# Patient Record
Sex: Female | Born: 1970 | Race: Black or African American | Hispanic: No | Marital: Married | Smoking: Never smoker
Health system: Southern US, Community
[De-identification: ages and names within clinical notes are randomized; demographics above are authoritative.]

## PROBLEM LIST (undated history)

## (undated) DIAGNOSIS — M1990 Unspecified osteoarthritis, unspecified site: Secondary | ICD-10-CM

## (undated) DIAGNOSIS — F419 Anxiety disorder, unspecified: Secondary | ICD-10-CM

## (undated) DIAGNOSIS — M719 Bursopathy, unspecified: Secondary | ICD-10-CM

## (undated) DIAGNOSIS — M545 Low back pain: Secondary | ICD-10-CM

## (undated) DIAGNOSIS — J189 Pneumonia, unspecified organism: Secondary | ICD-10-CM

## (undated) DIAGNOSIS — F32A Depression, unspecified: Secondary | ICD-10-CM

## (undated) DIAGNOSIS — M5430 Sciatica, unspecified side: Secondary | ICD-10-CM

## (undated) DIAGNOSIS — G8929 Other chronic pain: Secondary | ICD-10-CM

## (undated) DIAGNOSIS — F329 Major depressive disorder, single episode, unspecified: Secondary | ICD-10-CM

## (undated) HISTORY — PX: BACK SURGERY: SHX140

## (undated) HISTORY — PX: JOINT REPLACEMENT: SHX530

---

## 2004-06-28 ENCOUNTER — Ambulatory Visit: Payer: Self-pay | Admitting: Internal Medicine

## 2006-03-12 ENCOUNTER — Ambulatory Visit (HOSPITAL_COMMUNITY): Admission: RE | Admit: 2006-03-12 | Discharge: 2006-03-12 | Payer: Self-pay | Admitting: Obstetrics and Gynecology

## 2008-01-14 ENCOUNTER — Encounter: Admission: RE | Admit: 2008-01-14 | Discharge: 2008-01-14 | Payer: Self-pay | Admitting: Surgery

## 2008-12-21 ENCOUNTER — Inpatient Hospital Stay (HOSPITAL_COMMUNITY): Admission: AD | Admit: 2008-12-21 | Discharge: 2008-12-24 | Payer: Self-pay | Admitting: Obstetrics and Gynecology

## 2009-01-08 HISTORY — PX: APPENDECTOMY: SHX54

## 2009-12-07 ENCOUNTER — Ambulatory Visit (HOSPITAL_COMMUNITY)
Admission: EM | Admit: 2009-12-07 | Discharge: 2009-12-09 | Payer: Self-pay | Source: Home / Self Care | Admitting: Emergency Medicine

## 2009-12-07 ENCOUNTER — Ambulatory Visit (HOSPITAL_COMMUNITY)
Admission: RE | Admit: 2009-12-07 | Discharge: 2009-12-07 | Payer: Self-pay | Source: Home / Self Care | Admitting: Obstetrics and Gynecology

## 2009-12-08 ENCOUNTER — Encounter (INDEPENDENT_AMBULATORY_CARE_PROVIDER_SITE_OTHER): Payer: Self-pay | Admitting: General Surgery

## 2009-12-30 ENCOUNTER — Encounter
Admission: RE | Admit: 2009-12-30 | Discharge: 2009-12-30 | Payer: Self-pay | Source: Home / Self Care | Attending: General Surgery | Admitting: General Surgery

## 2010-01-29 ENCOUNTER — Encounter: Payer: Self-pay | Admitting: Obstetrics and Gynecology

## 2010-03-21 LAB — CBC
Hemoglobin: 12.9 g/dL (ref 12.0–15.0)
MCH: 33.9 pg (ref 26.0–34.0)
MCHC: 34 g/dL (ref 30.0–36.0)
MCV: 97.4 fL (ref 78.0–100.0)
MCV: 98.5 fL (ref 78.0–100.0)
Platelets: 317 10*3/uL (ref 150–400)
RBC: 3.8 MIL/uL — ABNORMAL LOW (ref 3.87–5.11)
RDW: 13 % (ref 11.5–15.5)
WBC: 10.7 10*3/uL — ABNORMAL HIGH (ref 4.0–10.5)

## 2010-03-21 LAB — URINE CULTURE
Colony Count: NO GROWTH
Culture  Setup Time: 201112011350
Special Requests: NEGATIVE

## 2010-03-21 LAB — URINALYSIS, ROUTINE W REFLEX MICROSCOPIC
Bilirubin Urine: NEGATIVE
Glucose, UA: NEGATIVE mg/dL
Ketones, ur: NEGATIVE mg/dL
Specific Gravity, Urine: 1.004 — ABNORMAL LOW (ref 1.005–1.030)
pH: 6.5 (ref 5.0–8.0)

## 2010-03-22 LAB — CBC
Hemoglobin: 15.3 g/dL — ABNORMAL HIGH (ref 12.0–15.0)
MCH: 34.2 pg — ABNORMAL HIGH (ref 26.0–34.0)
MCHC: 34.8 g/dL (ref 30.0–36.0)
Platelets: 335 10*3/uL (ref 150–400)
RDW: 13.1 % (ref 11.5–15.5)

## 2010-03-22 LAB — DIFFERENTIAL
Basophils Absolute: 0 10*3/uL (ref 0.0–0.1)
Basophils Relative: 0 % (ref 0–1)
Eosinophils Absolute: 0.1 10*3/uL (ref 0.0–0.7)
Monocytes Relative: 4 % (ref 3–12)
Neutro Abs: 6.6 10*3/uL (ref 1.7–7.7)
Neutrophils Relative %: 55 % (ref 43–77)

## 2010-03-22 LAB — POCT I-STAT, CHEM 8
Calcium, Ion: 1.13 mmol/L (ref 1.12–1.32)
Hemoglobin: 17 g/dL — ABNORMAL HIGH (ref 12.0–15.0)
Sodium: 137 mEq/L (ref 135–145)
TCO2: 28 mmol/L (ref 0–100)

## 2010-03-22 LAB — PROTIME-INR
INR: 0.92 (ref 0.00–1.49)
Prothrombin Time: 12.6 seconds (ref 11.6–15.2)

## 2010-04-10 LAB — CBC
MCV: 103.3 fL — ABNORMAL HIGH (ref 78.0–100.0)
Platelets: 207 10*3/uL (ref 150–400)
RBC: 3.41 MIL/uL — ABNORMAL LOW (ref 3.87–5.11)
WBC: 24.7 10*3/uL — ABNORMAL HIGH (ref 4.0–10.5)

## 2010-04-11 LAB — CBC
HCT: 40 % (ref 36.0–46.0)
MCV: 103 fL — ABNORMAL HIGH (ref 78.0–100.0)
Platelets: 241 10*3/uL (ref 150–400)
RBC: 3.88 MIL/uL (ref 3.87–5.11)
WBC: 17.8 10*3/uL — ABNORMAL HIGH (ref 4.0–10.5)

## 2010-04-11 LAB — RPR: RPR Ser Ql: NONREACTIVE

## 2011-01-05 ENCOUNTER — Encounter (HOSPITAL_COMMUNITY): Payer: Self-pay | Admitting: Pharmacy Technician

## 2011-01-08 ENCOUNTER — Ambulatory Visit (HOSPITAL_COMMUNITY)
Admission: RE | Admit: 2011-01-08 | Discharge: 2011-01-08 | Disposition: A | Discharge status: Home / Self Care | Payer: BC Managed Care – PPO | Source: Ambulatory Visit | Attending: Specialist | Admitting: Specialist

## 2011-01-08 ENCOUNTER — Encounter (HOSPITAL_COMMUNITY): Payer: Self-pay

## 2011-01-08 ENCOUNTER — Other Ambulatory Visit: Payer: Self-pay

## 2011-01-08 ENCOUNTER — Encounter (HOSPITAL_COMMUNITY)
Admission: RE | Admit: 2011-01-08 | Discharge: 2011-01-08 | Disposition: A | Discharge status: Home / Self Care | Payer: BC Managed Care – PPO | Source: Ambulatory Visit | Attending: Specialist | Admitting: Specialist

## 2011-01-08 DIAGNOSIS — Z01810 Encounter for preprocedural cardiovascular examination: Secondary | ICD-10-CM | POA: Insufficient documentation

## 2011-01-08 DIAGNOSIS — M899 Disorder of bone, unspecified: Secondary | ICD-10-CM | POA: Insufficient documentation

## 2011-01-08 DIAGNOSIS — Z01812 Encounter for preprocedural laboratory examination: Secondary | ICD-10-CM | POA: Insufficient documentation

## 2011-01-08 DIAGNOSIS — M949 Disorder of cartilage, unspecified: Secondary | ICD-10-CM | POA: Insufficient documentation

## 2011-01-08 DIAGNOSIS — M545 Low back pain, unspecified: Secondary | ICD-10-CM | POA: Insufficient documentation

## 2011-01-08 DIAGNOSIS — M79609 Pain in unspecified limb: Secondary | ICD-10-CM | POA: Insufficient documentation

## 2011-01-08 DIAGNOSIS — Z01818 Encounter for other preprocedural examination: Secondary | ICD-10-CM | POA: Insufficient documentation

## 2011-01-08 HISTORY — DX: Unspecified osteoarthritis, unspecified site: M19.90

## 2011-01-08 HISTORY — DX: Major depressive disorder, single episode, unspecified: F32.9

## 2011-01-08 HISTORY — DX: Depression, unspecified: F32.A

## 2011-01-08 HISTORY — DX: Anxiety disorder, unspecified: F41.9

## 2011-01-08 LAB — COMPREHENSIVE METABOLIC PANEL
ALT: 17 U/L (ref 0–35)
Alkaline Phosphatase: 76 U/L (ref 39–117)
CO2: 24 mEq/L (ref 19–32)
Chloride: 102 mEq/L (ref 96–112)
GFR calc Af Amer: 88 mL/min — ABNORMAL LOW (ref >90)
GFR calc non Af Amer: 76 mL/min — ABNORMAL LOW (ref >90)
Glucose, Bld: 91 mg/dL (ref 70–99)
Potassium: 4.2 mEq/L (ref 3.5–5.1)
Sodium: 136 mEq/L (ref 135–145)
Total Bilirubin: 0.3 mg/dL (ref 0.3–1.2)

## 2011-01-08 LAB — CBC
Hemoglobin: 14.1 g/dL (ref 12.0–15.0)
MCH: 32.8 pg (ref 26.0–34.0)
RBC: 4.3 MIL/uL (ref 3.87–5.11)
WBC: 16.1 10*3/uL — ABNORMAL HIGH (ref 4.0–10.5)

## 2011-01-08 LAB — URINALYSIS, ROUTINE W REFLEX MICROSCOPIC
Glucose, UA: NEGATIVE mg/dL
Hgb urine dipstick: NEGATIVE
Ketones, ur: NEGATIVE mg/dL
Protein, ur: NEGATIVE mg/dL
pH: 6 (ref 5.0–8.0)

## 2011-01-08 LAB — SURGICAL PCR SCREEN: Staphylococcus aureus: NEGATIVE

## 2011-01-08 LAB — APTT: aPTT: 31 seconds (ref 24–37)

## 2011-01-08 MED ORDER — CHLORHEXIDINE GLUCONATE 4 % EX LIQD: 60.0000 mL | Freq: Once | CUTANEOUS | Status: DC

## 2011-01-08 NOTE — Patient Instructions (Signed)
19 Judith Hardy  01/08/2011   Your procedure is scheduled on: WED 01/10/11  AT 10:30 AM  Report to [facility] at 8:30 AM.  Call this number if you have problems the morning of surgery: [phone]   Remember:   Do not eat food OR DRINK ANYTHING AFTER MIDNIGHT THE NIGHT BEFORE YOUR SURGERY.  .    Take these medicines the morning of surgery with A SIP OF WATER:CELEXA AND HYDROCODONE / ACETAMINOPHEN   Do not wear jewelry, make-up or nail polish.  Do not wear lotions, powders, or perfumes.   Do not shave 48 hours prior to surgery.  Do not bring valuables to the hospital.  Contacts, dentures or bridgework may not be worn into surgery.  Leave suitcase in the car. After surgery it may be brought to your room.  For patients admitted to the hospital, checkout time is 11:00 AM the day of discharge.   Patients discharged the day of surgery will not be allowed to drive home.    Special Instructions: CHG Shower Use Special Wash: 1/2 bottle night before surgery and 1/2 bottle morning of surgery.   Please read over the following fact sheets that you were given: MRSA Information AND INCENTIVE SPIROMETRY INSTRUCTIONS

## 2011-01-10 ENCOUNTER — Ambulatory Visit (HOSPITAL_COMMUNITY): Payer: BC Managed Care – PPO

## 2011-01-10 ENCOUNTER — Encounter (HOSPITAL_COMMUNITY)
Admission: RE | Disposition: A | Discharge status: Home / Self Care | Payer: Self-pay | Source: Ambulatory Visit | Attending: Specialist

## 2011-01-10 ENCOUNTER — Encounter (HOSPITAL_COMMUNITY): Payer: Self-pay

## 2011-01-10 ENCOUNTER — Other Ambulatory Visit: Payer: Self-pay | Admitting: Specialist

## 2011-01-10 ENCOUNTER — Encounter (HOSPITAL_COMMUNITY): Payer: Self-pay | Admitting: *Deleted

## 2011-01-10 ENCOUNTER — Ambulatory Visit (HOSPITAL_COMMUNITY)
Admission: RE | Admit: 2011-01-10 | Discharge: 2011-01-12 | Disposition: A | Discharge status: Home / Self Care | Payer: BC Managed Care – PPO | Source: Ambulatory Visit | Attending: Specialist | Admitting: Specialist

## 2011-01-10 ENCOUNTER — Ambulatory Visit (HOSPITAL_COMMUNITY): Payer: BC Managed Care – PPO | Admitting: *Deleted

## 2011-01-10 DIAGNOSIS — E049 Nontoxic goiter, unspecified: Secondary | ICD-10-CM | POA: Insufficient documentation

## 2011-01-10 DIAGNOSIS — M5126 Other intervertebral disc displacement, lumbar region: Secondary | ICD-10-CM | POA: Insufficient documentation

## 2011-01-10 DIAGNOSIS — M545 Low back pain, unspecified: Secondary | ICD-10-CM | POA: Insufficient documentation

## 2011-01-10 DIAGNOSIS — R29898 Other symptoms and signs involving the musculoskeletal system: Secondary | ICD-10-CM | POA: Insufficient documentation

## 2011-01-10 DIAGNOSIS — R209 Unspecified disturbances of skin sensation: Secondary | ICD-10-CM | POA: Insufficient documentation

## 2011-01-10 DIAGNOSIS — M1610 Unilateral primary osteoarthritis, unspecified hip: Secondary | ICD-10-CM | POA: Insufficient documentation

## 2011-01-10 DIAGNOSIS — Z01810 Encounter for preprocedural cardiovascular examination: Secondary | ICD-10-CM | POA: Insufficient documentation

## 2011-01-10 DIAGNOSIS — M549 Dorsalgia, unspecified: Secondary | ICD-10-CM | POA: Insufficient documentation

## 2011-01-10 DIAGNOSIS — M169 Osteoarthritis of hip, unspecified: Secondary | ICD-10-CM | POA: Insufficient documentation

## 2011-01-10 HISTORY — PX: LUMBAR LAMINECTOMY/DECOMPRESSION MICRODISCECTOMY: SHX5026

## 2011-01-10 SURGERY — LUMBAR LAMINECTOMY/DECOMPRESSION MICRODISCECTOMY
Anesthesia: General | Site: Back | Wound class: Clean

## 2011-01-10 MED ORDER — GABAPENTIN 300 MG PO CAPS
300.0000 mg | ORAL_CAPSULE | Freq: Three times a day (TID) | ORAL | Status: DC
  Administered 2011-01-10 – 2011-01-12 (×6): 300 mg via ORAL
  Filled 2011-01-10 (×8): qty 1

## 2011-01-10 MED ORDER — PHENOL 1.4 % MT LIQD: 1.0000 | OROMUCOSAL | Status: DC | PRN

## 2011-01-10 MED ORDER — GLYCOPYRROLATE 0.2 MG/ML IJ SOLN
INTRAMUSCULAR | Status: DC | PRN
  Administered 2011-01-10: .4 mg via INTRAVENOUS

## 2011-01-10 MED ORDER — DEXAMETHASONE SODIUM PHOSPHATE 10 MG/ML IJ SOLN
INTRAMUSCULAR | Status: DC | PRN
  Administered 2011-01-10: 10 mg via INTRAVENOUS

## 2011-01-10 MED ORDER — NEOSTIGMINE METHYLSULFATE 1 MG/ML IJ SOLN
INTRAMUSCULAR | Status: DC | PRN
  Administered 2011-01-10: 1 mg via INTRAVENOUS

## 2011-01-10 MED ORDER — LACTATED RINGERS IV SOLN: INTRAVENOUS | Status: DC

## 2011-01-10 MED ORDER — HYDROMORPHONE HCL PF 1 MG/ML IJ SOLN
0.5000 mg | INTRAMUSCULAR | Status: DC | PRN
  Administered 2011-01-10 – 2011-01-11 (×6): 1 mg via INTRAVENOUS
  Administered 2011-01-12: 0.5 mg via INTRAVENOUS
  Filled 2011-01-10 (×8): qty 1

## 2011-01-10 MED ORDER — SODIUM CHLORIDE 0.9 % IV SOLN: 250.0000 mL | INTRAVENOUS | Status: DC

## 2011-01-10 MED ORDER — PROPOFOL 10 MG/ML IV EMUL
INTRAVENOUS | Status: DC | PRN
  Administered 2011-01-10: 200 mg via INTRAVENOUS

## 2011-01-10 MED ORDER — CITALOPRAM HYDROBROMIDE 20 MG PO TABS
20.0000 mg | ORAL_TABLET | Freq: Every day | ORAL | Status: DC
Start: 2011-01-11 — End: 2011-01-12
  Administered 2011-01-11 – 2011-01-12 (×2): 20 mg via ORAL
  Filled 2011-01-10 (×3): qty 1

## 2011-01-10 MED ORDER — ACETAMINOPHEN 650 MG RE SUPP: 650.0000 mg | RECTAL | Status: DC | PRN

## 2011-01-10 MED ORDER — CYCLOBENZAPRINE HCL 10 MG PO TABS
10.0000 mg | ORAL_TABLET | Freq: Three times a day (TID) | ORAL | Status: DC | PRN
  Administered 2011-01-10 – 2011-01-12 (×4): 10 mg via ORAL
  Filled 2011-01-10 (×5): qty 1

## 2011-01-10 MED ORDER — ACETAMINOPHEN 325 MG PO TABS
650.0000 mg | ORAL_TABLET | ORAL | Status: DC | PRN
  Administered 2011-01-11 – 2011-01-12 (×3): 650 mg via ORAL
  Filled 2011-01-10 (×3): qty 2

## 2011-01-10 MED ORDER — SODIUM CHLORIDE 0.9 % IJ SOLN: 3.0000 mL | INTRAMUSCULAR | Status: DC | PRN

## 2011-01-10 MED ORDER — LACTATED RINGERS IV SOLN
INTRAVENOUS | Status: DC | PRN
  Administered 2011-01-10 (×2): via INTRAVENOUS

## 2011-01-10 MED ORDER — FENTANYL CITRATE 0.05 MG/ML IJ SOLN
INTRAMUSCULAR | Status: AC
  Filled 2011-01-10: qty 2

## 2011-01-10 MED ORDER — MENTHOL 3 MG MT LOZG
1.0000 | LOZENGE | OROMUCOSAL | Status: DC | PRN
  Filled 2011-01-10: qty 9

## 2011-01-10 MED ORDER — BUPIVACAINE-EPINEPHRINE 0.5% -1:200000 IJ SOLN
INTRAMUSCULAR | Status: DC | PRN
  Administered 2011-01-10: 15 mL

## 2011-01-10 MED ORDER — FENTANYL CITRATE 0.05 MG/ML IJ SOLN
INTRAMUSCULAR | Status: DC | PRN
  Administered 2011-01-10: 100 ug via INTRAVENOUS
  Administered 2011-01-10 (×5): 50 ug via INTRAVENOUS

## 2011-01-10 MED ORDER — ONDANSETRON HCL 4 MG/2ML IJ SOLN: 4.0000 mg | INTRAMUSCULAR | Status: DC | PRN

## 2011-01-10 MED ORDER — POTASSIUM CHLORIDE IN NACL 20-0.45 MEQ/L-% IV SOLN
INTRAVENOUS | Status: DC
  Administered 2011-01-11: 01:00:00 via INTRAVENOUS
  Filled 2011-01-10 (×7): qty 1000

## 2011-01-10 MED ORDER — ACETAMINOPHEN 10 MG/ML IV SOLN
INTRAVENOUS | Status: DC | PRN
  Administered 2011-01-10: 1000 mg via INTRAVENOUS

## 2011-01-10 MED ORDER — VITAMIN C 500 MG PO TABS
500.0000 mg | ORAL_TABLET | Freq: Two times a day (BID) | ORAL | Status: DC
  Administered 2011-01-10 – 2011-01-12 (×4): 500 mg via ORAL
  Filled 2011-01-10 (×5): qty 1

## 2011-01-10 MED ORDER — PANTOPRAZOLE SODIUM 40 MG IV SOLR
40.0000 mg | Freq: Every day | INTRAVENOUS | Status: DC
  Administered 2011-01-10: 40 mg via INTRAVENOUS
  Filled 2011-01-10 (×2): qty 40

## 2011-01-10 MED ORDER — SODIUM CHLORIDE 0.9 % IJ SOLN
3.0000 mL | Freq: Two times a day (BID) | INTRAMUSCULAR | Status: DC
  Administered 2011-01-11: 3 mL via INTRAVENOUS

## 2011-01-10 MED ORDER — EPHEDRINE SULFATE 50 MG/ML IJ SOLN
INTRAMUSCULAR | Status: DC | PRN
  Administered 2011-01-10 (×2): 5 mg via INTRAVENOUS

## 2011-01-10 MED ORDER — SUCCINYLCHOLINE CHLORIDE 20 MG/ML IJ SOLN
INTRAMUSCULAR | Status: DC | PRN
  Administered 2011-01-10: 100 mg via INTRAVENOUS

## 2011-01-10 MED ORDER — METHOCARBAMOL 100 MG/ML IJ SOLN
500.0000 mg | Freq: Four times a day (QID) | INTRAVENOUS | Status: DC | PRN
  Administered 2011-01-10 – 2011-01-11 (×2): 500 mg via INTRAVENOUS
  Filled 2011-01-10 (×2): qty 5

## 2011-01-10 MED ORDER — THROMBIN 5000 UNITS EX KIT
PACK | CUTANEOUS | Status: DC | PRN
  Administered 2011-01-10: 5000 [IU] via TOPICAL

## 2011-01-10 MED ORDER — LIDOCAINE HCL (CARDIAC) 20 MG/ML IV SOLN
INTRAVENOUS | Status: DC | PRN
  Administered 2011-01-10: 80 mg via INTRAVENOUS

## 2011-01-10 MED ORDER — ONDANSETRON HCL 4 MG/2ML IJ SOLN
INTRAMUSCULAR | Status: DC | PRN
  Administered 2011-01-10 (×2): 2 mg via INTRAVENOUS

## 2011-01-10 MED ORDER — CISATRACURIUM BESYLATE 2 MG/ML IV SOLN
INTRAVENOUS | Status: DC | PRN
  Administered 2011-01-10: 6 mg via INTRAVENOUS
  Administered 2011-01-10: 2 mg via INTRAVENOUS

## 2011-01-10 MED ORDER — HYDROMORPHONE HCL PF 1 MG/ML IJ SOLN
0.2500 mg | INTRAMUSCULAR | Status: DC | PRN
  Administered 2011-01-10 (×2): 0.5 mg via INTRAVENOUS
  Administered 2011-01-10: 14:00:00 via INTRAVENOUS
  Administered 2011-01-10: 0.5 mg via INTRAVENOUS

## 2011-01-10 MED ORDER — CEFAZOLIN SODIUM 1-5 GM-% IV SOLN
1.0000 g | Freq: Three times a day (TID) | INTRAVENOUS | Status: AC
  Administered 2011-01-10 – 2011-01-11 (×2): 1 g via INTRAVENOUS
  Filled 2011-01-10 (×2): qty 50

## 2011-01-10 MED ORDER — LACTATED RINGERS IV SOLN
INTRAVENOUS | Status: DC
Start: 2011-01-10 — End: 2011-01-10
  Administered 2011-01-10: 14:00:00 via INTRAVENOUS
  Administered 2011-01-10: 1000 mL via INTRAVENOUS

## 2011-01-10 MED ORDER — SODIUM CHLORIDE 0.9 % IR SOLN
Status: DC | PRN
  Administered 2011-01-10: 12:00:00

## 2011-01-10 MED ORDER — OXYCODONE HCL 5 MG PO TABS
15.0000 mg | ORAL_TABLET | ORAL | Status: DC | PRN
  Administered 2011-01-10 – 2011-01-11 (×3): 15 mg via ORAL
  Filled 2011-01-10 (×3): qty 3

## 2011-01-10 MED ORDER — CEFAZOLIN SODIUM 1-5 GM-% IV SOLN
INTRAVENOUS | Status: DC | PRN
  Administered 2011-01-10: 2 g via INTRAVENOUS

## 2011-01-10 MED ORDER — PROMETHAZINE HCL 25 MG/ML IJ SOLN: 6.2500 mg | INTRAMUSCULAR | Status: DC | PRN

## 2011-01-10 MED ORDER — MIDAZOLAM HCL 5 MG/5ML IJ SOLN
INTRAMUSCULAR | Status: DC | PRN
  Administered 2011-01-10 (×2): 1 mg via INTRAVENOUS

## 2011-01-10 MED ORDER — FENTANYL CITRATE 0.05 MG/ML IJ SOLN
50.0000 ug | INTRAMUSCULAR | Status: DC | PRN
  Administered 2011-01-10 (×3): 50 ug via INTRAVENOUS

## 2011-01-10 MED ORDER — HYDROMORPHONE HCL PF 1 MG/ML IJ SOLN
INTRAMUSCULAR | Status: AC
  Filled 2011-01-10: qty 1

## 2011-01-10 SURGICAL SUPPLY — 55 items
APL SKNCLS STERI-STRIP NONHPOA (GAUZE/BANDAGES/DRESSINGS) ×1
BAG SPEC THK2 15X12 ZIP CLS (MISCELLANEOUS) ×1
BAG ZIPLOCK 12X15 (MISCELLANEOUS) ×2 IMPLANT
BENZOIN TINCTURE PRP APPL 2/3 (GAUZE/BANDAGES/DRESSINGS) ×3 IMPLANT
CHLORAPREP W/TINT 26ML (MISCELLANEOUS) IMPLANT
CLEANER TIP ELECTROSURG 2X2 (MISCELLANEOUS) ×2 IMPLANT
CLOSURE STERI STRIP 1/2 X4 (GAUZE/BANDAGES/DRESSINGS) ×1 IMPLANT
CLOTH BEACON ORANGE TIMEOUT ST (SAFETY) ×2 IMPLANT
DECANTER SPIKE VIAL GLASS SM (MISCELLANEOUS) ×2 IMPLANT
DRAPE MICROSCOPE LEICA (MISCELLANEOUS) ×2 IMPLANT
DRAPE POUCH INSTRU U-SHP 10X18 (DRAPES) ×2 IMPLANT
DRAPE SURG 17X11 SM STRL (DRAPES) ×2 IMPLANT
DRSG COVERLET 3X3 (GAUZE/BANDAGES/DRESSINGS) ×1 IMPLANT
DRSG EMULSION OIL 3X3 NADH (GAUZE/BANDAGES/DRESSINGS) IMPLANT
DRSG PAD ABDOMINAL 8X10 ST (GAUZE/BANDAGES/DRESSINGS) IMPLANT
DRSG TELFA 4X5 ISLAND ADH (GAUZE/BANDAGES/DRESSINGS) IMPLANT
DURAPREP 26ML APPLICATOR (WOUND CARE) ×2 IMPLANT
ELECT BLADE TIP CTD 4 INCH (ELECTRODE) ×1 IMPLANT
ELECT REM PT RETURN 9FT ADLT (ELECTROSURGICAL) ×2
ELECTRODE REM PT RTRN 9FT ADLT (ELECTROSURGICAL) ×1 IMPLANT
GLOVE BIOGEL PI IND STRL 6.5 (GLOVE) ×1 IMPLANT
GLOVE BIOGEL PI IND STRL 8 (GLOVE) ×1 IMPLANT
GLOVE BIOGEL PI INDICATOR 6.5 (GLOVE)
GLOVE BIOGEL PI INDICATOR 8 (GLOVE) ×1
GLOVE ECLIPSE 6.5 STRL STRAW (GLOVE) ×1 IMPLANT
GLOVE INDICATOR 6.5 STRL GRN (GLOVE) ×2 IMPLANT
GLOVE SURG SS PI 8.0 STRL IVOR (GLOVE) ×4 IMPLANT
KIT BASIN OR (CUSTOM PROCEDURE TRAY) ×2 IMPLANT
KIT POSITIONING SURG ANDREWS (MISCELLANEOUS) ×2 IMPLANT
MANIFOLD NEPTUNE II (INSTRUMENTS) ×2 IMPLANT
NDL SAFETY ECLIPSE 18X1.5 (NEEDLE) IMPLANT
NDL SPNL 18GX3.5 QUINCKE PK (NEEDLE) ×3 IMPLANT
NEEDLE HYPO 18GX1.5 SHARP (NEEDLE) ×2
NEEDLE SPNL 18GX3.5 QUINCKE PK (NEEDLE) ×6 IMPLANT
PATTIES SURGICAL .5 X.5 (GAUZE/BANDAGES/DRESSINGS) IMPLANT
PATTIES SURGICAL .75X.75 (GAUZE/BANDAGES/DRESSINGS) IMPLANT
PATTIES SURGICAL 1X1 (DISPOSABLE) IMPLANT
SPONGE SURGIFOAM ABS GEL 100 (HEMOSTASIS) ×2 IMPLANT
STAPLER VISISTAT (STAPLE) IMPLANT
STRIP CLOSURE SKIN 1/2X4 (GAUZE/BANDAGES/DRESSINGS) IMPLANT
SUT PROLENE 3 0 PS 2 (SUTURE) IMPLANT
SUT VIC AB 0 CT1 27 (SUTURE)
SUT VIC AB 0 CT1 27XBRD ANTBC (SUTURE) IMPLANT
SUT VIC AB 1 CT1 27 (SUTURE) ×2
SUT VIC AB 1 CT1 27XBRD ANTBC (SUTURE) ×1 IMPLANT
SUT VIC AB 1-0 CT2 27 (SUTURE) IMPLANT
SUT VIC AB 2-0 CT1 27 (SUTURE) ×2
SUT VIC AB 2-0 CT1 TAPERPNT 27 (SUTURE) ×1 IMPLANT
SUT VIC AB 2-0 CT2 27 (SUTURE) ×4 IMPLANT
SUT VICRYL 0 27 CT2 27 ABS (SUTURE) ×4 IMPLANT
SUT VICRYL 0 UR6 27IN ABS (SUTURE) IMPLANT
SYR 20CC LL (SYRINGE) ×1 IMPLANT
SYRINGE 10CC LL (SYRINGE) ×4 IMPLANT
TRAY LAMINECTOMY (CUSTOM PROCEDURE TRAY) ×2 IMPLANT
YANKAUER SUCT BULB TIP NO VENT (SUCTIONS) ×2 IMPLANT

## 2011-01-10 NOTE — Anesthesia Postprocedure Evaluation (Signed)
  Anesthesia Post-op Note  Patient: Judith Hardy  Procedure(s) Performed:  LUMBAR LAMINECTOMY/DECOMPRESSION MICRODISCECTOMY - Decompression L4 - L5  (X-Ray)  Patient Location: PACU  Anesthesia Type: General  Level of Consciousness: awake and alert   Airway and Oxygen Therapy: Patient Spontanous Breathing  Post-op Pain: mild  Post-op Assessment: Post-op Vital signs reviewed, Patient's Cardiovascular Status Stable, Respiratory Function Stable, Patent Airway and No signs of Nausea or vomiting  Post-op Vital Signs: stable  Complications: No apparent anesthesia complications

## 2011-01-10 NOTE — Preoperative (Signed)
Beta Blockers   Reason not to administer Beta Blockers:Not Applicable 

## 2011-01-10 NOTE — Brief Op Note (Signed)
01/10/2011  1:10 PM  PATIENT:  Judith Hardy  41 y.o. female  PRE-OPERATIVE DIAGNOSIS:  Herniated Disc  POST-OPERATIVE DIAGNOSIS:  Herniated Disc  PROCEDURE:  Procedure(s): LUMBAR LAMINECTOMY/DECOMPRESSION MICRODISCECTOMY  SURGEON:  Surgeon(s): Tinnie Gens C Francessca Friis Dahari D Brooks  PHYSICIAN ASSISTANT:   ASSISTANTS: Brooks   ANESTHESIA:   general  EBL:  Total I/O In: 1000 [I.V.:1000] Out: 20 [Blood:20]  BLOOD ADMINISTERED:none  DRAINS: none   LOCAL MEDICATIONS USED:  MARCAINE 20CC  SPECIMEN:  No Specimen and Source of Specimen:  L45  DISPOSITION OF SPECIMEN:  PATHOLOGY  COUNTS:  YES  TOURNIQUET:  * No tourniquets in log *  DICTATION: .Other Dictation: Dictation Number [id number]  PLAN OF CARE: Admit for overnight observation  PATIENT DISPOSITION:  PACU - hemodynamically stable.   Delay start of Pharmacological VTE agent (>24hrs) due to surgical blood loss or risk of bleeding:  {YES/NO/NOT APPLICABLE:20182

## 2011-01-10 NOTE — Anesthesia Preprocedure Evaluation (Signed)
Anesthesia Evaluation  Patient identified by MRN, date of birth, ID band Patient awake    Reviewed: Allergy & Precautions, H&P , NPO status , Patient's Chart, lab work & pertinent test results, reviewed documented beta blocker date and time   Airway Mallampati: I TM Distance: >3 FB Neck ROM: Full    Dental  (+) Teeth Intact   Pulmonary neg pulmonary ROS,  clear to auscultation        Cardiovascular neg cardio ROS Regular Normal Denies cardiac symptoms   Neuro/Psych Negative Neurological ROS  Negative Psych ROS   GI/Hepatic negative GI ROS, Neg liver ROS,   Endo/Other  Negative Endocrine ROS  Renal/GU negative Renal ROS  Genitourinary negative   Musculoskeletal negative musculoskeletal ROS (+)   Abdominal   Peds negative pediatric ROS (+)  Hematology negative hematology ROS (+)   Anesthesia Other Findings   Reproductive/Obstetrics negative OB ROS                           Anesthesia Physical Anesthesia Plan  ASA: I  Anesthesia Plan: General   Post-op Pain Management:    Induction: Intravenous  Airway Management Planned: Oral ETT  Additional Equipment:   Intra-op Plan:   Post-operative Plan: Extubation in OR  Informed Consent: I have reviewed the patients History and Physical, chart, labs and discussed the procedure including the risks, benefits and alternatives for the proposed anesthesia with the patient or authorized representative who has indicated his/her understanding and acceptance.     Plan Discussed with: CRNA and Surgeon  Anesthesia Plan Comments:         Anesthesia Quick Evaluation

## 2011-01-10 NOTE — Transfer of Care (Signed)
Immediate Anesthesia Transfer of Care Note  Patient: Judith Hardy  Procedure(s) Performed:  LUMBAR LAMINECTOMY/DECOMPRESSION MICRODISCECTOMY - Decompression L4 - L5  (X-Ray)  Patient Location: PACU  Anesthesia Type: General  Level of Consciousness: awake, alert , oriented and patient cooperative  Airway & Oxygen Therapy: Patient Spontanous Breathing and Patient connected to face mask oxygen  Post-op Assessment: Report given to PACU RN, Post -op Vital signs reviewed and stable and Patient moving all extremities X 4  Post vital signs: Reviewed and stable  Complications: No apparent anesthesia complications

## 2011-01-10 NOTE — H&P (Signed)
Judith Hardy is an 41 y.o. female.   Chief Complaint: left leg pain HPI: HNP L45 left  Past Medical History  Diagnosis Date  . Thyroid enlarged     PT STATES TOLD HER THYROID ENLARGED--BUT NO TX.  PT TAKES OTC THYROID MEDICATION  . Arthritis     OA LEFT HIP  . Anxiety   . Depression     Past Surgical History  Procedure Date  . Appendectomy 2011    History reviewed. No pertinent family history. Social History:  reports that she has never smoked. She has never used smokeless tobacco. She reports that she does not drink alcohol or use illicit drugs.  Allergies: No Known Allergies  Medications Prior to Admission  Medication Dose Route Frequency Provider Last Rate Last Dose  . lactated ringers infusion   Intravenous Continuous Jill Side, MD 100 mL/hr at 01/10/11 0951 1,000 mL at 01/10/11 0951   No current outpatient prescriptions on file as of 01/10/2011.    Results for orders placed during the hospital encounter of 01/08/11 (from the past 48 hour(s))  SURGICAL PCR SCREEN     Status: Normal   Collection Time   01/08/11  1:08 PM      Component Value Range Comment   MRSA, PCR NEGATIVE  NEGATIVE     Staphylococcus aureus NEGATIVE  NEGATIVE    URINALYSIS, ROUTINE W REFLEX MICROSCOPIC     Status: Normal   Collection Time   01/08/11  1:32 PM      Component Value Range Comment   Color, Urine YELLOW  YELLOW     APPearance CLEAR  CLEAR     Specific Gravity, Urine 1.028  1.005 - 1.030     pH 6.0  5.0 - 8.0     Glucose, UA NEGATIVE  NEGATIVE (mg/dL)    Hgb urine dipstick NEGATIVE  NEGATIVE     Bilirubin Urine NEGATIVE  NEGATIVE     Ketones, ur NEGATIVE  NEGATIVE (mg/dL)    Protein, ur NEGATIVE  NEGATIVE (mg/dL)    Urobilinogen, UA 0.2  0.0 - 1.0 (mg/dL)    Nitrite NEGATIVE  NEGATIVE     Leukocytes, UA NEGATIVE  NEGATIVE  MICROSCOPIC NOT DONE ON URINES WITH NEGATIVE PROTEIN, BLOOD, LEUKOCYTES, NITRITE, OR GLUCOSE <1000 mg/dL.  APTT     Status: Normal   Collection Time   01/08/11  1:40 PM      Component Value Range Comment   aPTT 31  24 - 37 (seconds)   CBC     Status: Abnormal   Collection Time   01/08/11  1:40 PM      Component Value Range Comment   WBC 16.1 (*) 4.0 - 10.5 (K/uL)    RBC 4.30  3.87 - 5.11 (MIL/uL)    Hemoglobin 14.1  12.0 - 15.0 (g/dL)    HCT 16.1  09.6 - 04.5 (%)    MCV 96.0  78.0 - 100.0 (fL)    MCH 32.8  26.0 - 34.0 (pg)    MCHC 34.1  30.0 - 36.0 (g/dL)    RDW 40.9  81.1 - 91.4 (%)    Platelets 332  150 - 400 (K/uL)   COMPREHENSIVE METABOLIC PANEL     Status: Abnormal   Collection Time   01/08/11  1:40 PM      Component Value Range Comment   Sodium 136  135 - 145 (mEq/L)    Potassium 4.2  3.5 - 5.1 (mEq/L)    Chloride 102  96 -  112 (mEq/L)    CO2 24  19 - 32 (mEq/L)    Glucose, Bld 91  70 - 99 (mg/dL)    BUN 14  6 - 23 (mg/dL)    Creatinine, Ser 6.21  0.50 - 1.10 (mg/dL)    Calcium 9.5  8.4 - 10.5 (mg/dL)    Total Protein 7.9  6.0 - 8.3 (g/dL)    Albumin 3.8  3.5 - 5.2 (g/dL)    AST 16  0 - 37 (U/L)    ALT 17  0 - 35 (U/L)    Alkaline Phosphatase 76  39 - 117 (U/L)    Total Bilirubin 0.3  0.3 - 1.2 (mg/dL)    GFR calc non Af Amer 76 (*) >90 (mL/min)    GFR calc Af Amer 88 (*) >90 (mL/min)   PROTIME-INR     Status: Normal   Collection Time   01/08/11  1:40 PM      Component Value Range Comment   Prothrombin Time 12.9  11.6 - 15.2 (seconds)    INR 0.95  0.00 - 1.49    HCG, SERUM, QUALITATIVE     Status: Normal   Collection Time   01/08/11  1:40 PM      Component Value Range Comment   Preg, Serum NEGATIVE  NEGATIVE     Dg Chest 2 View  01/08/2011  *RADIOLOGY REPORT*  Clinical Data: Preoperative assessment.  Back pain.  CHEST - 2 VIEW  Comparison: 12/30/2009 CT scan of the abdomen  Findings: 12 pairs of ribs noted.  Cardiac and mediastinal contours appear normal.  The lungs appear clear.  No pleural effusion is identified.  IMPRESSION:  1.  No significant abnormality identified.  Original Report Authenticated By:  Dellia Cloud, M.D.   Dg Lumbar Spine 2-3 Views  01/08/2011  *RADIOLOGY REPORT*  Clinical Data: Back pain extending into the left leg.  LUMBAR SPINE - 2-3 VIEW  Comparison: CT scan dated 12/30/2009  Findings: There are five lumbar-type non-rib bearing vertebra which demonstrate normal alignment and normal intervertebral disc spacing.  No fracture or acute bony findings noted.  An IUD projects over the anatomic pelvis.  There is abnormal subcortical sclerosis in the left acetabular roof compatible with degenerative findings.  IMPRESSION:  1.  Five lumbar-type vertebral bodies demonstrate normal alignment. 2.  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 3.  IUD noted.  Original Report Authenticated By: Dellia Cloud, M.D.    Review of Systems  Constitutional: Negative.   HENT: Negative.   Eyes: Negative.   Respiratory: Negative.   Cardiovascular: Negative.   Gastrointestinal: Negative.   Genitourinary: Negative.   Musculoskeletal: Positive for back pain.  Skin: Negative.   Neurological: Positive for sensory change and focal weakness.  Endo/Heme/Allergies: Negative.   Psychiatric/Behavioral: Negative.     Blood pressure 111/66, pulse 64, temperature 98.3 F (36.8 C), temperature source Oral, resp. rate 18, last menstrual period 06/09/2008, SpO2 99.00%. Physical Exam  Constitutional: She is oriented to person, place, and time. She appears well-developed.  HENT:  Head: Normocephalic.  Eyes: Pupils are equal, round, and reactive to light.  Neck: Normal range of motion.  Cardiovascular: Normal rate.   Respiratory: Effort normal.  GI: Soft.  Musculoskeletal: She exhibits tenderness.  Neurological: She is alert and oriented to person, place, and time. She displays abnormal reflex.       Quad 4/5 left. Decreased sensation L4.  Skin: Skin is warm.  Psychiatric: She has a normal mood and  affect.     Assessment/Plan HNP L45 refractory. Plan decompression L45. Risks  discussed  Skylur Fuston C 01/10/2011, 11:14 AM

## 2011-01-11 ENCOUNTER — Encounter (HOSPITAL_COMMUNITY): Payer: Self-pay | Admitting: Specialist

## 2011-01-11 MED ORDER — PANTOPRAZOLE SODIUM 40 MG PO TBEC
40.0000 mg | DELAYED_RELEASE_TABLET | Freq: Every day | ORAL | Status: DC
  Administered 2011-01-12: 40 mg via ORAL
  Filled 2011-01-11: qty 1

## 2011-01-11 MED ORDER — OXYCODONE HCL 5 MG PO TABS
15.0000 mg | ORAL_TABLET | ORAL | Status: DC | PRN
  Administered 2011-01-11 – 2011-01-12 (×9): 15 mg via ORAL
  Filled 2011-01-11 (×9): qty 3

## 2011-01-11 MED ORDER — CLONAZEPAM 0.5 MG PO TABS
0.5000 mg | ORAL_TABLET | Freq: Every evening | ORAL | Status: DC | PRN
  Administered 2011-01-11 (×2): 0.5 mg via ORAL
  Filled 2011-01-11 (×2): qty 1

## 2011-01-11 NOTE — Progress Notes (Signed)
Subjective: 1 Day Post-Op Procedure(s) (LRB): LUMBAR LAMINECTOMY/DECOMPRESSION MICRODISCECTOMY (N/A) Patient reports pain as 10 on 0-10 scale.  She notes improvement in LE pain but low back pain as expected.  She has been out of bed with PT. Denies CP or SOB.  Voiding without difficulty. Positive flatus. Objective: Vital signs in last 24 hours: Temp:  [97.4 F (36.3 C)-98.7 F (37.1 C)] 98.7 F (37.1 C) (01/03 0357) Pulse Rate:  [65-86] 68  (01/03 0357) Resp:  [14-20] 17  (01/03 0357) BP: (114-133)/(62-84) 119/65 mmHg (01/03 0357) SpO2:  [98 %-100 %] 98 % (01/03 0357) Weight:  [83.915 kg (185 lb)] 185 lb (83.915 kg) (01/02 2016)  Intake/Output from previous day: 01/02 0701 - 01/03 0700 In: 4175 [P.O.:1200; I.V.:2925; IV Piggyback:50] Out: 571 [Urine:551; Blood:20] Intake/Output this shift:     Basename 01/08/11 1340  HGB 14.1    Basename 01/08/11 1340  WBC 16.1*  RBC 4.30  HCT 41.3  PLT 332    Basename 01/08/11 1340  NA 136  K 4.2  CL 102  CO2 24  BUN 14  CREATININE 0.92  GLUCOSE 91  CALCIUM 9.5    Basename 01/08/11 1340  LABPT --  INR 0.95    Neurologically intact ABD soft Sensation intact distally Intact pulses distally Incision: dressing C/D/I Compartment soft  Assessment/Plan: 1 Day Post-Op Procedure(s) (LRB): LUMBAR LAMINECTOMY/DECOMPRESSION MICRODISCECTOMY (N/A) Pt stable for discharge home today once completes stairs with PT Discharge discussed with pt by Dr Avel Sensor R. 01/11/2011, 10:12 AM

## 2011-01-11 NOTE — Progress Notes (Signed)
Occupational Therapy Evaluation Patient Details Name: Gae Bihl MRN: [id number] DOB: 08-21-1970 Today's Date: 01/11/2011  Problem List: There is no problem list on file for this patient. EV1 [id number]  Past Medical History:  Past Medical History  Diagnosis Date  . Thyroid enlarged     PT STATES TOLD HER THYROID ENLARGED--BUT NO TX.  PT TAKES OTC THYROID MEDICATION  . Arthritis     OA LEFT HIP  . Anxiety   . Depression    Past Surgical History:  Past Surgical History  Procedure Date  . Appendectomy 2011    OT Assessment/Plan/Recommendation OT Assessment Clinical Impression Statement: Pt doing well despte describing 10/10 pain in low back throughout eval. Pt will have necessary level of A at home from caregivers. No f/u OT needed. Recommend 3:1 for home use. OT Recommendation/Assessment: Patient does not need any further OT services OT Recommendation Equipment Recommended: 3 in 1 bedside comode OT Goals    OT Evaluation Precautions/Restrictions  Precautions Precautions: Back Precaution Booklet Issued: Yes (comment) Required Braces or Orthoses: No Restrictions Weight Bearing Restrictions: No Prior Functioning Home Living Lives With: Spouse (will be alone during the day) then stated family could stay with her during the day. Receives Help From: Family Type of Home: House Home Layout: Two level Alternate Level Stairs-Number of Steps: 16 Home Access: Stairs to enter Entrance Stairs-Rails: Doctor, general practice of Steps: 6 Bathroom Shower/Tub: Engineer, manufacturing systems: Standard Home Adaptive Equipment: Straight cane Prior Function Level of Independence: Independent with basic ADLs;Independent with homemaking with ambulation Able to Take Stairs?: Yes Driving: Yes Vocation: Full time employment ADL ADL Grooming: Performed;Wash/dry hands;Wash/dry face;Modified independent Where Assessed - Grooming: Standing at sink Upper Body Bathing:  Performed;Modified independent Where Assessed - Upper Body Bathing: Standing at sink Lower Body Bathing: Supervision/safety;Performed Where Assessed - Lower Body Bathing: Other (comment) (sit<>stand from 3:1) Upper Body Dressing: Performed;Modified independent Where Assessed - Upper Body Dressing: Standing Lower Body Dressing: Simulated;Supervision/safety Where Assessed - Lower Body Dressing: Other (comment) (sit<>stand from 3:1) Toilet Transfer: Performed Toilet Transfer Method: Proofreader: Raised toilet seat with arms (or 3-in-1 over toilet) Toileting - Clothing Manipulation: Performed;Modified independent Where Assessed - Toileting Clothing Manipulation: Sit to stand from 3-in-1 or toilet Toileting - Hygiene: Performed;Modified independent Where Assessed - Toileting Hygiene: Sit to stand from 3-in-1 or toilet Tub/Shower Transfer: Not assessed Tub/Shower Transfer Method: Not assessed Equipment Used: Rolling walker ADL Comments: Discussed with patient back precautions with regards to ADLs. Pt with good return demo. Stated she would have necessary level of A at home. Declined education in use of AE. Vision/Perception    Cognition Cognition Arousal/Alertness: Awake/alert Overall Cognitive Status: Appears within functional limits for tasks assessed Orientation Level: Oriented X4 Sensation/Coordination Sensation Light Touch: Appears Intact Stereognosis: Appears Intact Hot/Cold: Appears Intact Proprioception: Appears Intact Coordination Gross Motor Movements are Fluid and Coordinated: Yes Fine Motor Movements are Fluid and Coordinated: Yes Extremity Assessment RUE Assessment RUE Assessment: Within Functional Limits LUE Assessment LUE Assessment: Within Functional Limits Mobility  Bed Mobility Bed Mobility: Yes Rolling Left: 4: Min assist Rolling Left Details (indicate cue type and reason): min with LLE knee flexion; cues for back precautions Left  Sidelying to Sit: Other (comment);HOB flat (minguard A with cues for precautions.) Left Sidelying to Sit Details (indicate cue type and reason): cues for precautions Transfers Transfers: Yes Sit to Stand: Other (comment) (minguard A initially then supervision with RW.) Sit to Stand Details (indicate cue type and reason): Occasional VCs for precautions.  Stand to Sit: With upper extremity assist;With armrests;To chair/3-in-1;Other (comment) (minguard A initially then supervision) Stand to Sit Details: Occasional VCs for precautions. Exercises   End of Session OT - End of Session Equipment Utilized During Treatment: Other (comment) (RW, 3:1) Activity Tolerance: Patient tolerated treatment well;Patient limited by pain Patient left: in chair;with call bell in reach General Behavior During Session: [facility] for tasks performed Cognition: [facility] for tasks performed   Makaveli Hoard A [phone] 01/11/2011, 9:40 AM

## 2011-01-11 NOTE — Progress Notes (Signed)
Both CSW and RNCM consulted for d/c planning. PT has recommended HHPT upon d/c. RNCM will assist with HH needs . CSW is available to assist pt if plan changes and SNF is needed.

## 2011-01-11 NOTE — Progress Notes (Signed)
PT Cancellation Note  _PT deferred this pm due pt with 10/10 pain.  Will attempt stairs in am.

## 2011-01-11 NOTE — Progress Notes (Signed)
Physical Therapy Evaluation Patient Details Name: Judith Hardy MRN: [id number] DOB: 06/15/1970 Today's Date: 01/11/2011 [id number] eval 2  Problem List: There is no problem list on file for this patient.   Past Medical History:  Past Medical History  Diagnosis Date  . Thyroid enlarged     PT STATES TOLD HER THYROID ENLARGED--BUT NO TX.  PT TAKES OTC THYROID MEDICATION  . Arthritis     OA LEFT HIP  . Anxiety   . Depression    Past Surgical History:  Past Surgical History  Procedure Date  . Appendectomy 2011    PT Assessment/Plan/Recommendation PT Assessment Clinical Impression Statement: pt will benefit from PT to increase activity and independence prior to home PT Recommendation/Assessment: Patient will need skilled PT in the acute care venue PT Problem List: Decreased activity tolerance;Decreased mobility;Decreased knowledge of use of DME;Decreased knowledge of precautions;Pain PT Therapy Diagnosis : Difficulty walking PT Plan PT Frequency: Min 6X/week PT Treatment/Interventions: DME instruction;Gait training;Stair training;Functional mobility training;Therapeutic activities;Patient/family education PT Recommendation Follow Up Recommendations: Home health PT (safety eval) Equipment Recommended: Rolling walker with 5" wheels;3 in 1 bedside comode PT Goals  Acute Rehab PT Goals PT Goal Formulation: With patient Time For Goal Achievement: 2 days Pt will Roll Supine to Right Side: with supervision PT Goal: Rolling Supine to Right Side - Progress: Progressing toward goal Pt will go Supine/Side to Sit: with supervision;with HOB 0 degrees Pt will go Sit to Stand: with modified independence PT Goal: Sit to Stand - Progress: Progressing toward goal Pt will go Stand to Sit: with modified independence PT Goal: Stand to Sit - Progress: Progressing toward goal Pt will Ambulate: 51 - 150 feet;with modified independence;with rolling walker PT Goal: Ambulate - Progress: Progressing  toward goal Pt will Go Up / Down Stairs: 6-9 stairs;with min assist;with least restrictive assistive device;with rail(s) PT Goal: Up/Down Stairs - Progress: Not met  PT Evaluation Precautions/Restrictions  Precautions Precautions: Back Required Braces or Orthoses: No Prior Functioning  Home Living Lives With: Spouse (will be alone during the day) Receives Help From: Family Type of Home: House Home Layout: Two level Alternate Level Stairs-Number of Steps: 16 Home Access: Stairs to enter Entrance Stairs-Rails: Doctor, general practice of Steps: 6 Bathroom Shower/Tub: Engineer, manufacturing systems: Standard Home Adaptive Equipment: Straight cane Prior Function Level of Independence: Independent with basic ADLs;Independent with homemaking with ambulation Able to Take Stairs?: Yes Driving: Yes Vocation: Full time employment Cognition Cognition Arousal/Alertness: Awake/alert Overall Cognitive Status: Appears within functional limits for tasks assessed Orientation Level: Oriented X4 Sensation/Coordination Sensation Light Touch: Appears Intact Extremity Assessment RLE Assessment RLE Assessment: Within Functional Limits LLE Assessment LLE Assessment:  (grossly 3+/5 to 4/5; pt c/o feeling weak) Mobility (including Balance) Bed Mobility Bed Mobility: Yes Rolling Left: 4: Min assist Rolling Left Details (indicate cue type and reason): min with LLE knee flexion; cues for back precautions Left Sidelying to Sit: 4: Min assist (min guard) Left Sidelying to Sit Details (indicate cue type and reason): cues for precautions Transfers Transfers: Yes Sit to Stand: 4: Min assist;From bed Sit to Stand Details (indicate cue type and reason): cues for hands and safety Stand to Sit: 4: Min assist;With armrests;With upper extremity assist;To chair/3-in-1 (min/guard) Stand to Sit Details: cues for hands Ambulation/Gait Ambulation/Gait: Yes Ambulation/Gait Assistance:   (min/guard) Ambulation Distance (Feet): 240 Feet Assistive device: Rolling walker Gait Pattern: Step-to pattern;Antalgic    Exercise    End of Session PT - End of Session Activity Tolerance: Patient limited by  pain Patient left: in chair;with call bell in reach Nurse Communication: Mobility status for ambulation General Behavior During Session: [facility] for tasks performed Cognition: [facility] for tasks performed  [facility] 01/11/2011, 9:21 AM

## 2011-01-11 NOTE — Op Note (Signed)
NAMEMARLEENA, Judith Hardy               ACCOUNT NO.:  [id number]  MEDICAL RECORD NO.:  [id number]  LOCATION:  1604                         FACILITY:  [facility]  PHYSICIAN:  Jene Every, M.D.    DATE OF BIRTH:  Jan 31, 1970  DATE OF PROCEDURE:  01/10/2011 DATE OF DISCHARGE:                              OPERATIVE REPORT   PREOPERATIVE DIAGNOSIS:  Extruded fragment at L4-5, compressing the L4 nerve root.  POSTOPERATIVE DIAGNOSIS:  Extruded fragment at L4-5, compressing the L4 nerve root.  PROCEDURES PERFORMED: 1. Lumbar decompression at L4-5 and foraminotomies at L4 and L5. 2. Retrieval of free fragment at L4-5. 3. Lysis of adhesions, epidural venous plexus.  ANESTHESIA:  General.  ASSISTANT:  Dahari D. Shon Baton, MD  BRIEF HISTORY:  This is a 41 year old female with extruded fragment at L4-5; myotomal weakness; dermatomal dysesthesias; severe pain refractory to rest, activity modification, narcotic analgesics.  Free fragment noted, extruded cephalad on her MRI, compressing L4 root, indicated for decompression.  Risks and benefits discussed including bleeding, infection, damage to neurovascular structures, no change in symptoms, worsening symptoms, need for repeat debridement, DVT, PE, anesthetic complications, etc.  TECHNIQUE:  With the patient in supine position, after induction of adequate anesthesia, 2 g Kefzol, placed prone on the Mackay frame.  All bony prominences were well padded.  Lumbar region was prepped and draped in the usual sterile fashion.  Two 18 gauge spinal needles were utilized to localize L4-5 interspace, confirmed with an x-ray.  An incision was made from pedicle L4 to below the spinous process of L5.  Subcutaneous tissue was dissected.  Electrocautery was utilized to achieve hemostasis.  Dorsolumbar fascia identified, divided in line with skin incision.  Paraspinous muscle elevated from lamina of L4 and L5. McCullough retractors placed.  Operating microscope  draped and brought into the surgical field.  Confirmatory radiograph obtained. Hemilaminotomy at caudad edge of L4 was performed.  There was hypertrophic facet.  I removed the portion of the inferior process and then superior process utilizing microcurette.  Very small and narrow interlaminar window to reach the extruded fragment, therefore, had to remove a portion of the inferior process of L4.  Foraminotomy of L4-5 was performed.  L5 root was mobilized medially, after identification, removed ligamentum flavum from the interspace.  Hypertrophic venous plexus was noted and bipolar electrocautery was utilized to achieve hemostasis.  I examined the disk space.  There was no disk herniation there.  I inspected cephalad with Neuro Patties protecting the neural elements at all times.  We proceeded cephalad.  We saw an enlarged free fragment compressing the L4 root in its axilla as well as in the shoulder and compressing up against the L4 pedicle.  This was coaxed out with a micro nerve hook and micro pituitary and removed in 3 small fragments, and sent to Pathology.  Following this, the L4 root was decompressed, erythematous and edematous but intact.  Hockey-stick probe passed freely up the foramen of L4 and L5, following that and there was a 1 cm excursion of the L5 root medial to the pedicle without difficulty.  The [facility] retractor was probing cephalad to above the pedicle of L4 and below the pedicle of  L5 without difficulty.  Bipolar electrocautery was utilized to achieve hemostasis.  Inspection revealed no CSF leakage or active bleeding.  Thrombin-soaked Gelfoam, after copious irrigation, was placed in the laminotomy defect.  McCullough retractor was removed.  Paraspinous muscle was inspected, no evidence of active bleeding, copiously irrigated.  Dorsolumbar fascia reapproximated with 1 Vicryl in a figure-of-8 sutures, subcu with 2-0 Vicryl simple sutures.  Skin was reapproximated with  4-0 subcuticular Prolene.  Wound reinforced with Steri-Strips.  Sterile dressing applied.  He was placed supine on the hospital bed, extubated without difficulty, and transported to the recovery room in satisfactory condition.  The patient tolerated the procedure well with no complications.  Minimal blood loss.     Jene Every, M.D.     [name]  D:  01/10/2011  T:  01/11/2011  Job:  [id number]

## 2011-01-11 NOTE — Progress Notes (Signed)
D: 40yo A[employer] pt Dr. Shelle Iron now POD#1 s/p L4-L5 laminectomy and decompression microdisectomy due to herniated disc. Pt also with h/o anxiety, depression, left hip arthritis, and chronic pain. Pt having difficulties controlling pain with current prescribed meds and also requesting meds from home med rec that were not reordered post-op. A: reviewed meds with pt. Paged on cal MD Dr. Victorino Dike. Discussed pt's history and condition. Requested that pt's home med Klonipin 0.5mg  po qhs prn be ordered and requested that order for oxycodone 15mg  po q4hrs prn be modified to q3hrs prn to help provide improved pain management. Received orders for previously mentioned meds with instructions to give both now. R: Explained to patient changes in medications. Discussed nonpharmacological techniques to control pain.  At 0434 pt given dose of Klonipin 0.5mg  po and oxycodone 15mg  po per new orders.  Pt expressed her appreciation. Will cont to closely monitor pt. CKMerritt, RN

## 2011-01-11 NOTE — Progress Notes (Signed)
The patient is receiving Protonix by the intravenous route.  Based on criteria approved by the Pharmacy and Therapeutics Committee and the Medical Executive Committee, the medication is being converted to the equivalent oral dose form.  These criteria include: -No Active GI bleeding -Able to tolerate diet of full liquids (or better) or tube feeding OR able to tolerate other medications by the oral or enteral route  If you have any questions about this conversion, please contact the Pharmacy Department (ext [phone]).  Thank you.   Hessie Knows, PharmD, BCPS 01/11/2011 8:51 AM [phone]

## 2011-01-11 NOTE — Plan of Care (Signed)
Problem: Diagnosis - Type of Surgery Goal: General Surgical Patient Education (See Patient Education module for education specifics) Outcome: Progressing lumbar laminectomy

## 2011-01-11 NOTE — Progress Notes (Signed)
01/11/2011 Judith Hardy BSN CCM [phone] CM note: Patient is post op lumbar decompression l4-5, foraminotomies L4-5, lysis of adhesions. Patient plans to go home upon discharge where her Mother will be caregiver. Pt will need RW and 3N1. Oders for HHPT safety eval.[facility] will provide [facility] services and arrange for DME.

## 2011-01-12 MED FILL — Sodium Chloride Irrigation Soln 0.9%: Qty: 500 | Status: AC

## 2011-01-12 NOTE — Progress Notes (Signed)
Physical Therapy Treatment Patient Details Name: Judith Hardy MRN: [id number] DOB: 1970/03/01 Today's Date: 01/12/2011 [id number] 2gt PT Assessment/Plan  PT - Assessment/Plan Comments on Treatment Session: pt doing better, feels ready to go home, pain slowly improving PT Plan: Discharge plan remains appropriate Follow Up Recommendations: Home health PT Equipment Recommended: Rolling walker with 5" wheels PT Goals  Acute Rehab PT Goals PT Goal: Rolling Supine to Right Side - Progress: Met Pt will go Supine/Side to Sit:  (met) PT Goal: Sit to Stand - Progress: Partly met PT Goal: Stand to Sit - Progress: Partly met PT Goal: Ambulate - Progress: Met PT Goal: Up/Down Stairs - Progress: Partly met  PT Treatment Precautions/Restrictions  Precautions Precautions: Back Precaution Booklet Issued: Yes (comment) Required Braces or Orthoses: No Restrictions Weight Bearing Restrictions: No Mobility (including Balance) Bed Mobility Bed Mobility: Yes Rolling Right: 5: Supervision Rolling Right Details (indicate cue type and reason): cues knee flexion Right Sidelying to Sit: 5: Supervision Right Sidelying to Sit Details (indicate cue type and reason): increase time Transfers Sit to Stand: 5: Supervision Sit to Stand Details (indicate cue type and reason): cues posture Stand to Sit: To bed;5: Supervision Stand to Sit Details: cues for hands Ambulation/Gait Ambulation/Gait Assistance: 6: Modified independent (Device/Increase time);5: Supervision Ambulation/Gait Assistance Details (indicate cue type and reason): cues for no twisting during turns Ambulation Distance (Feet): 140 Feet Assistive device: Rolling walker Gait Pattern: Step-through pattern Stairs: Yes Stairs Assistance: 4: Min assist (min/guard) Stairs Assistance Details (indicate cue type and reason): cues sequence Stair Management Technique: One rail Right;With cane Number of Stairs: 3     Exercise    End of Session     [facility] 01/12/2011, 10:19 AM

## 2011-01-12 NOTE — Progress Notes (Signed)
Subjective: 2 Days Post-Op Procedure(s) (LRB): LUMBAR LAMINECTOMY/DECOMPRESSION MICRODISCECTOMY (N/A) Patient reports pain as 5 on 0-10 scale.   Denies CP or SOB.  Voiding without difficulty. Positive flatus.  Patient feeling much better today, feels ready for discharge Objective: Vital signs in last 24 hours: Temp:  [97.7 F (36.5 C)-99.7 F (37.6 C)] 97.7 F (36.5 C) (01/04 0556) Pulse Rate:  [77-99] 84  (01/04 0556) Resp:  [14-18] 16  (01/04 0556) BP: (106-124)/(65-78) 113/72 mmHg (01/04 0556) SpO2:  [97 %-100 %] 97 % (01/04 0556)  Intake/Output from previous day: 01/03 0701 - 01/04 0700 In: 2735 [P.O.:960; I.V.:1775] Out: -  Intake/Output this shift:    No results found for this basename: HGB:5 in the last 72 hours No results found for this basename: WBC:2,RBC:2,HCT:2,PLT:2 in the last 72 hours No results found for this basename: NA:2,K:2,CL:2,CO2:2,BUN:2,CREATININE:2,GLUCOSE:2,CALCIUM:2 in the last 72 hours No results found for this basename: LABPT:2,INR:2 in the last 72 hours  Neurologically intact ABD soft Neurovascular intact Sensation intact distally Dorsiflexion/Plantar flexion intact Incision: dressing C/D/I Compartment soft  Assessment/Plan: 2 Days Post-Op Procedure(s) (LRB): LUMBAR LAMINECTOMY/DECOMPRESSION MICRODISCECTOMY (N/A) Discharge home  Avaeh Ewer R. 01/12/2011, 8:04 AM

## 2011-01-12 NOTE — Progress Notes (Signed)
01/12/2011 Raynelle Bring BSn CCM [phone] DMe has been delivered  to pt's rpom. Genevieve Norlander will start services tomorrow. Placed in tlc

## 2011-06-13 ENCOUNTER — Encounter (HOSPITAL_COMMUNITY): Payer: Self-pay | Admitting: *Deleted

## 2011-06-13 ENCOUNTER — Emergency Department (HOSPITAL_COMMUNITY)
Admission: EM | Admit: 2011-06-13 | Discharge: 2011-06-13 | Disposition: A | Discharge status: Home Health | Payer: PRIVATE HEALTH INSURANCE | Attending: Emergency Medicine | Admitting: Emergency Medicine

## 2011-06-13 ENCOUNTER — Emergency Department (HOSPITAL_COMMUNITY): Payer: PRIVATE HEALTH INSURANCE

## 2011-06-13 DIAGNOSIS — M1990 Unspecified osteoarthritis, unspecified site: Secondary | ICD-10-CM | POA: Insufficient documentation

## 2011-06-13 DIAGNOSIS — M545 Low back pain, unspecified: Secondary | ICD-10-CM | POA: Insufficient documentation

## 2011-06-13 DIAGNOSIS — F3289 Other specified depressive episodes: Secondary | ICD-10-CM | POA: Insufficient documentation

## 2011-06-13 DIAGNOSIS — F411 Generalized anxiety disorder: Secondary | ICD-10-CM | POA: Insufficient documentation

## 2011-06-13 DIAGNOSIS — F329 Major depressive disorder, single episode, unspecified: Secondary | ICD-10-CM | POA: Insufficient documentation

## 2011-06-13 DIAGNOSIS — M5430 Sciatica, unspecified side: Secondary | ICD-10-CM

## 2011-06-13 HISTORY — DX: Unspecified osteoarthritis, unspecified site: M19.90

## 2011-06-13 MED ORDER — CYCLOBENZAPRINE HCL 10 MG PO TABS: 10.0000 mg | ORAL_TABLET | Freq: Two times a day (BID) | ORAL | Status: AC | PRN

## 2011-06-13 MED ORDER — DEXAMETHASONE SODIUM PHOSPHATE 10 MG/ML IJ SOLN
10.0000 mg | Freq: Once | INTRAMUSCULAR | Status: AC
  Administered 2011-06-13: 10 mg via INTRAMUSCULAR
  Filled 2011-06-13: qty 1

## 2011-06-13 MED ORDER — MORPHINE SULFATE 4 MG/ML IJ SOLN
4.0000 mg | Freq: Once | INTRAMUSCULAR | Status: AC
  Administered 2011-06-13: 4 mg via INTRAVENOUS
  Filled 2011-06-13: qty 1

## 2011-06-13 MED ORDER — HYDROMORPHONE HCL PF 1 MG/ML IJ SOLN
1.0000 mg | Freq: Once | INTRAMUSCULAR | Status: AC
  Administered 2011-06-13: 1 mg via INTRAVENOUS
  Filled 2011-06-13: qty 1

## 2011-06-13 MED ORDER — DIAZEPAM 5 MG PO TABS
5.0000 mg | ORAL_TABLET | Freq: Once | ORAL | Status: AC
  Administered 2011-06-13: 5 mg via ORAL
  Filled 2011-06-13: qty 1

## 2011-06-13 MED ORDER — DICLOFENAC SODIUM 75 MG PO TBEC: 75.0000 mg | DELAYED_RELEASE_TABLET | Freq: Two times a day (BID) | ORAL | Status: DC

## 2011-06-13 MED ORDER — PREDNISONE (PAK) 10 MG PO TABS: ORAL_TABLET | ORAL | Status: AC

## 2011-06-13 MED ORDER — KETOROLAC TROMETHAMINE 60 MG/2ML IM SOLN
60.0000 mg | Freq: Once | INTRAMUSCULAR | Status: AC
  Administered 2011-06-13: 60 mg via INTRAMUSCULAR
  Filled 2011-06-13: qty 2

## 2011-06-13 NOTE — ED Provider Notes (Signed)
History     CSN: [id number]  Arrival date & time 06/13/11  0729   First MD Initiated Contact with Patient 06/13/11 0757      8:37 AM HPI Patient reports 3 days ago was bending over to pick something up when she had a sudden pain in her left lower back. Reports since then has had severe left lumbar back pain with radiation down left posterior buttock and left lateral thigh. Denies numbness, tingling, weakness, incontinence, saddle anesthesias, perineal numbness, abdominal pain, urinary symptoms, vaginal discharge, nausea, vomiting. Reports she is on chronic pain medications due to pain and has taken 40 mg of oxymorphone today without relief. States she tried to call her back surgeon, Dr. Jillyn Hidden yesterday but he has not returned her call.Patient is a 41 y.o. female presenting with back pain. The history is provided by the patient.  Back Pain  This is a new problem. The current episode started more than 2 days ago. The problem occurs constantly. The problem has been gradually worsening. Associated with: bending over. The pain is present in the lumbar spine. The quality of the pain is described as shooting and stabbing. The pain radiates to the left thigh. The pain is severe. The symptoms are aggravated by bending, twisting and certain positions. Pertinent negatives include no chest pain, no fever, no numbness, no headaches, no abdominal pain, no bowel incontinence, no perianal numbness, no bladder incontinence, no dysuria, no pelvic pain, no leg pain, no paresthesias, no paresis, no tingling and no weakness. She has tried analgesics for the symptoms. The treatment provided no relief.    Past Medical History  Diagnosis Date  . Thyroid enlarged     PT STATES TOLD HER THYROID ENLARGED--BUT NO TX.  PT TAKES OTC THYROID MEDICATION  . Anxiety   . Depression   . Arthritis   . Osteoarthritis     Past Surgical History  Procedure Date  . Appendectomy 2011  . Lumbar laminectomy/decompression  microdiscectomy 01/10/2011    Procedure: LUMBAR LAMINECTOMY/DECOMPRESSION MICRODISCECTOMY;  Surgeon: Javier Docker;  Location: [facility];  Service: Orthopedics;  Laterality: N/A;  Decompression L4 - L5  (X-Ray)    No family history on file.  History  Substance Use Topics  . Smoking status: Never Smoker   . Smokeless tobacco: Never Used  . Alcohol Use: No    OB History    Grav Para Term Preterm Abortions TAB SAB Ect Mult Living                  Review of Systems  Constitutional: Negative for fever and chills.  HENT: Negative for neck pain and neck stiffness.   Cardiovascular: Negative for chest pain.  Gastrointestinal: Negative for abdominal pain and bowel incontinence.  Genitourinary: Negative for bladder incontinence, dysuria, urgency, frequency, hematuria, flank pain and pelvic pain.  Musculoskeletal: Positive for back pain.       Denies saddle anesthesias, or perineal numbness, urinary or bowel incontinence  Neurological: Negative for tingling, weakness, numbness, headaches and paresthesias.  All other systems reviewed and are negative.    Allergies  Review of patient's allergies indicates no known allergies.  Home Medications   Current Outpatient Rx  Name Route Sig Dispense Refill  . CALCIUM + D PO Oral Take 2 tablets by mouth at bedtime.     Marland Kitchen CITALOPRAM HYDROBROMIDE 20 MG PO TABS Oral Take 20 mg by mouth daily before breakfast.     . CLONAZEPAM 0.5 MG PO TABS Oral Take 1 mg  by mouth at bedtime as needed.     . CYCLOBENZAPRINE HCL 10 MG PO TABS Oral Take 10 mg by mouth 3 (three) times daily as needed. Muscle spasm      . GABAPENTIN 300 MG PO CAPS Oral Take 300 mg by mouth 4 (four) times daily as needed. Pain     . GREEN TEA PO Oral Take 1 tablet by mouth 2 (two) times daily.     Marland Kitchen HYDROCODONE-IBUPROFEN 10-200 MG PO TABS Oral Take 1 tablet by mouth every 6 (six) hours as needed. Pain      . IBUPROFEN 200 MG PO TABS Oral Take 600 mg by mouth every 6 (six) hours as  needed. Pain     . L-LYSINE PO Oral Take 3 tablets by mouth daily.     Marland Kitchen MAGNESIUM OXIDE 400 MG PO TABS Oral Take 800 mg by mouth at bedtime.     . ADULT MULTIVITAMIN W/MINERALS CH Oral Take 1 tablet by mouth 3 (three) times daily.     Marland Kitchen FISH OIL 1000 MG PO CAPS Oral Take 1 capsule by mouth 3 (three) times daily.     Marland Kitchen OVER THE COUNTER MEDICATION Oral Take 1 tablet by mouth 2 (two) times daily. Thyroid complex     . OVER THE COUNTER MEDICATION  SUPER CALCIUM 1200 MG WITH 800 IU VITAMIN D   TAKES 2 AT BEDTIME     . OVER THE COUNTER MEDICATION  MAGNESIUM CAPS  400 MG  -PATIENT TAKES 2 AT BEDTIME     . VITAMIN C 500 MG PO TABS Oral Take 500 mg by mouth 2 (two) times daily.       BP 119/74  Pulse 99  Temp 98.4 F (36.9 C)  Resp 16  Ht 5\' 7"  (1.702 m)  Wt 189 lb (85.73 kg)  BMI 29.60 kg/m2  SpO2 100%  Physical Exam  Vitals reviewed. Constitutional: She is oriented to person, place, and time. Vital signs are normal. She appears well-developed and well-nourished. No distress.  HENT:  Head: Normocephalic and atraumatic.  Eyes: Pupils are equal, round, and reactive to light.  Neck: Neck supple.  Pulmonary/Chest: Effort normal.  Abdominal: She exhibits no distension. There is no tenderness. There is no rebound and no guarding.  Musculoskeletal:       Lumbar back: She exhibits decreased range of motion, tenderness, pain and spasm. She exhibits no swelling and normal pulse.       Back:  Neurological: She is alert and oriented to person, place, and time.  Skin: Skin is warm and dry. No rash noted. No erythema. No pallor.  Psychiatric: She has a normal mood and affect. Her behavior is normal.    ED Course  Procedures   Dg Lumbar Spine Complete  06/13/2011  *RADIOLOGY REPORT*  Clinical Data: Back pain extending down left leg.  Bent over to pick up something.  LUMBAR SPINE - COMPLETE 4+ VIEW  Comparison: Intraoperative lateral view of the lumbar spine 01/10/2011.  Findings: Normal  alignment without significant disc space narrowing.  No pars defect detected.  IUD is in place.  IMPRESSION: Normal alignment without disc space narrowing.  Original Report Authenticated By: Fuller Canada, M.D.     MDM   8:07 AM I have been in the room twice the patient is not in room  8:37 AM Nurse tech informs me that patient was placed in the wrong room on epic. I have been in to evaluate the patient appears to have  sciatica on the left side. Patient requests having an MRI. Explained patient shows no signs of cord compression. Likely has neuropathy potentially from a new herniated disc or a muscle strain. Advised patient we will treat pain in the emergency department in order to  lumbar spine x-ray. Advised patient usually conservative therapy is recommended. Patient voices understanding. Prescribed Toradol IM, Decadron IM and Valium.   1:41 PM Patient reports pain has decreased to a 8/10. Requests that I speak with Dr. Shelle Iron. I'm finally able to get in touch with Dr Ermelinda Das PA. Discussed patient's case and states that MRI would not be suitable. States patient is essential a pain management patient. States [facility] will call Mrs. James to schedule an appt in 1-2 days. Discussed this with patient and states that she has already received a call from [facility] and has a f/u scheduled in 2 days.      Thomasene Lot, PA-C 06/14/11 [phone]

## 2011-06-13 NOTE — Discharge Instructions (Signed)
Back Exercises Back exercises help treat and prevent back injuries. The goal is to increase your strength in your belly (abdominal) and back muscles. These exercises can also help with flexibility. Start these exercises when told by your doctor. HOME CARE Back exercises include: Pelvic Tilt.  Lie on your back with your knees bent. Tilt your pelvis until the lower part of your back is against the floor. Hold this position 5 to 10 sec. Repeat this exercise 5 to 10 times.  Knee to Chest.  Pull 1 knee up against your chest and hold for 20 to 30 seconds. Repeat this with the other knee. This may be done with the other leg straight or bent, whichever feels better. Then, pull both knees up against your chest.  Sit-Ups or Curl-Ups.  Bend your knees 90 degrees. Start with tilting your pelvis, and do a partial, slow sit-up. Only lift your upper half 30 to 45 degrees off the floor. Take at least 2 to 3 seonds for each sit-up. Do not do sit-ups with your knees out straight. If partial sit-ups are difficult, simply do the above but with only tightening your belly (abdominal) muscles and holding it as told.  Hip-Lift.  Lie on your back with your knees flexed 90 degrees. Push down with your feet and shoulders as you raise your hips 2 inches off the floor. Hold for 10 seconds, repeat 5 to 10 times.  Back Arches.  Lie on your stomach. Prop yourself up on bent elbows. Slowly press on your hands, causing an arch in your low back. Repeat 3 to 5 times.  Shoulder-Lifts.  Lie face down with arms beside your body. Keep hips and belly pressed to floor as you slowly lift your head and shoulders off the floor.  Do not overdo your exercises. Be careful in the beginning. Exercises may cause you some mild back discomfort. If the pain lasts for more than 15 minutes, stop the exercises until you see your doctor. Improvement with exercise for back problems is slow.  Document Released: 01/27/2010 Document Revised: 12/14/2010  Document Reviewed: 01/27/2010 [facility] Patient Information 2012 [place], [place].Chronic Back Pain When back pain lasts longer than 3 months, it is called chronic back pain.This pain can be frustrating, but the cause of the pain is rarely dangerous.People with chronic back pain often go through certain periods that are more intense (flare-ups). CAUSES Chronic back pain can be caused by wear and tear (degeneration) on different structures in your back. These structures may include bones, ligaments, or discs. This degeneration may result in more pressure being placed on the nerves that travel to your legs and feet. This can lead to pain traveling from the low back down the back of the legs. When pain lasts longer than 3 months, it is not unusual for people to experience anxiety or depression. Anxiety and depression can also contribute to low back pain. TREATMENT  Establish a regular exercise plan. This is critical to improving your functional level.   Have a self-management plan for when you flare-up. Flare-ups rarely require a medical visit. Regular exercise will help reduce the intensity and frequency of your flare-ups.   Manage how you feel about your back pain and the rest of your life. Anxiety, depression, and feeling that you cannot alter your back pain have been shown to make back pain more intense and debilitating.   Medicines should never be your only treatment. They should be used along with other treatments to help you return to a  more active lifestyle.   Procedures such as injections or surgery may be helpful but are rarely necessary. You may be able to get the same results with physical therapy or chiropractic care.  HOME CARE INSTRUCTIONS  Avoid bending, heavy lifting, prolonged sitting, and activities which make the problem worse.   Continue normal activity as much as possible.   Take brief periods of rest throughout the day to reduce your pain during flare-ups.   Follow your  back exercise rehabilitation program. This can help reduce symptoms and prevent more pain.   Only take over-the-counter or prescription medicines as directed by your caregiver. Muscle relaxants are sometimes prescribed. Narcotic pain medicine is discouraged for long-term pain, since addiction is a possible outcome.   If you smoke, quit.   Eat healthy foods and maintain a recommended body weight.  SEEK IMMEDIATE MEDICAL CARE IF:   You have weakness or numbness in one of your legs or feet.   You have trouble controlling your bladder or bowels.   You develop nausea, vomiting, abdominal pain, shortness of breath, or fainting.  Document Released: 02/02/2004 Document Revised: 12/14/2010 Document Reviewed: 12/09/2010 [facility] Patient Information 2012 [place], [place].Sciatica Sciatica is a weakness and/or changes in sensation (tingling, jolts, hot and cold, numbness) along the path the sciatic nerve travels. Irritation or damage to lumbar nerve roots is often also referred to as lumbar radiculopathy.  Lumbar radiculopathy (Sciatica) is the most common form of this problem. Radiculopathy can occur in any of the nerves coming out of the spinal cord. The problems caused depend on which nerves are involved. The sciatic nerve is the large nerve supplying the branches of nerves going from the hip to the toes. It often causes a numbness or weakness in the skin and/or muscles that the sciatic nerve serves. It also may cause symptoms (problems) of pain, burning, tingling, or electric shock-like feelings in the path of this nerve. This usually comes from injury to the fibers that make up the sciatic nerve. Some of these symptoms are low back pain and/or unpleasant feelings in the following areas:  From the mid-buttock down the back of the leg to the back of the knee.   And/or the outside of the calf and top of the foot.   And/or behind the inner ankle to the sole of the foot.  CAUSES   Herniated or slipped  disc. Discs are the little cushions between the bones in the back.   Pressure by the piriformis muscle in the buttock on the sciatic nerve (Piriformis Syndrome).   Misalignment of the bones in the lower back and buttocks (Sacroiliac Joint Derangement).   Narrowing of the spinal canal that puts pressure on or pinches the fibers that make up the sciatic nerve.   A slipped vertebra that is out of line with those above or beneath it.   Abnormality of the nervous system itself so that nerve fibers do not transmit signals properly, especially to feet and calves (neuropathy).   Tumor (this is rare).  Your caregiver can usually determine the cause of your sciatica and begin the treatment most likely to help you. TREATMENT  Taking over-the-counter painkillers, physical therapy, rest, exercise, spinal manipulation, and injections of anesthetics and/or steroids may be used. Surgery, acupuncture, and Yoga can also be effective. Mind over matter techniques, mental imagery, and changing factors such as your bed, chair, desk height, posture, and activities are other treatments that may be helpful. You and your caregiver can help determine what is  best for you. With proper diagnosis, the cause of most sciatica can be identified and removed. Communication and cooperation between your caregiver and you is essential. If you are not successful immediately, do not be discouraged. With time, a proper treatment can be found that will make you comfortable. HOME CARE INSTRUCTIONS   If the pain is coming from a problem in the back, applying ice to that area for 15 to 20 minutes, 3 to 4 times per day while awake, may be helpful. Put the ice in a plastic bag. Place a towel between the bag of ice and your skin.   You may exercise or perform your usual activities if these do not aggravate your pain, or as suggested by your caregiver.   Only take over-the-counter or prescription medicines for pain, discomfort, or fever as  directed by your caregiver.   If your caregiver has given you a follow-up appointment, it is very important to keep that appointment. Not keeping the appointment could result in a chronic or permanent injury, pain, and disability. If there is any problem keeping the appointment, you must call back to this facility for assistance.  SEEK IMMEDIATE MEDICAL CARE IF:   You experience loss of control of bowel or bladder.   You have increasing weakness in the trunk, buttocks, or legs.   There is numbness in any areas from the hip down to the toes.   You have difficulty walking or keeping your balance.   You have any of the above, with fever or forceful vomiting.  Document Released: 12/19/2000 Document Revised: 12/14/2010 Document Reviewed: 08/08/2007 [facility] Patient Information 2012 [place], [place].

## 2011-06-13 NOTE — ED Notes (Signed)
Pt c/o back pain radiating down left leg since Monday. Pt states she had lumbar surgery in January with no complications until now. States she called Careers adviser. Pt took 40mg  oxymorphone today

## 2011-06-13 NOTE — ED Notes (Signed)
Patient transported to X-ray 

## 2011-06-18 NOTE — ED Provider Notes (Signed)
Medical screening examination/treatment/procedure(s) were performed by non-physician practitioner and as supervising physician I was immediately available for consultation/collaboration. Derreck Wiltsey, MD, FACEP   Khyler Urda L Shanica Castellanos, MD 06/18/11 1500 

## 2011-07-06 ENCOUNTER — Ambulatory Visit (HOSPITAL_COMMUNITY)
Admission: RE | Admit: 2011-07-06 | Discharge: 2011-07-06 | Disposition: A | Discharge status: Home / Self Care | Payer: PRIVATE HEALTH INSURANCE | Source: Ambulatory Visit | Attending: Physician Assistant | Admitting: Physician Assistant

## 2011-07-06 ENCOUNTER — Other Ambulatory Visit (HOSPITAL_COMMUNITY): Payer: Self-pay | Admitting: Physician Assistant

## 2011-07-06 DIAGNOSIS — R52 Pain, unspecified: Secondary | ICD-10-CM

## 2011-07-06 DIAGNOSIS — M549 Dorsalgia, unspecified: Secondary | ICD-10-CM | POA: Insufficient documentation

## 2011-07-14 ENCOUNTER — Encounter (HOSPITAL_COMMUNITY): Payer: Self-pay

## 2011-07-14 ENCOUNTER — Emergency Department (HOSPITAL_COMMUNITY)
Admission: EM | Admit: 2011-07-14 | Discharge: 2011-07-14 | Disposition: A | Discharge status: Home / Self Care | Payer: PRIVATE HEALTH INSURANCE | Attending: Emergency Medicine | Admitting: Emergency Medicine

## 2011-07-14 DIAGNOSIS — M545 Low back pain, unspecified: Secondary | ICD-10-CM | POA: Insufficient documentation

## 2011-07-14 DIAGNOSIS — F329 Major depressive disorder, single episode, unspecified: Secondary | ICD-10-CM | POA: Insufficient documentation

## 2011-07-14 DIAGNOSIS — M533 Sacrococcygeal disorders, not elsewhere classified: Secondary | ICD-10-CM | POA: Insufficient documentation

## 2011-07-14 DIAGNOSIS — Z79899 Other long term (current) drug therapy: Secondary | ICD-10-CM | POA: Insufficient documentation

## 2011-07-14 DIAGNOSIS — F411 Generalized anxiety disorder: Secondary | ICD-10-CM | POA: Insufficient documentation

## 2011-07-14 DIAGNOSIS — F3289 Other specified depressive episodes: Secondary | ICD-10-CM | POA: Insufficient documentation

## 2011-07-14 DIAGNOSIS — M1990 Unspecified osteoarthritis, unspecified site: Secondary | ICD-10-CM | POA: Insufficient documentation

## 2011-07-14 DIAGNOSIS — G8929 Other chronic pain: Secondary | ICD-10-CM | POA: Insufficient documentation

## 2011-07-14 MED ORDER — DIAZEPAM 5 MG PO TABS
10.0000 mg | ORAL_TABLET | Freq: Once | ORAL | Status: AC
  Administered 2011-07-14: 10 mg via ORAL
  Filled 2011-07-14: qty 2

## 2011-07-14 MED ORDER — HYDROMORPHONE HCL PF 1 MG/ML IJ SOLN
2.0000 mg | Freq: Once | INTRAMUSCULAR | Status: AC
  Administered 2011-07-14: 2 mg via INTRAMUSCULAR
  Filled 2011-07-14 (×2): qty 1

## 2011-07-14 MED ORDER — HYDROMORPHONE HCL PF 1 MG/ML IJ SOLN
1.0000 mg | Freq: Once | INTRAMUSCULAR | Status: AC
  Administered 2011-07-14: 1 mg via INTRAMUSCULAR
  Filled 2011-07-14: qty 1

## 2011-07-14 MED ORDER — PREDNISONE 10 MG PO TABS: 20.0000 mg | ORAL_TABLET | Freq: Every day | ORAL | Status: DC

## 2011-07-14 MED ORDER — PREDNISONE 20 MG PO TABS
60.0000 mg | ORAL_TABLET | Freq: Once | ORAL | Status: AC
  Administered 2011-07-14: 60 mg via ORAL
  Filled 2011-07-14: qty 3

## 2011-07-14 NOTE — ED Notes (Signed)
Pt in from home with lower back pain states onset several weeks ago hx of lumbar surg. States pain radiates to both legs,denies difficulty urinating states MRI 1 week ago states was told she re-injured disc, no relief with pain meds from pain clinic, pt of Dr. Shelle Iron states possible surg.

## 2011-07-14 NOTE — ED Notes (Signed)
Pt is tearful in room, wheel her to restroom voided and returned, repositoned her with 5 pillows, gave pt 2 cups of ice water and 2 gingerales, tissue box, and medicated pt.

## 2011-07-14 NOTE — ED Notes (Signed)
Pt called me in insisted that the gingerale we gave her was a diet gingerale, when informed that we did gave her regular gingergale, pt refused to believe it and continue to tell me and she is currently drinking diet gingerale. Sprite given to pt in subsititute

## 2011-07-14 NOTE — ED Notes (Signed)
Hx of sciatic, c/o pain at mid lower back, shooting, stabbing, radiates down to bilateral legs worst on left side. Tender to palpate. Lumbar injury in past, recent MRI (1 week ago) at [facility] shown a re-rupture of the lumbar disc per the patient. Pt tearful in room. Current home pain medicine is not relieving her pain.

## 2011-07-14 NOTE — ED Provider Notes (Addendum)
History     CSN: [id number]  Arrival date & time 07/14/11  4098   First MD Initiated Contact with Patient 07/14/11 1013      Chief Complaint  Patient presents with  . Back Pain    (Consider location/radiation/quality/duration/timing/severity/associated sxs/prior treatment) Patient is a 41 y.o. female presenting with back pain. The history is provided by the patient.  Back Pain    patient here with worsening chronic back pain. Seen by her orthopedist this week and diagnosed with reinjuring her ruptured disc by MRI that was done this week. She denies any urinary retention or bowel incontinence. Denies any perineal numbness. Does use a cane to walk but denies any foot drop or new trauma. She does go to the pain clinic and takes opana 30mg  bid. He has this severe and worse with movement. No urinary symptoms  Past Medical History  Diagnosis Date  . Thyroid enlarged     PT STATES TOLD HER THYROID ENLARGED--BUT NO TX.  PT TAKES OTC THYROID MEDICATION  . Anxiety   . Depression   . Arthritis   . Osteoarthritis     Past Surgical History  Procedure Date  . Appendectomy 2011  . Lumbar laminectomy/decompression microdiscectomy 01/10/2011    Procedure: LUMBAR LAMINECTOMY/DECOMPRESSION MICRODISCECTOMY;  Surgeon: Javier Docker;  Location: [facility];  Service: Orthopedics;  Laterality: N/A;  Decompression L4 - L5  (X-Ray)    No family history on file.  History  Substance Use Topics  . Smoking status: Never Smoker   . Smokeless tobacco: Never Used  . Alcohol Use: No    OB History    Grav Para Term Preterm Abortions TAB SAB Ect Mult Living                  Review of Systems  Musculoskeletal: Positive for back pain.  All other systems reviewed and are negative.    Allergies  Review of patient's allergies indicates no known allergies.  Home Medications   Current Outpatient Rx  Name Route Sig Dispense Refill  . CITALOPRAM HYDROBROMIDE 20 MG PO TABS Oral Take 20 mg by mouth  daily before breakfast.     . CLONAZEPAM 0.5 MG PO TABS Oral Take 1 mg by mouth at bedtime as needed. For sleep    . CYCLOBENZAPRINE HCL 10 MG PO TABS Oral Take 10 mg by mouth 3 (three) times daily as needed. Muscle spasm      . DICLOFENAC SODIUM 75 MG PO TBEC Oral Take 1 tablet (75 mg total) by mouth 2 (two) times daily. 30 tablet 0  . GABAPENTIN (PHN) 600 MG PO TABS Oral Take 1,800 mg by mouth 3 (three) times daily.    Marland Kitchen GREEN TEA PO Oral Take 1 tablet by mouth 2 (two) times daily.     Marland Kitchen L-LYSINE PO Oral Take 3 tablets by mouth daily.     . ADULT MULTIVITAMIN W/MINERALS CH Oral Take 1 tablet by mouth 2 (two) times daily.     Marland Kitchen FISH OIL 1000 MG PO CAPS Oral Take 1 capsule by mouth 2 (two) times daily.     Marland Kitchen OVER THE COUNTER MEDICATION Oral Take 1 tablet by mouth 2 (two) times daily. Thyroid complex     . OVER THE COUNTER MEDICATION  SUPER CALCIUM 1200 MG WITH 800 IU VITAMIN D   TAKES 2 AT BEDTIME     . OVER THE COUNTER MEDICATION  MAGNESIUM CAPS  400 MG  -PATIENT TAKES 2 AT BEDTIME     .  VITAMIN C 500 MG PO TABS Oral Take 1,000 mg by mouth daily.       BP 129/95  Pulse 83  Temp 98.1 F (36.7 C) (Oral)  Resp 16  SpO2 100%  Physical Exam  Nursing note and vitals reviewed. Constitutional: She is oriented to person, place, and time. She appears well-developed and well-nourished.  Non-toxic appearance. No distress.  HENT:  Head: Normocephalic and atraumatic.  Eyes: Conjunctivae, EOM and lids are normal. Pupils are equal, round, and reactive to light.  Neck: Normal range of motion. Neck supple. No tracheal deviation present. No mass present.  Cardiovascular: Normal rate, regular rhythm and normal heart sounds.  Exam reveals no gallop.   No murmur heard. Pulmonary/Chest: Effort normal and breath sounds normal. No stridor. No respiratory distress. She has no decreased breath sounds. She has no wheezes. She has no rhonchi. She has no rales.  Abdominal: Soft. Normal appearance and bowel  sounds are normal. She exhibits no distension. There is no tenderness. There is no rebound and no CVA tenderness.  Musculoskeletal: Normal range of motion. She exhibits no edema and no tenderness.       Paraspinal tenderness at the lumbar sacral area.  Neurological: She is alert and oriented to person, place, and time. She has normal strength. She displays no atrophy. No cranial nerve deficit or sensory deficit. She exhibits abnormal muscle tone. GCS eye subscore is 4. GCS verbal subscore is 5. GCS motor subscore is 6.  Reflex Scores:      Patellar reflexes are 1+ on the right side and 1+ on the left side. Skin: Skin is warm and dry. No abrasion and no rash noted.  Psychiatric: Her speech is normal and behavior is normal. Her mood appears anxious.    ED Course  Procedures (including critical care time)  Labs Reviewed - No data to display No results found.   No diagnosis found.    MDM  Patient given pain medication does flow better now. She will followup with her pain management specialist and will place her on prednisone here  1:19 PM Patient requested that I speak with her pain management specialist. I called the office never close, I then paged the on-call person and he did not return my call. Patient is requesting additional pain medication and a prescription for opiates. I informed her that she is under a pain contract and that I am unable to prescribe her additional opiates. She is to followup with her pain management specialist      Toy Baker, MD 07/14/11 1205  Toy Baker, MD 07/14/11 1321

## 2011-07-23 ENCOUNTER — Encounter (HOSPITAL_COMMUNITY): Payer: Self-pay | Admitting: Pharmacy Technician

## 2011-07-24 ENCOUNTER — Other Ambulatory Visit: Payer: Self-pay | Admitting: Specialist

## 2011-07-24 NOTE — Pre-Procedure Instructions (Signed)
Paged dr beane x 20 min, no response, ? Need back x ray with pst visit.

## 2011-07-25 ENCOUNTER — Encounter (HOSPITAL_COMMUNITY): Payer: Self-pay

## 2011-07-25 ENCOUNTER — Encounter (HOSPITAL_COMMUNITY)
Admission: RE | Admit: 2011-07-25 | Discharge: 2011-07-25 | Disposition: A | Discharge status: Home / Self Care | Payer: PRIVATE HEALTH INSURANCE | Source: Ambulatory Visit | Attending: Specialist | Admitting: Specialist

## 2011-07-25 LAB — CBC
HCT: 40.5 % (ref 36.0–46.0)
Hemoglobin: 13.6 g/dL (ref 12.0–15.0)
WBC: 13 10*3/uL — ABNORMAL HIGH (ref 4.0–10.5)

## 2011-07-25 LAB — BASIC METABOLIC PANEL
BUN: 9 mg/dL (ref 6–23)
CO2: 28 mEq/L (ref 19–32)
Chloride: 96 mEq/L (ref 96–112)
GFR calc Af Amer: >90 mL/min (ref >90)
Potassium: 4.6 mEq/L (ref 3.5–5.1)

## 2011-07-25 LAB — URINALYSIS, ROUTINE W REFLEX MICROSCOPIC
Leukocytes, UA: NEGATIVE
Nitrite: NEGATIVE
Specific Gravity, Urine: 1.015 (ref 1.005–1.030)
pH: 6 (ref 5.0–8.0)

## 2011-07-25 LAB — PROTIME-INR: INR: 0.93 (ref 0.00–1.49)

## 2011-07-25 LAB — SURGICAL PCR SCREEN
MRSA, PCR: NEGATIVE
Staphylococcus aureus: NEGATIVE

## 2011-07-25 LAB — HCG, SERUM, QUALITATIVE: Preg, Serum: NEGATIVE

## 2011-07-25 NOTE — Patient Instructions (Signed)
[address] Donell Fayrene Fearing  07/25/2011   Your procedure is scheduled on:  08/02/11 300pm-503pm  Report to [facility] at 1230pm.  Call this number if you have problems the morning of surgery: [phone]   Remember:   Do not eat food:After Midnight.  May have clear liquids:until 0830am then npo .  Clear liquids include soda, tea, black coffee, apple or grape juice, broth.  Take these medicines the morning of surgery with A SIP OF WATER:    Do not wear jewelry, make-up or nail polish.  Do not wear lotions, powders, or perfumes.  Do not shave 48 hours prior to surgery.   Do not bring valuables to the hospital.  Contacts, dentures or bridgework may not be worn into surgery.  Leave suitcase in the car. After surgery it may be brought to your room.  For patients admitted to the hospital, checkout time is 11:00 AM the day of discharge.      Special Instructions: CHG Shower Use Special Wash: 1/2 bottle night before surgery and 1/2 bottle morning of surgery. shower chin to toes with CHG.  Wash face and private parts with regular soap.   Please read over the following fact sheets that you were given: MRSA Information, coughing and deep breathing exercises, leg exercises

## 2011-08-02 ENCOUNTER — Inpatient Hospital Stay
Admission: RE | Admit: 2011-08-02 | Discharge: 2011-08-02 | Disposition: A | Discharge status: Home / Self Care | Payer: Self-pay | Source: Ambulatory Visit | Attending: Specialist | Admitting: Specialist

## 2011-08-02 ENCOUNTER — Other Ambulatory Visit (HOSPITAL_COMMUNITY): Payer: Self-pay | Admitting: Specialist

## 2011-08-02 ENCOUNTER — Ambulatory Visit (HOSPITAL_COMMUNITY): Payer: PRIVATE HEALTH INSURANCE

## 2011-08-02 ENCOUNTER — Inpatient Hospital Stay (HOSPITAL_COMMUNITY)
Admission: RE | Admit: 2011-08-02 | Discharge: 2011-08-06 | DRG: 490 | Disposition: A | Discharge status: Home / Self Care | Payer: PRIVATE HEALTH INSURANCE | Source: Ambulatory Visit | Attending: Specialist | Admitting: Specialist

## 2011-08-02 ENCOUNTER — Encounter (HOSPITAL_COMMUNITY): Payer: Self-pay | Admitting: *Deleted

## 2011-08-02 ENCOUNTER — Ambulatory Visit (HOSPITAL_COMMUNITY): Payer: PRIVATE HEALTH INSURANCE | Admitting: Anesthesiology

## 2011-08-02 ENCOUNTER — Encounter (HOSPITAL_COMMUNITY): Payer: Self-pay | Admitting: Anesthesiology

## 2011-08-02 ENCOUNTER — Encounter (HOSPITAL_COMMUNITY)
Admission: RE | Disposition: A | Discharge status: Home / Self Care | Payer: Self-pay | Source: Ambulatory Visit | Attending: Specialist

## 2011-08-02 DIAGNOSIS — M5126 Other intervertebral disc displacement, lumbar region: Secondary | ICD-10-CM | POA: Diagnosis present

## 2011-08-02 DIAGNOSIS — Z01812 Encounter for preprocedural laboratory examination: Secondary | ICD-10-CM

## 2011-08-02 DIAGNOSIS — M549 Dorsalgia, unspecified: Secondary | ICD-10-CM

## 2011-08-02 DIAGNOSIS — K560 Paralytic ileus: Secondary | ICD-10-CM | POA: Diagnosis not present

## 2011-08-02 DIAGNOSIS — Z9889 Other specified postprocedural states: Secondary | ICD-10-CM

## 2011-08-02 HISTORY — PX: LUMBAR LAMINECTOMY/DECOMPRESSION MICRODISCECTOMY: SHX5026

## 2011-08-02 LAB — ABO/RH: ABO/RH(D): A POS

## 2011-08-02 SURGERY — LUMBAR LAMINECTOMY/DECOMPRESSION MICRODISCECTOMY
Anesthesia: General | Site: Back | Wound class: Clean

## 2011-08-02 MED ORDER — CEFAZOLIN SODIUM-DEXTROSE 2-3 GM-% IV SOLR
2.0000 g | Freq: Three times a day (TID) | INTRAVENOUS | Status: AC
  Administered 2011-08-02 – 2011-08-03 (×2): 2 g via INTRAVENOUS
  Filled 2011-08-02 (×2): qty 50

## 2011-08-02 MED ORDER — THROMBIN 5000 UNITS EX SOLR
CUTANEOUS | Status: DC | PRN
  Administered 2011-08-02: 5000 [IU] via TOPICAL

## 2011-08-02 MED ORDER — PHENOL 1.4 % MT LIQD
1.0000 | OROMUCOSAL | Status: DC | PRN
  Filled 2011-08-02: qty 177

## 2011-08-02 MED ORDER — SODIUM CHLORIDE 0.9 % IJ SOLN
3.0000 mL | INTRAMUSCULAR | Status: DC | PRN
  Administered 2011-08-04: 3 mL via INTRAVENOUS

## 2011-08-02 MED ORDER — ACETAMINOPHEN 10 MG/ML IV SOLN
INTRAVENOUS | Status: DC | PRN
  Administered 2011-08-02: 1000 mg via INTRAVENOUS

## 2011-08-02 MED ORDER — OXYMETAZOLINE HCL 0.05 % NA SOLN
2.0000 | Freq: Two times a day (BID) | NASAL | Status: DC
  Administered 2011-08-03 – 2011-08-06 (×6): 2 via NASAL
  Filled 2011-08-02: qty 15

## 2011-08-02 MED ORDER — GLYCOPYRROLATE 0.2 MG/ML IJ SOLN
INTRAMUSCULAR | Status: DC | PRN
Start: 2011-08-02 — End: 2011-08-02
  Administered 2011-08-02: 0.4 mg via INTRAVENOUS

## 2011-08-02 MED ORDER — HYDROMORPHONE HCL PF 1 MG/ML IJ SOLN
INTRAMUSCULAR | Status: AC
  Filled 2011-08-02: qty 1

## 2011-08-02 MED ORDER — ROCURONIUM BROMIDE 100 MG/10ML IV SOLN
INTRAVENOUS | Status: DC | PRN
  Administered 2011-08-02: 40 mg via INTRAVENOUS
  Administered 2011-08-02: 10 mg via INTRAVENOUS

## 2011-08-02 MED ORDER — ONDANSETRON HCL 4 MG/2ML IJ SOLN: 4.0000 mg | INTRAMUSCULAR | Status: DC | PRN

## 2011-08-02 MED ORDER — ACETAMINOPHEN 325 MG PO TABS
650.0000 mg | ORAL_TABLET | ORAL | Status: DC | PRN
  Administered 2011-08-06: 650 mg via ORAL
  Filled 2011-08-02: qty 5

## 2011-08-02 MED ORDER — LIDOCAINE HCL (CARDIAC) 20 MG/ML IV SOLN
INTRAVENOUS | Status: DC | PRN
  Administered 2011-08-02: 100 mg via INTRAVENOUS

## 2011-08-02 MED ORDER — 0.9 % SODIUM CHLORIDE (POUR BTL) OPTIME
TOPICAL | Status: DC | PRN
  Administered 2011-08-02: 1000 mL

## 2011-08-02 MED ORDER — THROMBIN 5000 UNITS EX SOLR
CUTANEOUS | Status: AC
  Filled 2011-08-02: qty 10000

## 2011-08-02 MED ORDER — CEFAZOLIN SODIUM-DEXTROSE 2-3 GM-% IV SOLR
INTRAVENOUS | Status: AC
  Filled 2011-08-02: qty 50

## 2011-08-02 MED ORDER — HYDROMORPHONE HCL 2 MG PO TABS
8.0000 mg | ORAL_TABLET | ORAL | Status: DC
  Administered 2011-08-02: 8 mg via ORAL
  Administered 2011-08-02: 4 mg via ORAL
  Administered 2011-08-03: 8 mg via ORAL
  Filled 2011-08-02: qty 2
  Filled 2011-08-02 (×2): qty 4

## 2011-08-02 MED ORDER — MORPHINE SULFATE ER 100 MG PO TBCR
100.0000 mg | EXTENDED_RELEASE_TABLET | Freq: Two times a day (BID) | ORAL | Status: DC
  Administered 2011-08-02 – 2011-08-06 (×8): 100 mg via ORAL
  Filled 2011-08-02 (×8): qty 1

## 2011-08-02 MED ORDER — CITALOPRAM HYDROBROMIDE 20 MG PO TABS
20.0000 mg | ORAL_TABLET | Freq: Every day | ORAL | Status: DC
  Administered 2011-08-03 – 2011-08-06 (×4): 20 mg via ORAL
  Filled 2011-08-02 (×5): qty 1

## 2011-08-02 MED ORDER — MENTHOL 3 MG MT LOZG
1.0000 | LOZENGE | OROMUCOSAL | Status: DC | PRN
  Filled 2011-08-02: qty 9

## 2011-08-02 MED ORDER — GABAPENTIN 300 MG PO CAPS
300.0000 mg | ORAL_CAPSULE | Freq: Three times a day (TID) | ORAL | Status: DC
  Administered 2011-08-02 – 2011-08-06 (×11): 300 mg via ORAL
  Filled 2011-08-02 (×13): qty 1

## 2011-08-02 MED ORDER — PANTOPRAZOLE SODIUM 40 MG IV SOLR
40.0000 mg | Freq: Every day | INTRAVENOUS | Status: DC
  Administered 2011-08-02: 40 mg via INTRAVENOUS
  Filled 2011-08-02 (×2): qty 40

## 2011-08-02 MED ORDER — BISACODYL 5 MG PO TBEC
5.0000 mg | DELAYED_RELEASE_TABLET | Freq: Every day | ORAL | Status: DC | PRN
  Administered 2011-08-04 – 2011-08-05 (×2): 5 mg via ORAL
  Filled 2011-08-02 (×2): qty 1

## 2011-08-02 MED ORDER — NEOSTIGMINE METHYLSULFATE 1 MG/ML IJ SOLN
INTRAMUSCULAR | Status: DC | PRN
  Administered 2011-08-02: 3 mg via INTRAVENOUS

## 2011-08-02 MED ORDER — SODIUM CHLORIDE 0.9 % IJ SOLN
3.0000 mL | Freq: Two times a day (BID) | INTRAMUSCULAR | Status: DC
  Administered 2011-08-04 (×2): 3 mL via INTRAVENOUS

## 2011-08-02 MED ORDER — SODIUM CHLORIDE 0.9 % IV SOLN: 250.0000 mL | INTRAVENOUS | Status: DC

## 2011-08-02 MED ORDER — FENTANYL CITRATE 0.05 MG/ML IJ SOLN
INTRAMUSCULAR | Status: DC | PRN
  Administered 2011-08-02: 50 ug via INTRAVENOUS
  Administered 2011-08-02: 100 ug via INTRAVENOUS
  Administered 2011-08-02 (×2): 50 ug via INTRAVENOUS

## 2011-08-02 MED ORDER — LACTATED RINGERS IV SOLN
INTRAVENOUS | Status: DC
  Administered 2011-08-02: 1000 mL via INTRAVENOUS
  Administered 2011-08-02: 15:00:00 via INTRAVENOUS

## 2011-08-02 MED ORDER — OXYMORPHONE HCL ER 40 MG PO TB12: 40.0000 mg | ORAL_TABLET | Freq: Two times a day (BID) | ORAL | Status: DC

## 2011-08-02 MED ORDER — CEFAZOLIN SODIUM-DEXTROSE 2-3 GM-% IV SOLR
2.0000 g | INTRAVENOUS | Status: AC
  Administered 2011-08-02: 2 g via INTRAVENOUS

## 2011-08-02 MED ORDER — MIDAZOLAM HCL 5 MG/5ML IJ SOLN
INTRAMUSCULAR | Status: DC | PRN
  Administered 2011-08-02: 2 mg via INTRAVENOUS

## 2011-08-02 MED ORDER — THROMBIN 5000 UNITS EX SOLR
OROMUCOSAL | Status: DC | PRN
  Administered 2011-08-02: 14:00:00 via TOPICAL

## 2011-08-02 MED ORDER — SENNOSIDES-DOCUSATE SODIUM 8.6-50 MG PO TABS
1.0000 | ORAL_TABLET | Freq: Every evening | ORAL | Status: DC | PRN
  Filled 2011-08-02: qty 1

## 2011-08-02 MED ORDER — ACETAMINOPHEN 650 MG RE SUPP: 650.0000 mg | RECTAL | Status: DC | PRN

## 2011-08-02 MED ORDER — CYCLOBENZAPRINE HCL 10 MG PO TABS
10.0000 mg | ORAL_TABLET | Freq: Three times a day (TID) | ORAL | Status: DC | PRN
  Administered 2011-08-02 – 2011-08-06 (×10): 10 mg via ORAL
  Filled 2011-08-02 (×10): qty 1

## 2011-08-02 MED ORDER — ONDANSETRON HCL 4 MG/2ML IJ SOLN
INTRAMUSCULAR | Status: DC | PRN
  Administered 2011-08-02: 4 mg via INTRAVENOUS

## 2011-08-02 MED ORDER — SODIUM CHLORIDE 0.45 % IV SOLN
INTRAVENOUS | Status: DC
  Administered 2011-08-02 – 2011-08-03 (×2): via INTRAVENOUS

## 2011-08-02 MED ORDER — PROMETHAZINE HCL 25 MG/ML IJ SOLN: 6.2500 mg | INTRAMUSCULAR | Status: DC | PRN

## 2011-08-02 MED ORDER — HYDROMORPHONE HCL PF 1 MG/ML IJ SOLN
1.0000 mg | INTRAMUSCULAR | Status: DC | PRN
  Administered 2011-08-02 (×4): 1 mg via INTRAVENOUS

## 2011-08-02 MED ORDER — BUPIVACAINE-EPINEPHRINE (PF) 0.5% -1:200000 IJ SOLN
INTRAMUSCULAR | Status: AC
  Filled 2011-08-02: qty 10

## 2011-08-02 MED ORDER — HYDROMORPHONE HCL PF 1 MG/ML IJ SOLN
0.5000 mg | INTRAMUSCULAR | Status: DC | PRN
  Administered 2011-08-02 – 2011-08-03 (×3): 1 mg via INTRAVENOUS
  Filled 2011-08-02 (×3): qty 1

## 2011-08-02 MED ORDER — SODIUM CHLORIDE 0.9 % IR SOLN
Status: DC | PRN
  Administered 2011-08-02: 14:00:00

## 2011-08-02 MED ORDER — HYDROMORPHONE HCL PF 1 MG/ML IJ SOLN
1.0000 mg | Freq: Once | INTRAMUSCULAR | Status: AC
  Administered 2011-08-02: 1 mg via INTRAVENOUS

## 2011-08-02 MED ORDER — MEPERIDINE HCL 50 MG/ML IJ SOLN: 6.2500 mg | INTRAMUSCULAR | Status: DC | PRN

## 2011-08-02 MED ORDER — ACETAMINOPHEN 10 MG/ML IV SOLN
INTRAVENOUS | Status: AC
  Filled 2011-08-02: qty 100

## 2011-08-02 MED ORDER — PROPOFOL 10 MG/ML IV BOLUS
INTRAVENOUS | Status: DC | PRN
  Administered 2011-08-02: 200 mg via INTRAVENOUS

## 2011-08-02 MED ORDER — DIAZEPAM 5 MG PO TABS
5.0000 mg | ORAL_TABLET | Freq: Three times a day (TID) | ORAL | Status: DC | PRN
  Administered 2011-08-02 – 2011-08-05 (×7): 5 mg via ORAL
  Filled 2011-08-02 (×7): qty 1

## 2011-08-02 MED ORDER — GABAPENTIN (ONCE-DAILY) 600 MG PO TABS: 900.0000 mg | ORAL_TABLET | Freq: Every day | ORAL | Status: DC

## 2011-08-02 MED ORDER — DOCUSATE SODIUM 100 MG PO CAPS
100.0000 mg | ORAL_CAPSULE | Freq: Two times a day (BID) | ORAL | Status: DC
  Administered 2011-08-02 – 2011-08-05 (×7): 100 mg via ORAL

## 2011-08-02 MED ORDER — BUPIVACAINE-EPINEPHRINE 0.5% -1:200000 IJ SOLN
INTRAMUSCULAR | Status: DC | PRN
  Administered 2011-08-02: 8 mL

## 2011-08-02 MED ORDER — LACTATED RINGERS IV SOLN: INTRAVENOUS | Status: DC

## 2011-08-02 SURGICAL SUPPLY — 51 items
APL SKNCLS STERI-STRIP NONHPOA (GAUZE/BANDAGES/DRESSINGS) ×1
BAG SPEC THK2 15X12 ZIP CLS (MISCELLANEOUS) ×1
BAG ZIPLOCK 12X15 (MISCELLANEOUS) ×2 IMPLANT
BENZOIN TINCTURE PRP APPL 2/3 (GAUZE/BANDAGES/DRESSINGS) ×3 IMPLANT
CHLORAPREP W/TINT 26ML (MISCELLANEOUS) IMPLANT
CLEANER TIP ELECTROSURG 2X2 (MISCELLANEOUS) ×2 IMPLANT
CLOTH BEACON ORANGE TIMEOUT ST (SAFETY) ×2 IMPLANT
DECANTER SPIKE VIAL GLASS SM (MISCELLANEOUS) ×2 IMPLANT
DRAPE LG THREE QUARTER DISP (DRAPES) ×1 IMPLANT
DRAPE MICROSCOPE LEICA (MISCELLANEOUS) ×2 IMPLANT
DRAPE POUCH INSTRU U-SHP 10X18 (DRAPES) ×2 IMPLANT
DRAPE SURG 17X11 SM STRL (DRAPES) ×3 IMPLANT
DRSG AQUACEL AG ADV 3.5X 6 (GAUZE/BANDAGES/DRESSINGS) ×1 IMPLANT
DRSG EMULSION OIL 3X3 NADH (GAUZE/BANDAGES/DRESSINGS) IMPLANT
DRSG PAD ABDOMINAL 8X10 ST (GAUZE/BANDAGES/DRESSINGS) IMPLANT
DRSG TELFA 4X5 ISLAND ADH (GAUZE/BANDAGES/DRESSINGS) IMPLANT
DURAPREP 26ML APPLICATOR (WOUND CARE) ×2 IMPLANT
ELECT REM PT RETURN 9FT ADLT (ELECTROSURGICAL) ×2
ELECTRODE REM PT RTRN 9FT ADLT (ELECTROSURGICAL) ×1 IMPLANT
GLOVE BIOGEL PI IND STRL 8 (GLOVE) ×1 IMPLANT
GLOVE BIOGEL PI INDICATOR 8 (GLOVE) ×1
GLOVE ECLIPSE 6.5 STRL STRAW (GLOVE) ×2 IMPLANT
GLOVE INDICATOR 6.5 STRL GRN (GLOVE) ×4 IMPLANT
GLOVE SURG SS PI 8.0 STRL IVOR (GLOVE) ×4 IMPLANT
GOWN PREVENTION PLUS LG XLONG (DISPOSABLE) ×2 IMPLANT
GOWN STRL REIN XL XLG (GOWN DISPOSABLE) ×2 IMPLANT
KIT BASIN OR (CUSTOM PROCEDURE TRAY) ×2 IMPLANT
KIT POSITIONING SURG ANDREWS (MISCELLANEOUS) ×2 IMPLANT
MANIFOLD NEPTUNE II (INSTRUMENTS) ×2 IMPLANT
NDL SPNL 18GX3.5 QUINCKE PK (NEEDLE) ×3 IMPLANT
NEEDLE SPNL 18GX3.5 QUINCKE PK (NEEDLE) ×6 IMPLANT
PATTIES SURGICAL .5 X.5 (GAUZE/BANDAGES/DRESSINGS) IMPLANT
PATTIES SURGICAL .75X.75 (GAUZE/BANDAGES/DRESSINGS) IMPLANT
PATTIES SURGICAL 1X1 (DISPOSABLE) IMPLANT
SPONGE SURGIFOAM ABS GEL 100 (HEMOSTASIS) ×2 IMPLANT
STAPLER VISISTAT (STAPLE) IMPLANT
STRIP CLOSURE SKIN 1/2X4 (GAUZE/BANDAGES/DRESSINGS) ×1 IMPLANT
SUT PROLENE 3 0 PS 2 (SUTURE) ×1 IMPLANT
SUT VIC AB 0 CT1 27 (SUTURE)
SUT VIC AB 0 CT1 27XBRD ANTBC (SUTURE) IMPLANT
SUT VIC AB 1 CT1 27 (SUTURE) ×2
SUT VIC AB 1 CT1 27XBRD ANTBC (SUTURE) ×1 IMPLANT
SUT VIC AB 1-0 CT2 27 (SUTURE) IMPLANT
SUT VIC AB 2-0 CT1 27 (SUTURE) ×2
SUT VIC AB 2-0 CT1 TAPERPNT 27 (SUTURE) ×1 IMPLANT
SUT VIC AB 2-0 CT2 27 (SUTURE) ×4 IMPLANT
SUT VICRYL 0 27 CT2 27 ABS (SUTURE) ×4 IMPLANT
SUT VICRYL 0 UR6 27IN ABS (SUTURE) IMPLANT
SYRINGE 10CC LL (SYRINGE) ×4 IMPLANT
TRAY LAMINECTOMY (CUSTOM PROCEDURE TRAY) ×2 IMPLANT
YANKAUER SUCT BULB TIP NO VENT (SUCTIONS) ×2 IMPLANT

## 2011-08-02 NOTE — Anesthesia Preprocedure Evaluation (Signed)
Anesthesia Evaluation  Patient identified by MRN, date of birth, ID band Patient awake    Reviewed: Allergy & Precautions, H&P , NPO status , Patient's Chart, lab work & pertinent test results, reviewed documented beta blocker date and time   Airway Mallampati: I TM Distance: >3 FB Neck ROM: Full    Dental  (+) Teeth Intact   Pulmonary neg pulmonary ROS,  breath sounds clear to auscultation        Cardiovascular negative cardio ROS  Rhythm:Regular Rate:Normal  Denies cardiac symptoms   Neuro/Psych negative neurological ROS  negative psych ROS   GI/Hepatic negative GI ROS, Neg liver ROS,   Endo/Other  negative endocrine ROS  Renal/GU negative Renal ROS  negative genitourinary   Musculoskeletal negative musculoskeletal ROS (+)   Abdominal   Peds negative pediatric ROS (+)  Hematology negative hematology ROS (+)   Anesthesia Other Findings   Reproductive/Obstetrics negative OB ROS                           Anesthesia Physical  Anesthesia Plan  ASA: II  Anesthesia Plan: General   Post-op Pain Management:    Induction: Intravenous  Airway Management Planned: Oral ETT  Additional Equipment:   Intra-op Plan:   Post-operative Plan: Extubation in OR  Informed Consent: I have reviewed the patients History and Physical, chart, labs and discussed the procedure including the risks, benefits and alternatives for the proposed anesthesia with the patient or authorized representative who has indicated his/her understanding and acceptance.     Plan Discussed with: CRNA and Surgeon  Anesthesia Plan Comments:         Anesthesia Quick Evaluation

## 2011-08-02 NOTE — Progress Notes (Signed)
Patient insisted that arm bands be right arm because she wants IV in Left.

## 2011-08-02 NOTE — Transfer of Care (Signed)
Immediate Anesthesia Transfer of Care Note  Patient: Judith Hardy  Procedure(s) Performed: Procedure(s) (LRB): LUMBAR LAMINECTOMY/DECOMPRESSION MICRODISCECTOMY (N/A)  Patient Location: PACU  Anesthesia Type: General  Level of Consciousness: awake, alert  and oriented  Airway & Oxygen Therapy: Patient Spontanous Breathing and Patient connected to face mask oxygen  Post-op Assessment: Report given to PACU RN and Post -op Vital signs reviewed and stable  Post vital signs: Reviewed and stable  Complications: No apparent anesthesia complications

## 2011-08-02 NOTE — Brief Op Note (Signed)
08/02/2011  4:25 PM  PATIENT:  Judith Hardy  41 y.o. female  PRE-OPERATIVE DIAGNOSIS:  herniated disc Lumbar 4 - Lumbar 5 Recurrent  POST-OPERATIVE DIAGNOSIS:  herniated disc Lumbar 4 - Lumbar 5 Recurrent  PROCEDURE:  Procedure(s) (LRB): Redo LUMBAR LAMINECTOMY/DECOMPRESSION MICRODISCECTOMY (N/A)  SURGEON:  Surgeon(s) and Role:    * Javier Docker, MD - Primary    * Jacki Cones, MD - Assisting  PHYSICIAN ASSISTANT:   ASSISTANTS: Gioffre   ANESTHESIA:   general  EBL:  Total I/O In: 1000 [I.V.:1000] Out: 550 [Urine:500; Blood:50]  BLOOD ADMINISTERED:none  DRAINS: none   LOCAL MEDICATIONS USED:  MARCAINE     SPECIMEN:  No Specimen  DISPOSITION OF SPECIMEN:  N/A  COUNTS:  YES  TOURNIQUET:  * No tourniquets in log *  DICTATION: .Other Dictation: Dictation Number [id number]  PLAN OF CARE: Admit for overnight observation  PATIENT DISPOSITION:  PACU - hemodynamically stable.   Delay start of Pharmacological VTE agent (>24hrs) due to surgical blood loss or risk of bleeding: yes

## 2011-08-02 NOTE — Anesthesia Postprocedure Evaluation (Signed)
  Anesthesia Post-op Note  Patient: Judith Hardy  Procedure(s) Performed: Procedure(s) (LRB): LUMBAR LAMINECTOMY/DECOMPRESSION MICRODISCECTOMY (N/A)  Patient Location: PACU  Anesthesia Type: General  Level of Consciousness: awake and alert   Airway and Oxygen Therapy: Patient Spontanous Breathing  Post-op Pain: mild  Post-op Assessment: Post-op Vital signs reviewed, Patient's Cardiovascular Status Stable, Respiratory Function Stable, Patent Airway and No signs of Nausea or vomiting  Post-op Vital Signs: stable  Complications: No apparent anesthesia complications

## 2011-08-02 NOTE — H&P (Signed)
Judith Hardy is an 41 y.o. female.   Chief Complaint: LEFT LEG PAIN HPI: Recurrent HNP L45 left.  Past Medical History  Diagnosis Date  . Thyroid enlarged     PT STATES TOLD HER THYROID ENLARGED--BUT NO TX.  PT TAKES OTC THYROID MEDICATION  . Anxiety   . Depression   . Arthritis   . Osteoarthritis     Past Surgical History  Procedure Date  . Appendectomy 2011  . Lumbar laminectomy/decompression microdiscectomy 01/10/2011    Procedure: LUMBAR LAMINECTOMY/DECOMPRESSION MICRODISCECTOMY;  Surgeon: Javier Docker;  Location: [facility];  Service: Orthopedics;  Laterality: N/A;  Decompression L4 - L5  (X-Ray)    No family history on file. Social History:  reports that she has never smoked. She has never used smokeless tobacco. She reports that she does not drink alcohol or use illicit drugs.  Allergies: No Known Allergies  No prescriptions prior to admission    No results found for this or any previous visit (from the past 48 hour(s)). MRI recurrent disc left L45. No results found.  Review of Systems  HENT: Negative.   Eyes: Negative.   Respiratory: Negative.   Cardiovascular: Negative.   Genitourinary: Negative.   Musculoskeletal: Positive for back pain.  Skin: Negative.   Neurological: Positive for tingling, focal weakness and weakness.  Endo/Heme/Allergies: Negative.   Psychiatric/Behavioral: Negative.   All other systems reviewed and are negative.    There were no vitals taken for this visit. Physical Exam  Vitals reviewed. Constitutional: She is oriented to person, place, and time. She appears well-developed.  HENT:  Head: Normocephalic and atraumatic.  Eyes: Pupils are equal, round, and reactive to light.  Neck: Normal range of motion. Neck supple.  Cardiovascular: Normal rate and regular rhythm.   Respiratory: Effort normal and breath sounds normal.  GI: Soft.  Musculoskeletal:       +SLR left. Decreased sensation L4 L5 left. No dvt. ehl 4/5 LEFT    Neurological: She is alert and oriented to person, place, and time.  Skin: Skin is warm and dry.  Psychiatric: She has a normal mood and affect.     Assessment/Plan Recurrent HNP L45 left. Plan decompression. Risks discussed.  Jermaine Tholl C 08/02/2011, 10:40 AM

## 2011-08-03 ENCOUNTER — Observation Stay (HOSPITAL_COMMUNITY): Payer: PRIVATE HEALTH INSURANCE

## 2011-08-03 ENCOUNTER — Encounter (HOSPITAL_COMMUNITY): Payer: Self-pay | Admitting: Specialist

## 2011-08-03 MED ORDER — PANTOPRAZOLE SODIUM 40 MG PO TBEC
40.0000 mg | DELAYED_RELEASE_TABLET | Freq: Every day | ORAL | Status: DC
  Administered 2011-08-03 – 2011-08-05 (×3): 40 mg via ORAL
  Filled 2011-08-03 (×4): qty 1

## 2011-08-03 MED ORDER — HYDROMORPHONE HCL 2 MG PO TABS
8.0000 mg | ORAL_TABLET | Freq: Four times a day (QID) | ORAL | Status: DC | PRN
  Administered 2011-08-03 – 2011-08-06 (×12): 8 mg via ORAL
  Filled 2011-08-03 (×12): qty 4

## 2011-08-03 NOTE — Evaluation (Signed)
Occupational Therapy Evaluation Patient Details Name: Judith Hardy MRN: [id number] DOB: 09-08-1970 Today's Date: 08/03/2011 Time: 0865-7846 OT Time Calculation (min): 24 min  OT Assessment / Plan / Recommendation Clinical Impression  Pt is a 41 yo female s/p decompression L4-L5, microdiscectomy L4-L5. Pt experiencing significant pain which is limiting mobility. Pt required MAX encouragement for participation. Skilled OT indicated to maximize independence with BADLs to min A/supervision level in prep for d/c home with 24/7 A and HHOT.    OT Assessment  Patient needs continued OT Services    Follow Up Recommendations  Home health OT;Supervision/Assistance - 24 hour    Barriers to Discharge      Equipment Recommendations  Rolling walker with 5" wheels;3 in 1 bedside comode    Recommendations for Other Services    Frequency  Min 2X/week    Precautions / Restrictions Precautions Precautions: Back Precaution Comments: Reviewed back precautions and logroll technique Required Braces or Orthoses: Spinal Brace Spinal Brace: Lumbar corset (No in room on eval. Dr Shelle Iron cleared pt for mobiliztion w) Restrictions Weight Bearing Restrictions: No   Pertinent Vitals/Pain Reported 10/10 back pain. Pt premedicated and repositioned in chair.    ADL  Grooming: Simulated;Set up Where Assessed - Grooming: Unsupported sitting Lower Body Bathing: Simulated;+2 Total assistance Lower Body Bathing: Patient Percentage: 50% Where Assessed - Lower Body Bathing: Supported sit to stand Lower Body Dressing: Simulated;+2 Total assistance Lower Body Dressing: Patient Percentage: 50% Where Assessed - Lower Body Dressing: Supported sit to stand Toilet Transfer: Simulated;+2 Total assistance Toilet Transfer: Patient Percentage: 50% Statistician Method: Sit to Barista: Other (comment) Nurse, children's) Toileting - Clothing Manipulation and Hygiene: Simulated;+2 Total assistance Toileting  - Clothing Manipulation and Hygiene: Patient Percentage: 50% Equipment Used: Rolling walker Transfers/Ambulation Related to ADLs: Pt ambulated ~15 feet with RW and +2 assist. Pt tearful, crying. ADL Comments: Eval limited 2* pain. Reviewed back precautions. Provided handout.    OT Diagnosis: Generalized weakness  OT Problem List: Decreased activity tolerance;Decreased safety awareness;Decreased knowledge of use of DME or AE;Pain;Decreased knowledge of precautions OT Treatment Interventions: Self-care/ADL training;Therapeutic activities;DME and/or AE instruction;Patient/family education   OT Goals Acute Rehab OT Goals OT Goal Formulation: With patient Time For Goal Achievement: 08/10/11 Potential to Achieve Goals: Good ADL Goals Pt Will Perform Grooming: with supervision;Standing at sink ADL Goal: Grooming - Progress: Goal set today Pt Will Perform Lower Body Bathing: with min assist;Sit to stand from chair;Sit to stand from bed;with adaptive equipment ADL Goal: Lower Body Bathing - Progress: Goal set today Pt Will Perform Lower Body Dressing: with min assist;Sit to stand from chair;Sit to stand from bed;with adaptive equipment ADL Goal: Lower Body Dressing - Progress: Goal set today Pt Will Transfer to Toilet: with min assist;Ambulation;Comfort height toilet;3-in-1 ADL Goal: Toilet Transfer - Progress: Goal set today Pt Will Perform Toileting - Clothing Manipulation: with min assist;Standing ADL Goal: Toileting - Clothing Manipulation - Progress: Goal set today Pt Will Perform Toileting - Hygiene: with min assist;Sit to stand from 3-in-1/toilet ADL Goal: Toileting - Hygiene - Progress: Goal set today Miscellaneous OT Goals Miscellaneous OT Goal #1: Pt will verbalize 3/3 back precautions and incorporate into ADL activity with min VCs. OT Goal: Miscellaneous Goal #1 - Progress: Goal set today  Visit Information  Last OT Received On: 08/03/11 Assistance Needed: +2 PT/OT  Co-Evaluation/Treatment: Yes    Subjective Data  Subjective: I can't do this! I'm gonna fall off the bed. Patient Stated Goal: Not asked   Prior Functioning  Vision/Perception  Home Living Lives With: Spouse Available Help at Discharge: Family;Available 24 hours/day Type of Home: House Home Access: Stairs to enter [employer] of Steps: 7 Entrance Stairs-Rails: Right;Left Home Layout: Multi-level;Able to live on main level with bedroom/bathroom Bathroom Shower/Tub: Health visitor: Standard Home Adaptive Equipment: Walker - rolling;Straight cane;Bedside commode/3-in-1 Prior Function Level of Independence: Independent with assistive device(s) Able to Take Stairs?: Yes Driving: Yes Vocation: Unemployed Communication Communication: No difficulties Dominant Hand: Right      Cognition  Overall Cognitive Status: Appears within functional limits for tasks assessed/performed Arousal/Alertness: Awake/alert Orientation Level: Appears intact for tasks assessed Behavior During Session: Anxious    Extremity/Trunk Assessment Right Upper Extremity Assessment RUE ROM/Strength/Tone: [facility] for tasks assessed Left Upper Extremity Assessment LUE ROM/Strength/Tone: WFL for tasks assessed .    Mobility Bed Mobility Bed Mobility: Rolling Left;Left Sidelying to Sit Rolling Left: 1: +2 Total assist Rolling Left: Patient Percentage: 60% Left Sidelying to Sit: 1: +2 Total assist Left Sidelying to Sit: Patient Percentage: 40% Details for Bed Mobility Assistance: VCs safety, technique, hand placement. Utilized bedpad to complete full motion of roll. Assist for bil LEs off bed and trunk to upright. Increased time.  Transfers Sit to Stand: 1: +2 Total assist;From elevated surface;With upper extremity assist;From bed Sit to Stand: Patient Percentage: 50% Stand to Sit: 1: +2 Total assist;To chair/3-in-1;With upper extremity assist;With armrests Stand to Sit: Patient  Percentage: 50% Details for Transfer Assistance: VCs safety, technique, hand placement. Assist to rise, stabilize, control descent. Increased time.    Exercise    Balance    End of Session OT - End of Session Activity Tolerance: Patient limited by pain Patient left: in chair;with call bell/phone within reach;with family/visitor present  GO Functional Assessment Tool Used: Clinical Judgement Functional Limitation: Self care Self Care Current Status (H8469): At least 60 percent but less than 80 percent impaired, limited or restricted Self Care Goal Status (G2952): At least 1 percent but less than 20 percent impaired, limited or restricted   Mattie Nordell A OTR/L [phone] 08/03/2011, 10:36 AM

## 2011-08-03 NOTE — Op Note (Signed)
Judith Hardy, Judith Hardy               ACCOUNT NO.:  [id number]  MEDICAL RECORD NO.:  [id number]  LOCATION:  1614                         FACILITY:  [facility]  PHYSICIAN:  Jene Every, M.D.    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  PROCEDURE: 1. Redo micro lumbar decompression L4-5, left with foraminotomies at     L4 and L5. 2. Microdiskectomy, L4-L5.  ANESTHESIA:  General.  ASSISTANT:  Georges Lynch. Darrelyn Hillock, M.D.  Difficulty to elevate due to the patient's morbid obesity.  The patient at 42 kg.  HISTORY:  This is a 41 year old, doing well after decompression, squat and fell, had a recurrent disk herniation, foraminal with severe pain, indicated for redo decompression.  Risks and benefits discussed including bleeding, infection, damage to the vascular structures, no change in symptoms, worsening symptoms, need for repeat decompression, DVT, PE, anesthetic complications, etc.  TECHNIQUE:  With the patient in supine position, after induction of adequate anesthesia, and 2 g of Kefzol, she was placed prone on the Kennard frame.  All bony prominences were well padded.  Lumbar region was prepped and draped in the usual sterile fashion.  Previous surgical scar with keloid was excised.  Subcutaneous tissue was dissected. Electrocautery was utilized to achieve hemostasis.  She had ample subcutaneous adipose tissue.  Dorsolumbar fascia identified, divided in line with skin incision.  Paraspinous muscle elevated from lamina of 4 and 5.  Due to the patient's obesity and the increased lumbosacral angle, multiple radiographs were obtained to determine and confirm the radiographs level.  The scar tissue and epidural fibrosis was abundant. We used a curette to skeletonized the  previous laminotomy of 4 and 5. We used a micro and a straight curette to mobilize the epidural fibrosis and thecal sac from the laminotomy defect.  The large foraminotomy of 5 and also of 4, as there was stenosis at 2, 4 multifactorial.  I identified the pedicle of 5 and elevating the scar tissue from the superior medial aspect of the pedicle, identified the disk space.  We entered the disk space with a Penfield and a small Ebstein, which was then mobilized bilaterally, medially, and cephalad.  We then performed a diskectomy with multiple passes into the foramen and medially.  This decompressed the L4, which was erythematous and edematous as we did the fibroid.  Previously she had some mobility.  It appeared that she may have developed pars defect with initial inspection.  This was noted just prior to the laminotomy during the mobilization of the soft tissue.  She did have an injury postoperatively.  Did felt though that there was adequate decompression of 4 and of the 5 root.  Bipolar electrocautery was utilized to achieve hemostasis.  We obtained a confirmatory radiograph, which was initially read at the level of 3-4, but re- consulted the radiologist, confirmed 4-5 disk space, copiously irrigated with antibiotic irrigation.  No evidence of CSF leakage or active bleeding.  Thrombin-soaked Gelfoam was placed in laminotomy defect. McCullough retractor was  removed.  Paraspinous muscles inspected.  No evidence of active bleeding.  Dorsolumbar fascia reapproximated with 1 Vicryl interrupted figure-of-eight sutures, subcu with 2-0 Vicryl simple suture in multiple layers.  Skin was reapproximated with 4-0 subcuticular Prolene.  Wound was reinforced with Steri-Strips.  Sterile dressing applied.  Placed supine on the hospital bed, extubated without difficulty, and transported to the recovery room in satisfactory condition.  The patient tolerated the procedure well.  No complications.   Minimal blood loss.     Jene Every, M.D.     [name]  D:  08/02/2011  T:  08/03/2011  Job:  [id number]

## 2011-08-03 NOTE — Progress Notes (Addendum)
Subjective: 1 Day Post-Op Procedure(s) (LRB): LUMBAR LAMINECTOMY/DECOMPRESSION MICRODISCECTOMY (N/A) Patient reports pain as 8 on 0-10 scale.    Objective: Vital signs in last 24 hours: Temp:  [97.5 F (36.4 C)-99.1 F (37.3 C)] 98.7 F (37.1 C) (07/26 0520) Pulse Rate:  [57-97] 97  (07/26 0520) Resp:  [16-21] 16  (07/26 0520) BP: (120-144)/(60-93) 124/81 mmHg (07/26 0520) SpO2:  [82 %-100 %] 97 % (07/26 0520) Weight:  [90.719 kg (200 lb)] 90.719 kg (200 lb) (07/25 1745)  Intake/Output from previous day: 07/25 0701 - 07/26 0700 In: 2811.3 [I.V.:2711.3; IV Piggyback:100] Out: 3450 [Urine:3400; Blood:50] Intake/Output this shift:    No results found for this basename: HGB:5 in the last 72 hours No results found for this basename: WBC:2,RBC:2,HCT:2,PLT:2 in the last 72 hours No results found for this basename: NA:2,K:2,CL:2,CO2:2,BUN:2,CREATININE:2,GLUCOSE:2,CALCIUM:2 in the last 72 hours No results found for this basename: LABPT:2,INR:2 in the last 72 hours  Sensation intact distally Intact pulses distally Dorsiflexion/Plantar flexion intact Incision: dressing C/D/I Compartment soft. Abdomen slightly distended. No rebound. Tingling left leg. Pelvis pain for 2wks. History of postpartum symph pain. Perineal sensation intact.  Assessment/Plan: 1 Day Post-Op Procedure(s) (LRB): LUMBAR LAMINECTOMY/DECOMPRESSION MICRODISCECTOMY (N/A) Up with therapy. KUB. Pelvis xray. Possible ileus. Foley in until this pm.  Punam Broussard C 08/03/2011, 7:25 AM  KUB mild to moderate ileus.Pelvis negative. Plan mobilization. Decreased narcotics and bowel regiment.

## 2011-08-03 NOTE — Evaluation (Signed)
Physical Therapy Evaluation Patient Details Name: Judith Hardy MRN: [id number] DOB: 07/23/1970 Today's Date: 08/03/2011 Time: 1478-2956 PT Time Calculation (min): 27 min  PT Assessment / Plan / Recommendation Clinical Impression  41 yo female s/p decompression L4-L5, microdiscectomy L4-L5. Pt experiencing significant pain which is limiting mobility. Spoke with Dr Shelle Iron over phone-cleared pt for mobility without corset brace (brace for comfort). MAX encouragment for participation with PT eval-pt tearful and fearful because of pain.     PT Assessment  Patient needs continued PT services    Follow Up Recommendations  Home health PT;Supervision/Assistance - 24 hour    Barriers to Discharge        Equipment Recommendations  Rolling walker with 5" wheels    Recommendations for Other Services OT consult   Frequency Min 5X/week    Precautions / Restrictions Precautions Precautions: Back Precaution Comments: Reviewed back precautions and logroll technique Required Braces or Orthoses: Spinal Brace Spinal Brace: Lumbar corset (Not in room on eval. Dr Shelle Iron cleared pt for mobiliztion without brace. Brace is for comfort. Restrictions Weight Bearing Restrictions: No   Pertinent Vitals/Pain       Mobility  Bed Mobility Bed Mobility: Rolling Left;Left Sidelying to Sit Rolling Left: 1: +2 Total assist Rolling Left: Patient Percentage: 60% Left Sidelying to Sit: 1: +2 Total assist Left Sidelying to Sit: Patient Percentage: 40% Details for Bed Mobility Assistance: VCs safety, technique, hand placement. Utilized bedpad to complete full motion of roll. Assist for bil LEs off bed and trunk to upright. Increased time.  Transfers Transfers: Sit to Stand;Stand to Sit Sit to Stand: 1: +2 Total assist;From elevated surface;With upper extremity assist;From bed Sit to Stand: Patient Percentage: 50% Stand to Sit: 1: +2 Total assist;To chair/3-in-1;With upper extremity assist;With armrests Stand  to Sit: Patient Percentage: 50% Details for Transfer Assistance: VCs safety, technique, hand placement. Assist to rise, stabilize, control descent. Increased time.  Ambulation/Gait Ambulation/Gait Assistance: 1: +2 Total assist Ambulation/Gait: Patient Percentage: 70% Ambulation Distance (Feet): 15 Feet Assistive device: Rolling walker Ambulation/Gait Assistance Details: VCs safety, technique, posture. Assist to stabilize and navigate with RW. Slow gait speed. Limited activity tolerance.  Gait Pattern: Step-through pattern;Decreased stride length;Decreased step length - right;Decreased step length - left    Exercises     PT Diagnosis: Difficulty walking;Acute pain  PT Problem List: Decreased activity tolerance;Decreased mobility;Pain;Decreased knowledge of precautions;Decreased knowledge of use of DME PT Treatment Interventions: DME instruction;Gait training;Stair training;Functional mobility training;Therapeutic activities;Therapeutic exercise;Patient/family education   PT Goals Acute Rehab PT Goals PT Goal Formulation: With patient/family Time For Goal Achievement: 08/10/11 Potential to Achieve Goals: Good Pt will Roll Supine to Left Side: with min assist PT Goal: Rolling Supine to Left Side - Progress: Goal set today Pt will go Supine/Side to Sit: with min assist PT Goal: Supine/Side to Sit - Progress: Goal set today Pt will go Sit to Supine/Side: with min assist PT Goal: Sit to Supine/Side - Progress: Goal set today Pt will go Sit to Stand: with min assist PT Goal: Sit to Stand - Progress: Goal set today Pt will Ambulate: 51 - 150 feet;with min assist;with least restrictive assistive device PT Goal: Ambulate - Progress: Goal set today Pt will Go Up / Down Stairs: 6-9 stairs;with min assist;with rail(s) (7 steps) PT Goal: Up/Down Stairs - Progress: Goal set today  Visit Information  Last PT Received On: 08/03/11 Assistance Needed: +2    Subjective Data  Subjective: "I can't  believe you all are doing this to me" Patient  Stated Goal: Less pain.    Prior Functioning  Home Living Lives With: Spouse Available Help at Discharge: Family;Available 24 hours/day Type of Home: House Home Access: Stairs to enter [employer] of Steps: 7 Entrance Stairs-Rails: Right;Left Home Layout: Multi-level;Able to live on main level with bedroom/bathroom Bathroom Shower/Tub: Health visitor: Standard Home Adaptive Equipment: Walker - rolling;Straight cane;Bedside commode/3-in-1 Prior Function Level of Independence: Independent with assistive device(s) Able to Take Stairs?: Yes Driving: Yes Vocation: Unemployed Communication Communication: No difficulties Dominant Hand: Right    Cognition  Overall Cognitive Status: Appears within functional limits for tasks assessed/performed Arousal/Alertness: Awake/alert Orientation Level: Appears intact for tasks assessed Behavior During Session: [facility] for tasks performed    Extremity/Trunk Assessment Right Lower Extremity Assessment RLE ROM/Strength/Tone: Unable to fully assess;Due to pain;Deficits RLE ROM/Strength/Tone Deficits: Pt able to weightbear for ambulation Left Lower Extremity Assessment LLE ROM/Strength/Tone: Deficits;Unable to fully assess;Due to pain LLE ROM/Strength/Tone Deficits: Pt able to weightbear for ambulation.    Balance    End of Session PT - End of Session Activity Tolerance: Patient limited by pain Patient left: in chair;with call bell/phone within reach;with family/visitor present  GP Functional Assessment Tool Used: Clinical judgement Functional Limitation: Mobility: Walking and moving around Mobility: Walking and Moving Around Current Status (R6045): At least 60 percent but less than 80 percent impaired, limited or restricted Mobility: Walking and Moving Around Goal Status [phone]): At least 20 percent but less than 40 percent impaired, limited or restricted   Rebeca Alert  [facility] 08/03/2011, 10:04 AM [phone]

## 2011-08-04 NOTE — Progress Notes (Addendum)
Physical Therapy Treatment Patient Details Name: Judith Hardy MRN: [id number] DOB: Apr 12, 1970 Today's Date: 08/04/2011 Time: 3086-5784 PT Time Calculation (min): 23 min  PT Assessment / Plan / Recommendation Comments on Treatment Session  Pt progressing with mobility.Ambulated 100' with RW, improved bed mobility. Increased time for all mobility. Pt will need to be able to ascend a flight of stairs to get to bedroom.     Follow Up Recommendations  Home health PT;Supervision/Assistance - 24 hour    Barriers to Discharge        Equipment Recommendations  Rolling walker with 5" wheels    Recommendations for Other Services OT consult  Frequency Min 5X/week   Plan Discharge plan remains appropriate;Frequency remains appropriate    Precautions / Restrictions Precautions Precautions: Back Precaution Comments: Reviewed back precautions and logroll technique Required Braces or Orthoses: Spinal Brace Spinal Brace: Lumbar corset  Weight Bearing Restrictions: No   Pertinent Vitals/Pain *9/10 LBP premedicated**    Mobility  Bed Mobility Bed Mobility: Rolling Right;Right Sidelying to Sit Rolling Right: 4: Min assist;With rail Right Sidelying to Sit: 4: Min assist;With rails Left Sidelying to Sit: 3: Mod assist Left Sidelying to Sit: Patient Percentage: 60% Details for Bed Mobility Assistance: VCs safety, technique, hand placement. Utilized bedpad to scoot to EOBl. Assist for bil LEs off bed and trunk to upright. Increased time.  Transfers Transfers: Sit to Stand;Stand to Sit Sit to Stand: From elevated surface;With upper extremity assist;From bed;4: Min assist Sit to Stand: Patient Percentage: 80% Stand to Sit: To chair/3-in-1;With upper extremity assist;With armrests;4: Min assist Stand to Sit: Patient Percentage: 90% Details for Transfer Assistance: VCs safety, technique, hand placement. Assist to rise, stabilize, control descent. Increased time.  Ambulation/Gait Ambulation/Gait  Assistance: 4: Min guard Ambulation Distance (Feet): 100 Feet Assistive device: Rolling walker Gait Pattern: Step-through pattern General Gait Details: VCs for positioning in RW, increased time    Exercises     PT Diagnosis:    PT Problem List:   PT Treatment Interventions:     PT Goals Acute Rehab PT Goals PT Goal Formulation: With patient/family Time For Goal Achievement: 08/10/11 Potential to Achieve Goals: Good Pt will Roll Supine to Left Side: with min assist Pt will go Supine/Side to Sit: with min assist PT Goal: Supine/Side to Sit - Progress: Progressing toward goal Pt will go Sit to Supine/Side: with min assist Pt will go Sit to Stand: with min assist PT Goal: Sit to Stand - Progress: Met Pt will Ambulate: 51 - 150 feet;with min assist;with least restrictive assistive device PT Goal: Ambulate - Progress: Met Pt will Go Up / Down Stairs: 6-9 stairs;with min assist;with rail(s) (7 steps)  Visit Information  Last PT Received On: 08/04/11 Assistance Needed: +1    Subjective Data  Subjective: I know I need to get up again.  Patient Stated Goal: walk farther   Cognition  Overall Cognitive Status: Appears within functional limits for tasks assessed/performed Arousal/Alertness: Awake/alert Orientation Level: Appears intact for tasks assessed Behavior During Session: [facility] for tasks performed    Balance     End of Session PT - End of Session Equipment Utilized During Treatment: Back brace Activity Tolerance: Patient limited by pain (pt became dizzy with walking) Patient left: in chair;with call bell/phone within reach;with family/visitor present   GP     Ralene Bathe Kistler 08/04/2011, 2:22 PM [phone]

## 2011-08-04 NOTE — Progress Notes (Signed)
Subjective: 2 Days Post-Op Procedure(s) (LRB): LUMBAR LAMINECTOMY/DECOMPRESSION MICRODISCECTOMY (N/A) Patient reports pain as 8 on 0-10 scale.  No BM or flatus.   Objective: Vital signs in last 24 hours: Temp:  [98.2 F (36.8 C)-102.8 F (39.3 C)] 99.1 F (37.3 C) (07/27 0704) Pulse Rate:  [80-99] 99  (07/27 0935) Resp:  [14-16] 14  (07/27 0704) BP: (98-136)/(65-84) 126/78 mmHg (07/27 0935) SpO2:  [93 %-97 %] 96 % (07/27 0935)  Intake/Output from previous day: 07/26 0701 - 07/27 0700 In: 130 [P.O.:130] Out: 1200 [Urine:1200] Intake/Output this shift: Total I/O In: 123 [P.O.:120; I.V.:3] Out: -   No results found for this basename: HGB:5 in the last 72 hours No results found for this basename: WBC:2,RBC:2,HCT:2,PLT:2 in the last 72 hours No results found for this basename: NA:2,K:2,CL:2,CO2:2,BUN:2,CREATININE:2,GLUCOSE:2,CALCIUM:2 in the last 72 hours No results found for this basename: LABPT:2,INR:2 in the last 72 hours  Abdomen slight tenderness and distention Lumbar incision benign .  Dorsiflexion and plantar flexion intact able to wiggle toes. Calves supple non tender Slight decrease sensation left dorsal foot  Assessment/Plan: 2 Days Post-Op Procedure(s) (LRB): LUMBAR LAMINECTOMY/DECOMPRESSION MICRODISCECTOMY (N/A) Ileus Up with therapy Possible d/c on Sunday to home   [place], Daryon Remmert 08/04/2011, 11:09 AM

## 2011-08-04 NOTE — Progress Notes (Signed)
In to see patient for OT session. Pt stated she was in 7/10 pain in her back, called RN. Pt willing to get up with me even with 7/10 pain; however as I started to get her bed read to get up she said, "Now why again to I have to get up". I explained that in order for me to show and demonstrate the AE equipment to her and then let her practice with it, she needed to be sitting up and then we needed to practice the toilet transfer. She said that the last time she was able to cross one leg over the other do get her socks on/off and does not foresee any issues with doing it this way again and even if she does she says she will have A prn or wait until someone can A her. She reports she wears house dresses and no underwear so she does not foresee any issues with lower body dressing. From a bathing standpoint she says she waited last time until she felt comfortable stepping into/out of the shower and/or made sure someone was there to A her and she plans on doing this this time as well--did recommend a hand held shower ("yes, they recommended this last time"--she did not get one).Reports she does not foresee any issues with toileting since she has 2 (3-n-1s)--one upstairs and one downstairs and did not have issues before. Asked her if there was anything she was concerned about from a self-care perspective that we had not covered and she said, "not that she could think of". Acute OT signing off.

## 2011-08-04 NOTE — Progress Notes (Signed)
Physical Therapy Treatment Patient Details Name: Judith Hardy MRN: [id number] DOB: [birth year] Today's Date: 08/04/2011 Time: 4098-1191 PT Time Calculation (min): 24 min  PT Assessment / Plan / Recommendation Comments on Treatment Session  Pt progressing with mobility. She ambulated 59' with RW. Distance limited by dizziness. BP 126/78, HR 99, SaO2 96% immediately after walking. Pain 10/10. Increased time for all mobility. Pt will need to be able to ascend a flight of stairs to get to bedroom.     Follow Up Recommendations  Home health PT;Supervision/Assistance - 24 hour    Barriers to Discharge        Equipment Recommendations  Rolling walker with 5" wheels    Recommendations for Other Services OT consult  Frequency Min 5X/week   Plan Discharge plan remains appropriate;Frequency remains appropriate    Precautions / Restrictions Precautions Precautions: Back Precaution Comments: Reviewed back precautions and logroll technique Required Braces or Orthoses: Spinal Brace Spinal Brace: Lumbar corset (No in room on eval. Dr Shelle Iron cleared pt for mobiliztion w) Restrictions Weight Bearing Restrictions: No   Pertinent Vitals/Pain *10/10 low back pain with walking BP 126/78, HR 99, SaO2 96% on RA immediately after walking**    Mobility  Bed Mobility Bed Mobility: Left Sidelying to Sit Left Sidelying to Sit: 3: Mod assist Left Sidelying to Sit: Patient Percentage: 60% Details for Bed Mobility Assistance: VCs safety, technique, hand placement. Utilized bedpad to scoot to EOBl. Assist for bil LEs off bed and trunk to upright. Increased time.  Transfers Transfers: Sit to Stand;Stand to Sit Sit to Stand: 1: +2 Total assist;From elevated surface;With upper extremity assist;From bed Sit to Stand: Patient Percentage: 70% Stand to Sit: To chair/3-in-1;With upper extremity assist;With armrests;4: Min assist Stand to Sit: Patient Percentage: 90% Details for Transfer Assistance: VCs safety,  technique, hand placement. Assist to rise, stabilize, control descent. Increased time.  Ambulation/Gait Ambulation/Gait Assistance: 4: Min guard Ambulation Distance (Feet): 34 Feet Assistive device: Rolling walker Gait Pattern: Step-through pattern;Decreased step length - right;Decreased step length - left    Exercises     PT Diagnosis:    PT Problem List:   PT Treatment Interventions:     PT Goals Acute Rehab PT Goals PT Goal Formulation: With patient/family Time For Goal Achievement: 08/10/11 Potential to Achieve Goals: Good Pt will Roll Supine to Left Side: with min assist Pt will go Supine/Side to Sit: with min assist PT Goal: Supine/Side to Sit - Progress: Progressing toward goal Pt will go Sit to Supine/Side: with min assist Pt will go Sit to Stand: with min assist PT Goal: Sit to Stand - Progress: Progressing toward goal Pt will Ambulate: 51 - 150 feet;with min assist;with least restrictive assistive device PT Goal: Ambulate - Progress: Progressing toward goal Pt will Go Up / Down Stairs: 6-9 stairs;with min assist;with rail(s) (7 steps)  Visit Information  Last PT Received On: 08/04/11 Assistance Needed: +2    Subjective Data  Subjective: I'm dizzy. (with walking) Patient Stated Goal: DC home   Cognition  Overall Cognitive Status: Appears within functional limits for tasks assessed/performed Arousal/Alertness: Awake/alert Orientation Level: Appears intact for tasks assessed Behavior During Session: Anxious    Balance     End of Session PT - End of Session Activity Tolerance: Patient limited by pain;Treatment limited secondary to medical complications (Comment) (pt became dizzy with walking) Patient left: in chair;with call bell/phone within reach;with family/visitor present   GP     Tamala Ser 08/04/2011, 9:42 AM [phone]

## 2011-08-04 NOTE — Progress Notes (Signed)
Cm spoke with patient with spouse at bedside concerning dc planning. Per pt choice [facility] to provide [facility] services upon discharge. Per pt, has RW, cane, & 3n1 at home for use. Spouse to assist in home care. No other needs stated at this time. [facility] notified of new referral. Demographics, H/P, faxed to [facility] at [phone].   Judith Hardy [phone]

## 2011-08-05 NOTE — Progress Notes (Signed)
Subjective: 3 Days Post-Op Procedure(s) (LRB): LUMBAR LAMINECTOMY/DECOMPRESSION MICRODISCECTOMY (N/A) Patient reports pain as moderate.  Moving better with PT yesterday.  Not feeling up to going home.  Objective: Vital signs in last 24 hours: Temp:  [98.4 F (36.9 C)-99.9 F (37.7 C)] 98.4 F (36.9 C) (07/28 0616) Pulse Rate:  [87-100] 87  (07/28 0616) Resp:  [18] 18  (07/28 0616) BP: (114-129)/(76-85) 114/76 mmHg (07/28 0616) SpO2:  [96 %-98 %] 98 % (07/28 0616)  Intake/Output from previous day: 07/27 0701 - 07/28 0700 In: 846 [P.O.:840; I.V.:6] Out: 1100 [Urine:1100]     Back wound dressed and dry.  5/5 strength in PF and DF at ankles and hallux.  Feels LT at dorsum of foot and lateral leg.  Assessment/Plan: 3 Days Post-Op Procedure(s) (LRB): LUMBAR LAMINECTOMY/DECOMPRESSION MICRODISCECTOMY (N/A) Up with therapy  Hopefully home tomorrow.   [name] 08/05/2011, 8:20 AM

## 2011-08-05 NOTE — Progress Notes (Addendum)
Physical Therapy Treatment Patient Details Name: Judith Hardy MRN: [id number] DOB: [birth year] Today's Date: 08/05/2011 Time: 0930-1000 PT Time Calculation (min): 30 min  PT Assessment / Plan / Recommendation Comments on Treatment Session  Increased time for all movement. Pt seems groggy, but required less assistance for sidelying to sit. Pt became dizzy and had posterior LOB while walking. Will need to ascend a flight of stairs to get to bedroom.     Follow Up Recommendations  Home health PT;Supervision/Assistance - 24 hour    Barriers to Discharge        Equipment Recommendations  Rolling walker with 5" wheels    Recommendations for Other Services OT consult  Frequency Min 5X/week   Plan Discharge plan remains appropriate;Frequency remains appropriate    Precautions / Restrictions Precautions Precautions: Back Precaution Comments: Reviewed back precautions and logroll technique Required Braces or Orthoses: Spinal Brace Spinal Brace: Lumbar corset  Restrictions Weight Bearing Restrictions: No   Pertinent Vitals/Pain *9/10 back pain, pt premedicated BP 107/72, HR 92, SaO2 97% on RA after becoming dizzy while walking**    Mobility  Bed Mobility Bed Mobility: Right Sidelying to Sit;Rolling Left Rolling Left: 5: Supervision;With rail Left Sidelying to Sit: 4: Min guard;HOB elevated;With rails (HOB 15*) Left Sidelying to Sit: Patient Percentage: 60% Details for Bed Mobility Assistance: VCs safety, technique, hand placement. Utilized bedpad to scoot to EOBl. Assist for bil LEs off bed and trunk to upright. Increased time.  Transfers Transfers: Sit to Stand;Stand to Sit Sit to Stand: With upper extremity assist;From bed;4: Min assist Sit to Stand: Patient Percentage: 90% Stand to Sit: To chair/3-in-1;With upper extremity assist;With armrests;5: Supervision Details for Transfer Assistance: VCs safety, technique, hand placement. Assist to rise, stabilize. Increased time.    Ambulation/Gait Ambulation/Gait Assistance: 4: Min guard;4: Min Environmental consultant (Feet): 60 Feet Assistive device: Rolling walker Gait Pattern: Step-through pattern Gait velocity: slow General Gait Details: Pt had posterior LOB x1 which required min A to regain balance. Pt stated she felt dizzy. Assisted pt to chair, VSS. RN notified.    Exercises     PT Diagnosis:    PT Problem List:   PT Treatment Interventions:     PT Goals Acute Rehab PT Goals PT Goal Formulation: With patient/family Time For Goal Achievement: 08/10/11 Potential to Achieve Goals: Good Pt will Roll Supine to Left Side: with min assist PT Goal: Rolling Supine to Left Side - Progress: Met Pt will go Supine/Side to Sit: with min assist Pt will go Sit to Supine/Side: with min assist PT Goal: Sit to Supine/Side - Progress: Met Pt will go Sit to Stand: with min assist PT Goal: Sit to Stand - Progress: Met Pt will Ambulate: 51 - 150 feet;with min assist;with least restrictive assistive device PT Goal: Ambulate - Progress: Met Pt will Go Up / Down Stairs: 6-9 stairs;with min assist;with rail(s) (7 steps)  Visit Information  Last PT Received On: 08/05/11 Assistance Needed: +2    Subjective Data  Subjective: I like to do this by myself (get OOB). Patient Stated Goal: be able to play with 2 y.o. son   Cognition  Overall Cognitive Status: Appears within functional limits for tasks assessed/performed Arousal/Alertness: Awake/alert Orientation Level: Appears intact for tasks assessed Behavior During Session: [facility] for tasks performed    Balance     End of Session PT - End of Session Equipment Utilized During Treatment: Back brace Activity Tolerance: Patient limited by pain;Treatment limited secondary to medical complications (Comment) (pt became dizzy  with walking) Patient left: in chair;with call bell/phone within reach;with family/visitor present   GP     Tamala Ser 08/05/2011,  10:31 AM [phone]

## 2011-08-05 NOTE — Progress Notes (Signed)
Physical Therapy Treatment Patient Details Name: Judith Hardy MRN: [id number] DOB: 18-Feb-1970 Today's Date: 08/05/2011 Time: 4098-1191 PT Time Calculation (min): 20 min  PT Assessment / Plan / Recommendation Comments on Treatment Session  Increased time for all movement. Progressing with gait distance. Will need to ascend a flight of stairs to get to bedroom.     Follow Up Recommendations  Home health PT;Supervision/Assistance - 24 hour    Barriers to Discharge        Equipment Recommendations  Rolling walker with 5" wheels    Recommendations for Other Services OT consult  Frequency Min 5X/week   Plan Discharge plan remains appropriate;Frequency remains appropriate    Precautions / Restrictions Precautions Precautions: Back Precaution Comments: Reviewed back precautions and logroll technique Required Braces or Orthoses: Spinal Brace Spinal Brace: Lumbar corset Restrictions Weight Bearing Restrictions: No   Pertinent Vitals/Pain *7/10 back, pt premedicated**    Mobility  Transfers Transfers: Sit to Stand;Stand to Sit Sit to Stand: With upper extremity assist;From chair/3-in-1;With armrests;5: Supervision Sit to Stand: Patient Percentage: 90% Stand to Sit: To chair/3-in-1;With upper extremity assist;With armrests;5: Supervision Details for Transfer Assistance: VCs hand placement. Increased time.  Ambulation/Gait Ambulation/Gait Assistance: 4: Min guard Ambulation Distance (Feet): 120 Feet Assistive device: Rolling walker Gait Pattern: Step-through pattern Gait velocity: slow General Gait Details: no LOB    Exercises     PT Diagnosis:    PT Problem List:   PT Treatment Interventions:     PT Goals Acute Rehab PT Goals PT Goal Formulation: With patient/family Time For Goal Achievement: 08/10/11 Potential to Achieve Goals: Good Pt will Roll Supine to Left Side: with modified independence Pt will go Supine/Side to Sit: with modified independence Pt will go Sit to  Supine/Side: with modified independence Pt will go Sit to Stand: with supervision PT Goal: Sit to Stand - Progress: Met Pt will Ambulate: 51 - 150 feet;with supervision;with rolling walker PT Goal: Ambulate - Progress: Progressing toward goal Pt will Go Up / Down Stairs: 6-9 stairs;with min assist;with rail(s) (7 steps)  Visit Information  Last PT Received On: 08/05/11 Assistance Needed: +1    Subjective Data  Subjective: This feels good (back brace). My right foot is numb. Patient Stated Goal: decrease pain   Cognition  Overall Cognitive Status: Appears within functional limits for tasks assessed/performed Arousal/Alertness: Awake/alert Orientation Level: Appears intact for tasks assessed Behavior During Session: [facility] for tasks performed    Balance     End of Session PT - End of Session Equipment Utilized During Treatment: Back brace Activity Tolerance: Patient limited by pain;Treatment limited secondary to medical complications (Comment) (pt became dizzy with walking) Patient left: in chair;with call bell/phone within reach;with family/visitor present   GP     Tamala Ser 08/05/2011, 3:49 PM [phone]

## 2011-08-06 MED ORDER — HYDROMORPHONE HCL 8 MG PO TABS: 4.0000 mg | ORAL_TABLET | Freq: Four times a day (QID) | ORAL | Status: AC | PRN

## 2011-08-06 MED ORDER — DSS 100 MG PO CAPS: 100.0000 mg | ORAL_CAPSULE | Freq: Two times a day (BID) | ORAL | Status: AC

## 2011-08-06 MED ORDER — METHOCARBAMOL 500 MG PO TABS: 500.0000 mg | ORAL_TABLET | Freq: Three times a day (TID) | ORAL | Status: AC

## 2011-08-06 MED ORDER — OXYMORPHONE HCL ER 40 MG PO TB12: 40.0000 mg | ORAL_TABLET | Freq: Two times a day (BID) | ORAL | Status: DC

## 2011-08-06 NOTE — Progress Notes (Signed)
While attempting to speak with pt about discharge plans she was very lethargic and could not hold her eyes open and her speech was very slurred. This did not start until after giving her requested prn Dilaudid 8mg  PO. When confronting her about the med making her lethargic the pt became defensive. VSS, will continue to monitor.

## 2011-08-06 NOTE — Progress Notes (Signed)
Subjective: 4 Days Post-Op Procedure(s) (LRB): LUMBAR LAMINECTOMY/DECOMPRESSION MICRODISCECTOMY (N/A) Patient reports pain as 4 on 0-10 scale.    Objective: Vital signs in last 24 hours: Temp:  [98.2 F (36.8 C)-99.6 F (37.6 C)] 98.2 F (36.8 C) (07/29 0520) Pulse Rate:  [84-93] 84  (07/29 0520) Resp:  [16-18] 16  (07/29 0520) BP: (99-107)/(63-72) 99/63 mmHg (07/29 0520) SpO2:  [96 %-100 %] 97 % (07/29 0520)  Intake/Output from previous day: 07/28 0701 - 07/29 0700 In: 1320 [P.O.:1320] Out: 1850 [Urine:1850] Intake/Output this shift:    No results found for this basename: HGB:5 in the last 72 hours No results found for this basename: WBC:2,RBC:2,HCT:2,PLT:2 in the last 72 hours No results found for this basename: NA:2,K:2,CL:2,CO2:2,BUN:2,CREATININE:2,GLUCOSE:2,CALCIUM:2 in the last 72 hours No results found for this basename: LABPT:2,INR:2 in the last 72 hours  ABD soft Neurovascular intact Sensation intact distally Intact pulses distally Dorsiflexion/Plantar flexion intact Incision: dressing C/D/I  Assessment/Plan: 4 Days Post-Op Procedure(s) (LRB): LUMBAR LAMINECTOMY/DECOMPRESSION MICRODISCECTOMY (N/A) Advance diet Up with therapy Discharge home with home health. D/C instructions given. Wants hospital bed.  Judith Hardy C 08/06/2011, 7:08 AM

## 2011-08-06 NOTE — Progress Notes (Addendum)
Discharge instructions were reviewed with the pt and her friend/ support person with teach back method used. All questions and concerns addressed, pt still c/o pain but has maxed out on all pain prn meds & options. Skin intact, DSG c/d/i to lower back with steri strips underneath. Pt does not appear to be in any distress at this time. Her ride is here and she will be wheeled down by staff. All belongings sent with pt.

## 2011-08-06 NOTE — Discharge Summary (Signed)
Physician Discharge Summary   Patient ID: Judith Hardy MRN: [id number] DOB/AGE: 1970/10/14 41 y.o.  Admit date: 08/02/2011 Discharge date: 08/06/2011  Primary Diagnosis:   herniated disc Lumbar 4 - Lumbar 5   Admission Diagnoses:  Past Medical History  Diagnosis Date  . Thyroid enlarged     PT STATES TOLD HER THYROID ENLARGED--BUT NO TX.  PT TAKES OTC THYROID MEDICATION  . Anxiety   . Depression   . Arthritis   . Osteoarthritis    Discharge Diagnoses:   Principal Problem:  *HNP (herniated nucleus pulposus), lumbar  Procedure:  Procedure(s) (LRB): LUMBAR LAMINECTOMY/DECOMPRESSION MICRODISCECTOMY (N/A) Redo at L45  Consults: None  HPI:  Recurrent disc herniation L45. Pain management challenge.    Laboratory Data: Hospital Outpatient Visit on 07/25/2011  Component Date Value Range Status  . Sodium 07/25/2011 134* 135 - 145 mEq/L Final  . Potassium 07/25/2011 4.6  3.5 - 5.1 mEq/L Final  . Chloride 07/25/2011 96  96 - 112 mEq/L Final  . CO2 07/25/2011 28  19 - 32 mEq/L Final  . Glucose, Bld 07/25/2011 81  70 - 99 mg/dL Final  . BUN 40/98/1191 9  6 - 23 mg/dL Final  . Creatinine, Ser 07/25/2011 0.76  0.50 - 1.10 mg/dL Final  . Calcium 47/82/9562 10.2  8.4 - 10.5 mg/dL Final  . GFR calc non Af Amer 07/25/2011 >90  >90 mL/min Final  . GFR calc Af Amer 07/25/2011 >90  >90 mL/min Final   Comment:                                 The eGFR has been calculated                          using the CKD EPI equation.                          This calculation has not been                          validated in all clinical                          situations.                          eGFR's persistently                          <90 mL/min signify                          possible Chronic Kidney Disease.  . WBC 07/25/2011 13.0* 4.0 - 10.5 K/uL Final  . RBC 07/25/2011 4.14  3.87 - 5.11 MIL/uL Final  . Hemoglobin 07/25/2011 13.6  12.0 - 15.0 g/dL Final  . HCT 13/08/6576 40.5  36.0  - 46.0 % Final  . MCV 07/25/2011 97.8  78.0 - 100.0 fL Final  . MCH 07/25/2011 32.9  26.0 - 34.0 pg Final  . MCHC 07/25/2011 33.6  30.0 - 36.0 g/dL Final  . RDW 46/96/2952 12.8  11.5 - 15.5 % Final  . Platelets 07/25/2011 349  150 - 400 K/uL Final  . Prothrombin Time 07/25/2011 12.7  11.6 - 15.2  seconds Final  . INR 07/25/2011 0.93  0.00 - 1.49 Final  . Color, Urine 07/25/2011 YELLOW  YELLOW Final  . APPearance 07/25/2011 CLEAR  CLEAR Final  . Specific Gravity, Urine 07/25/2011 1.015  1.005 - 1.030 Final  . pH 07/25/2011 6.0  5.0 - 8.0 Final  . Glucose, UA 07/25/2011 NEGATIVE  NEGATIVE mg/dL Final  . Hgb urine dipstick 07/25/2011 NEGATIVE  NEGATIVE Final  . Bilirubin Urine 07/25/2011 NEGATIVE  NEGATIVE Final  . Ketones, ur 07/25/2011 NEGATIVE  NEGATIVE mg/dL Final  . Protein, ur 16/10/9602 NEGATIVE  NEGATIVE mg/dL Final  . Urobilinogen, UA 07/25/2011 0.2  0.0 - 1.0 mg/dL Final  . Nitrite 54/09/8117 NEGATIVE  NEGATIVE Final  . Leukocytes, UA 07/25/2011 NEGATIVE  NEGATIVE Final   MICROSCOPIC NOT DONE ON URINES WITH NEGATIVE PROTEIN, BLOOD, LEUKOCYTES, NITRITE, OR GLUCOSE <1000 mg/dL.  Marland Kitchen MRSA, PCR 07/25/2011 NEGATIVE  NEGATIVE Final  . Staphylococcus aureus 07/25/2011 NEGATIVE  NEGATIVE Final   Comment:                                 The Xpert SA Assay (FDA                          approved for NASAL specimens                          only), is one component of                          a comprehensive surveillance                          program.  It is not intended                          to diagnose infection nor to                          guide or monitor treatment.  . Preg, Serum 07/25/2011 NEGATIVE  NEGATIVE Final   No results found for this basename: HGB:5 in the last 72 hours No results found for this basename: WBC:2,RBC:2,HCT:2,PLT:2 in the last 72 hours No results found for this basename: NA:2,K:2,CL:2,CO2:2,BUN:2,CREATININE:2,GLUCOSE:2,CALCIUM:2 in the last 72 hours No  results found for this basename: LABPT:2,INR:2 in the last 72 hours  X-Rays:Dg Pelvis 1-2 Views  08/03/2011  *RADIOLOGY REPORT*  Clinical Data: Post lumbar spine surgery yesterday, now with postoperative abdominal and back pain.  PELVIS - 1-2 VIEW  Comparison: Intraoperative lumbar spine localization radiographs - 08/02/2011 (multiple examinations)  Findings:  An IUD overlies the pelvis.  No fracture or dislocation.  Moderate gaseous distension of the sigmoid colon.  Regional soft tissues are normal.  IMPRESSION: Unremarkable pelvic radiograph.  Original Report Authenticated By: Waynard Reeds, M.D.   Dg Abd 1 View  08/03/2011  *RADIOLOGY REPORT*  Clinical Data: Post lumbar spine surgery, evaluate for ileus  ABDOMEN - 1 VIEW  Comparison: Lumbar spine radiographs - 07/06/2011  Findings:  There is mild to moderate gaseous distension of several loops of colon and small bowel.  No supine evidence of pneumoperitoneum.  No definite pneumatosis or portal venous gas.  No definite abnormal intra-abdominal calcifications.  Limited visualization of the  lower thorax demonstrates mild elevation of the right hemidiaphragm.  An IUD overlies the pelvis.  No acute osseous abnormalities.  IMPRESSION: Overall findings most suggestive of mild to moderate postoperative ileus.  Original Report Authenticated By: Waynard Reeds, M.D.   Dg Spine Portable 1 View  08/02/2011  **ADDENDUM** CREATED: 08/02/2011 16:20:08  The film was initially mislabeled.  The localization is at L4-5.  These results were called by telephone on 08/02/2011 at 04:20 p.m. to Dr. Darrelyn Hillock, who verbally acknowledged these results.  **END ADDENDUM** SIGNED BY: Chauncey Fischer, M.D.   08/02/2011  **ADDENDUM** CREATED: 08/02/2011 16:16:22  The numbering from initial report is not consistent with the other films.  The localized space is L4-5.  **END ADDENDUM** SIGNED BY: Chauncey Fischer, M.D.   08/02/2011  *RADIOLOGY REPORT*  Clinical Data: Lumbar  spine surgery.  PORTABLE SPINE - 1 VIEW  Comparison: Intraoperative lumbar spine films from the same date.  Findings: Intraoperative lateral view the lumbar spine demonstrates a surgical probe directed at the L3-4 disc level.  The vertebral levels are numbered.  IMPRESSION: Intraoperative localization of L3-4.  Original Report Authenticated By: Jamesetta Orleans. MATTERN, M.D.   Dg Spine Portable 1 View  08/02/2011  *RADIOLOGY REPORT*  Clinical Data: Back pain  PORTABLE SPINE - 1 VIEW  Comparison: Multiple priors  Findings: Intraoperative lateral radiograph #3 demonstrates an upper slightly angled probe directed most closely toward L4-L5.  A lower slightly angled probe was directed most closely toward L5-S1.  IMPRESSION: As above.  Original Report Authenticated By: Elsie Stain, M.D.   Dg Spine Portable 1 View  08/02/2011  *RADIOLOGY REPORT*  Clinical Data: Back pain  PORTABLE SPINE - 1 VIEW  Comparison: Multiple priors.  Findings: Portable film #2 demonstrates a probe directed most closely toward the L5 pedicle.  IMPRESSION: As above.  Original Report Authenticated By: Elsie Stain, M.D.   Dg Spine Portable 1 View  08/02/2011  *RADIOLOGY REPORT*  Clinical Data: Intraoperative localization.  PORTABLE SPINE - 1 VIEW  Comparison: 06/21/2011.  07/06/2011.  Findings: Soft tissue retractors are present dorsal to the L4 vertebra.  Probe directly overlies the superior aspect of the L4 spinous process, likely in the L3-L4 interspinous region.  IMPRESSION: Intraoperative localization as described above.  Original Report Authenticated By: Andreas Newport, M.D.    EKG: Orders placed during the hospital encounter of 01/08/11  . EKG 12-LEAD  . EKG 12-LEAD     Hospital Course: Patient was admitted to [facility] and taken to the OR and underwent the above state procedure without complications.  Patient tolerated the procedure well and was later transferred to the recovery room and then to the orthopaedic  floor for postoperative care.  They were given PO and IV analgesics for pain control following their surgery.  They were given 24 hours of postoperative antibiotics.   PT was consulted postop to assist with mobility and transfers.  The patient was allowed to be WBAT with therapy and was taught back precautions. Discharge planning was consulted to help with postop disposition and equipment needs.  Patient had apainful night on the evening of surgery and started to get up OOB with therapy on day one. Patient was seen in rounds and was ready to go home on day one.  They were given discharge instructions and dressing directions.  They were instructed on when to follow up in the office with Dr. Shelle Iron. Difficult pain management challenge. On outpatient pain meds extensive. Difficult to  mobilize.  Discharge Medications: Prior to Admission medications   Medication Sig Start Date End Date Taking? Authorizing Provider  citalopram (CELEXA) 20 MG tablet Take 20 mg by mouth daily before breakfast.    Yes Historical Provider, MD  diazepam (VALIUM) 5 MG tablet Take 5 mg by mouth every 8 (eight) hours as needed. Anxiety   Yes Historical Provider, MD  Gabapentin, PHN, (GRALISE) 600 MG TABS Take 1,800 mg by mouth at bedtime.    Yes Historical Provider, MD  oxymetazoline (AFRIN) 0.05 % nasal spray Place 2 sprays into the nose 2 (two) times daily.   Yes Historical Provider, MD  oxymorphone (OPANA ER) 40 MG 12 hr tablet Take 40 mg by mouth every 12 (twelve) hours.   Yes Historical Provider, MD  Probiotic Product (PEARLS IC) CAPS Take 1 capsule by mouth daily.   Yes Historical Provider, MD  docusate sodium 100 MG CAPS Take 100 mg by mouth 2 (two) times daily. 08/06/11 08/16/11  Javier Docker, MD  Chilton Si Tea, Camillia sinensis, (GREEN TEA PO) Take 1 tablet by mouth 2 (two) times daily.     Historical Provider, MD  HYDROmorphone (DILAUDID) 8 MG tablet Take 0.5 tablets (4 mg total) by mouth every 6 (six) hours as needed. One to  Two tablets every 6 hours as needed. 08/06/11 08/16/11  Javier Docker, MD  L-LYSINE PO Take 3 tablets by mouth daily.     Historical Provider, MD  methocarbamol (ROBAXIN) 500 MG tablet Take 1 tablet (500 mg total) by mouth 3 (three) times daily. 08/06/11 08/16/11  Javier Docker, MD  Multiple Vitamin (MULITIVITAMIN WITH MINERALS) TABS Take 1 tablet by mouth 2 (two) times daily. 2 tabs in am and one tab in pm    Historical Provider, MD  Omega-3 Fatty Acids (FISH OIL) 1000 MG CAPS Take 1 capsule by mouth 2 (two) times daily.     Historical Provider, MD  OVER THE COUNTER MEDICATION Take 1 tablet by mouth 2 (two) times daily. Thyroid complex    Historical Provider, MD  OVER THE COUNTER MEDICATION SUPER CALCIUM 1200 MG WITH 800 IU VITAMIN D   TAKES 2 AT BEDTIME    Historical Provider, MD  OVER THE COUNTER MEDICATION MAGNESIUM CAPS  400 MG  -PATIENT TAKES 2 AT BEDTIME    Historical Provider, MD  oxymorphone (OPANA ER) 40 MG 12 hr tablet Take 1 tablet (40 mg total) by mouth every 12 (twelve) hours. 08/06/11   Javier Docker, MD  vitamin C (ASCORBIC ACID) 500 MG tablet Take 1,000 mg by mouth daily.     Historical Provider, MD    Diet: Regular diet Activity:WBAT Follow-up:in 10 days Disposition - Home with home health hospital bed. Discharged Condition: stable Follow up with pain management MD.    Medication List  As of 08/06/2011  7:23 AM   STOP taking these medications         cyclobenzaprine 10 MG tablet      predniSONE 10 MG tablet         TAKE these medications         citalopram 20 MG tablet   Commonly known as: CELEXA   Take 20 mg by mouth daily before breakfast.      diazepam 5 MG tablet   Commonly known as: VALIUM   Take 5 mg by mouth every 8 (eight) hours as needed. Anxiety      DSS 100 MG Caps   Take 100 mg by mouth 2 (  two) times daily.      Fish Oil 1000 MG Caps   Take 1 capsule by mouth 2 (two) times daily.      GRALISE 600 MG Tabs   Generic drug: Gabapentin (PHN)    Take 1,800 mg by mouth at bedtime.      GREEN TEA PO   Take 1 tablet by mouth 2 (two) times daily.      HYDROmorphone 8 MG tablet   Commonly known as: DILAUDID   Take 0.5 tablets (4 mg total) by mouth every 6 (six) hours as needed. One to Two tablets every 6 hours as needed.      L-LYSINE PO   Take 3 tablets by mouth daily.      methocarbamol 500 MG tablet   Commonly known as: ROBAXIN   Take 1 tablet (500 mg total) by mouth 3 (three) times daily.      multivitamin with minerals Tabs   Take 1 tablet by mouth 2 (two) times daily. 2 tabs in am and one tab in pm      OVER THE COUNTER MEDICATION   SUPER CALCIUM 1200 MG WITH 800 IU VITAMIN D   TAKES 2 AT BEDTIME      OVER THE COUNTER MEDICATION   MAGNESIUM CAPS  400 MG  -PATIENT TAKES 2 AT BEDTIME      OVER THE COUNTER MEDICATION   Take 1 tablet by mouth 2 (two) times daily. Thyroid complex      oxymetazoline 0.05 % nasal spray   Commonly known as: AFRIN   Place 2 sprays into the nose 2 (two) times daily.      oxymorphone 40 MG 12 hr tablet   Commonly known as: OPANA ER   Take 1 tablet (40 mg total) by mouth every 12 (twelve) hours.      oxymorphone 40 MG 12 hr tablet   Commonly known as: OPANA ER   Take 40 mg by mouth every 12 (twelve) hours.      PEARLS IC Caps   Take 1 capsule by mouth daily.      vitamin C 500 MG tablet   Commonly known as: ASCORBIC ACID   Take 1,000 mg by mouth daily.           Follow-up Information    Follow up with Nevin Kozuch C, MD in 10 days.   Contact information:   [facility] [address] [place] [address] [phone]          Signed: Javier Docker 08/06/2011, 7:23 AM

## 2011-10-08 ENCOUNTER — Encounter (HOSPITAL_COMMUNITY): Payer: Self-pay | Admitting: Emergency Medicine

## 2011-10-08 ENCOUNTER — Emergency Department (HOSPITAL_COMMUNITY)
Admission: EM | Admit: 2011-10-08 | Discharge: 2011-10-08 | Disposition: A | Discharge status: Home / Self Care | Payer: PRIVATE HEALTH INSURANCE | Attending: Emergency Medicine | Admitting: Emergency Medicine

## 2011-10-08 DIAGNOSIS — F3289 Other specified depressive episodes: Secondary | ICD-10-CM | POA: Insufficient documentation

## 2011-10-08 DIAGNOSIS — IMO0002 Reserved for concepts with insufficient information to code with codable children: Secondary | ICD-10-CM | POA: Insufficient documentation

## 2011-10-08 DIAGNOSIS — F411 Generalized anxiety disorder: Secondary | ICD-10-CM | POA: Insufficient documentation

## 2011-10-08 DIAGNOSIS — Z885 Allergy status to narcotic agent status: Secondary | ICD-10-CM | POA: Insufficient documentation

## 2011-10-08 DIAGNOSIS — M5416 Radiculopathy, lumbar region: Secondary | ICD-10-CM

## 2011-10-08 DIAGNOSIS — F329 Major depressive disorder, single episode, unspecified: Secondary | ICD-10-CM | POA: Insufficient documentation

## 2011-10-08 MED ORDER — HYDROMORPHONE HCL PF 1 MG/ML IJ SOLN
1.0000 mg | Freq: Once | INTRAMUSCULAR | Status: AC
  Administered 2011-10-08: 1 mg via INTRAVENOUS
  Filled 2011-10-08: qty 1

## 2011-10-08 MED ORDER — HYDROMORPHONE HCL 4 MG PO TABS: 4.0000 mg | ORAL_TABLET | ORAL | Status: DC | PRN

## 2011-10-08 MED ORDER — HYDROMORPHONE HCL PF 2 MG/ML IJ SOLN
2.0000 mg | Freq: Once | INTRAMUSCULAR | Status: AC
  Administered 2011-10-08: 2 mg via INTRAVENOUS
  Filled 2011-10-08: qty 1

## 2011-10-08 NOTE — ED Notes (Signed)
Pt states that she had back surgery August 02, 2011. States that on Thursday she felt something "twitch" and is now experiencing lower back pain.

## 2011-10-08 NOTE — ED Provider Notes (Addendum)
History     CSN: [id number]  Arrival date & time 10/08/11  1017   First MD Initiated Contact with Patient 10/08/11 1036      Chief Complaint  Patient presents with  . Back Pain    (Consider location/radiation/quality/duration/timing/severity/associated sxs/prior treatment) HPI Complaint of low back pain radiating to both feet since lumbar surgery, microdiscectomy July 2013. Pain became worse 4 days ago after she reached for her phone on countertop. She's been treated herself with a pan and with a muscle relaxer without adequate pain relief pain is worse with movement described as "deep in my back" no fever no incontinence. She has been ambulatory at home Past Medical History  Diagnosis Date  . Anxiety   . Depression   . Arthritis   . Osteoarthritis   . Thyroid enlarged     PT STATES TOLD HER THYROID ENLARGED--BUT NO TX.  PT TAKES OTC THYROID MEDICATION    Past Surgical History  Procedure Date  . Appendectomy 2011  . Lumbar laminectomy/decompression microdiscectomy 01/10/2011    Procedure: LUMBAR LAMINECTOMY/DECOMPRESSION MICRODISCECTOMY;  Surgeon: Javier Docker;  Location: [facility];  Service: Orthopedics;  Laterality: N/A;  Decompression L4 - L5  (X-Ray)  . Lumbar laminectomy/decompression microdiscectomy 08/02/2011    Procedure: LUMBAR LAMINECTOMY/DECOMPRESSION MICRODISCECTOMY;  Surgeon: Javier Docker, MD;  Location: [facility];  Service: Orthopedics;  Laterality: N/A;  Re-do Decompression L4-L5    History reviewed. No pertinent family history.  History  Substance Use Topics  . Smoking status: Never Smoker   . Smokeless tobacco: Never Used  . Alcohol Use: No    OB History    Grav Para Term Preterm Abortions TAB SAB Ect Mult Living                  Review of Systems  Constitutional: Negative.   HENT: Negative.   Respiratory: Negative.   Cardiovascular: Negative.   Gastrointestinal: Negative.   Musculoskeletal: Positive for back pain.  Skin: Negative.     Neurological: Negative.   Hematological: Negative.   Psychiatric/Behavioral: Negative.   All other systems reviewed and are negative.    Allergies  Hydrocodone  Home Medications   Current Outpatient Rx  Name Route Sig Dispense Refill  . CITALOPRAM HYDROBROMIDE 20 MG PO TABS Oral Take 20 mg by mouth daily before breakfast.     . CYCLOBENZAPRINE HCL 10 MG PO TABS Oral Take 10 mg by mouth 3 (three) times daily as needed.    Marland Kitchen GABAPENTIN (PHN) 600 MG PO TABS Oral Take 1,800 mg by mouth at bedtime.     . ADULT MULTIVITAMIN W/MINERALS CH Oral Take 1 tablet by mouth daily. 2 tabs in am and one tab in pm    . FISH OIL 1000 MG PO CAPS Oral Take 1 capsule by mouth 2 (two) times daily.     Marland Kitchen OXYMORPHONE HCL ER 40 MG PO TB12 Oral Take 40 mg by mouth every 12 (twelve) hours.    Marland Kitchen PEARLS IC PO CAPS Oral Take 1 capsule by mouth daily.    Marland Kitchen DIAZEPAM 5 MG PO TABS Oral Take 5 mg by mouth every 8 (eight) hours as needed. Anxiety      BP 103/75  Pulse 70  Temp 97.7 F (36.5 C) (Oral)  Resp 20  SpO2 100%  Physical Exam  Nursing note and vitals reviewed. Constitutional: She is oriented to person, place, and time. She appears well-developed and well-nourished. She appears distressed.       Appears  uncomfortable  HENT:  Head: Normocephalic and atraumatic.  Eyes: Conjunctivae normal are normal. Pupils are equal, round, and reactive to light.  Neck: Neck supple. No tracheal deviation present. No thyromegaly present.  Cardiovascular: Normal rate and regular rhythm.   No murmur heard. Pulmonary/Chest: Effort normal and breath sounds normal.  Abdominal: Soft. Bowel sounds are normal. She exhibits no distension. There is no tenderness.  Musculoskeletal: Normal range of motion. She exhibits no edema and no tenderness.       Well-healed surgical scar over lumbar area with corresponding tenderness. Pain at lumbar area upon changing positions in bed  Neurological: She is alert and oriented to person,  place, and time. She has normal reflexes. She exhibits normal muscle tone. Coordination normal.  Skin: Skin is warm and dry. No rash noted.  Psychiatric: She has a normal mood and affect.    ED Course  Procedures (including critical care time)  Labs Reviewed - No data to display No results found. 12:45 PM patient feels much improved after treatment with intravenous hydromorphone. She feels her low back pain is at baseline. She ambulates without difficulty    No diagnosis found.    MDM  Plan prescription hydromorphone for breakthrough pain 12:55 PM spoke withBeane Followup with Dr.Beane as outpatient Diagnosis chronic lumbar radiculopathy         Doug Sou, MD 10/08/11 1255  Doug Sou, MD 10/08/11 1259

## 2011-11-25 ENCOUNTER — Emergency Department (HOSPITAL_COMMUNITY): Payer: Self-pay

## 2011-11-25 ENCOUNTER — Encounter (HOSPITAL_COMMUNITY): Payer: Self-pay | Admitting: *Deleted

## 2011-11-25 ENCOUNTER — Emergency Department (HOSPITAL_COMMUNITY)
Admission: EM | Admit: 2011-11-25 | Discharge: 2011-11-25 | Disposition: A | Discharge status: Home / Self Care | Payer: Self-pay | Attending: Emergency Medicine | Admitting: Emergency Medicine

## 2011-11-25 DIAGNOSIS — Z79899 Other long term (current) drug therapy: Secondary | ICD-10-CM | POA: Insufficient documentation

## 2011-11-25 DIAGNOSIS — M545 Low back pain, unspecified: Secondary | ICD-10-CM | POA: Insufficient documentation

## 2011-11-25 DIAGNOSIS — M1990 Unspecified osteoarthritis, unspecified site: Secondary | ICD-10-CM | POA: Insufficient documentation

## 2011-11-25 DIAGNOSIS — F411 Generalized anxiety disorder: Secondary | ICD-10-CM | POA: Insufficient documentation

## 2011-11-25 DIAGNOSIS — F329 Major depressive disorder, single episode, unspecified: Secondary | ICD-10-CM | POA: Insufficient documentation

## 2011-11-25 DIAGNOSIS — G8929 Other chronic pain: Secondary | ICD-10-CM

## 2011-11-25 DIAGNOSIS — F3289 Other specified depressive episodes: Secondary | ICD-10-CM | POA: Insufficient documentation

## 2011-11-25 DIAGNOSIS — E079 Disorder of thyroid, unspecified: Secondary | ICD-10-CM | POA: Insufficient documentation

## 2011-11-25 MED ORDER — HYDROMORPHONE HCL PF 2 MG/ML IJ SOLN
2.0000 mg | Freq: Once | INTRAMUSCULAR | Status: AC
  Administered 2011-11-25: 2 mg via INTRAMUSCULAR
  Filled 2011-11-25: qty 1

## 2011-11-25 MED ORDER — HYDROMORPHONE HCL PF 1 MG/ML IJ SOLN
1.0000 mg | Freq: Once | INTRAMUSCULAR | Status: AC
  Administered 2011-11-25: 1 mg via INTRAMUSCULAR
  Filled 2011-11-25: qty 1

## 2011-11-25 MED ORDER — ONDANSETRON 8 MG PO TBDP
8.0000 mg | ORAL_TABLET | Freq: Once | ORAL | Status: AC
  Administered 2011-11-25: 8 mg via ORAL
  Filled 2011-11-25: qty 1

## 2011-11-25 NOTE — ED Notes (Signed)
Pt c/o lower back pain, states had surgery July 25th, reports chronic pain since, but on yesterday fell and pain has worse since then.
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
## 2011-12-01 ENCOUNTER — Encounter (HOSPITAL_COMMUNITY): Payer: Self-pay | Admitting: Emergency Medicine

## 2011-12-01 ENCOUNTER — Emergency Department (HOSPITAL_COMMUNITY): Payer: PRIVATE HEALTH INSURANCE

## 2011-12-01 ENCOUNTER — Emergency Department (HOSPITAL_COMMUNITY)
Admission: EM | Admit: 2011-12-01 | Discharge: 2011-12-01 | Disposition: A | Discharge status: Home / Self Care | Payer: PRIVATE HEALTH INSURANCE | Attending: Emergency Medicine | Admitting: Emergency Medicine

## 2011-12-01 DIAGNOSIS — Z9889 Other specified postprocedural states: Secondary | ICD-10-CM | POA: Insufficient documentation

## 2011-12-01 DIAGNOSIS — R0602 Shortness of breath: Secondary | ICD-10-CM | POA: Insufficient documentation

## 2011-12-01 DIAGNOSIS — F329 Major depressive disorder, single episode, unspecified: Secondary | ICD-10-CM | POA: Insufficient documentation

## 2011-12-01 DIAGNOSIS — F411 Generalized anxiety disorder: Secondary | ICD-10-CM | POA: Insufficient documentation

## 2011-12-01 DIAGNOSIS — R079 Chest pain, unspecified: Secondary | ICD-10-CM | POA: Insufficient documentation

## 2011-12-01 DIAGNOSIS — F3289 Other specified depressive episodes: Secondary | ICD-10-CM | POA: Insufficient documentation

## 2011-12-01 DIAGNOSIS — G8929 Other chronic pain: Secondary | ICD-10-CM | POA: Insufficient documentation

## 2011-12-01 DIAGNOSIS — F419 Anxiety disorder, unspecified: Secondary | ICD-10-CM

## 2011-12-01 DIAGNOSIS — Z79899 Other long term (current) drug therapy: Secondary | ICD-10-CM | POA: Insufficient documentation

## 2011-12-01 DIAGNOSIS — E049 Nontoxic goiter, unspecified: Secondary | ICD-10-CM | POA: Insufficient documentation

## 2011-12-01 DIAGNOSIS — Z8739 Personal history of other diseases of the musculoskeletal system and connective tissue: Secondary | ICD-10-CM | POA: Insufficient documentation

## 2011-12-01 DIAGNOSIS — M549 Dorsalgia, unspecified: Secondary | ICD-10-CM | POA: Insufficient documentation

## 2011-12-01 DIAGNOSIS — F17200 Nicotine dependence, unspecified, uncomplicated: Secondary | ICD-10-CM | POA: Insufficient documentation

## 2011-12-01 LAB — CBC WITH DIFFERENTIAL/PLATELET
Eosinophils Relative: 0 % (ref 0–5)
HCT: 41.8 % (ref 36.0–46.0)
Hemoglobin: 14.1 g/dL (ref 12.0–15.0)
Lymphocytes Relative: 29 % (ref 12–46)
Lymphs Abs: 5.6 10*3/uL — ABNORMAL HIGH (ref 0.7–4.0)
MCH: 32.1 pg (ref 26.0–34.0)
MCV: 95.2 fL (ref 78.0–100.0)
Monocytes Absolute: 0.9 10*3/uL (ref 0.1–1.0)
Monocytes Relative: 5 % (ref 3–12)
Platelets: 430 10*3/uL — ABNORMAL HIGH (ref 150–400)
RBC: 4.39 MIL/uL (ref 3.87–5.11)
WBC: 19.5 10*3/uL — ABNORMAL HIGH (ref 4.0–10.5)

## 2011-12-01 LAB — URINALYSIS, ROUTINE W REFLEX MICROSCOPIC
Bilirubin Urine: NEGATIVE
Hgb urine dipstick: NEGATIVE
Protein, ur: NEGATIVE mg/dL
Urobilinogen, UA: 0.2 mg/dL (ref 0.0–1.0)

## 2011-12-01 LAB — COMPREHENSIVE METABOLIC PANEL
ALT: 136 U/L — ABNORMAL HIGH (ref 0–35)
Alkaline Phosphatase: 112 U/L (ref 39–117)
BUN: 9 mg/dL (ref 6–23)
CO2: 24 mEq/L (ref 19–32)
Calcium: 9.2 mg/dL (ref 8.4–10.5)
GFR calc Af Amer: >90 mL/min (ref >90)
GFR calc non Af Amer: >90 mL/min (ref >90)
Glucose, Bld: 84 mg/dL (ref 70–99)
Sodium: 132 mEq/L — ABNORMAL LOW (ref 135–145)

## 2011-12-01 LAB — TROPONIN I: Troponin I: <0.3 ng/mL (ref <0.30)

## 2011-12-01 LAB — ETHANOL: Alcohol, Ethyl (B): <11 mg/dL (ref 0–11)

## 2011-12-01 LAB — RAPID URINE DRUG SCREEN, HOSP PERFORMED
Amphetamines: NOT DETECTED
Tetrahydrocannabinol: NOT DETECTED

## 2011-12-01 MED ORDER — TRAZODONE HCL 50 MG PO TABS: 50.0000 mg | ORAL_TABLET | Freq: Every day | ORAL | Status: DC

## 2011-12-01 MED ORDER — SODIUM CHLORIDE 0.9 % IV SOLN
Freq: Once | INTRAVENOUS | Status: AC
  Administered 2011-12-01: 09:00:00 via INTRAVENOUS

## 2011-12-01 MED ORDER — LORAZEPAM 2 MG/ML IJ SOLN
1.0000 mg | Freq: Once | INTRAMUSCULAR | Status: AC
  Administered 2011-12-01: 1 mg via INTRAVENOUS
  Filled 2011-12-01: qty 1

## 2011-12-01 MED ORDER — CITALOPRAM HYDROBROMIDE 20 MG PO TABS: 30.0000 mg | ORAL_TABLET | Freq: Every day | ORAL | Status: DC

## 2011-12-01 MED ORDER — HYDROMORPHONE HCL PF 1 MG/ML IJ SOLN
1.0000 mg | Freq: Once | INTRAMUSCULAR | Status: AC
  Administered 2011-12-01: 1 mg via INTRAVENOUS
  Filled 2011-12-01: qty 1

## 2011-12-01 NOTE — ED Notes (Signed)
Pt had short episode of chest pain yesterday which resolved on its own. Woke this morning w/ severe mid sternal chest pain, pt is anxious and crying, diaphoretic and obviously uncomfortable.

## 2011-12-01 NOTE — ED Notes (Signed)
Pt in xray

## 2011-12-01 NOTE — ED Notes (Signed)
Pt working on transportation home.

## 2011-12-01 NOTE — ED Notes (Signed)
rn called telepsych to check status, was told pts telepsych would not be conducted for several hours

## 2011-12-01 NOTE — ED Notes (Signed)
Gave patient some crackers and cheese

## 2011-12-01 NOTE — ED Notes (Signed)
Pt escorted to discharge window. Pt verbalized understanding discharge instructions. In no acute distress.  

## 2011-12-01 NOTE — ED Notes (Addendum)
Pt alert and oriented x4. Respirations even and unlabored, bilateral symmetrical rise and fall of chest. Skin warm and dry. In no acute distress. Denies needs.  Per md pt allowed to have food

## 2011-12-01 NOTE — ED Provider Notes (Signed)
History     CSN: [id number]  Arrival date & time 12/01/11  4098   First MD Initiated Contact with Patient 12/01/11 0845      Chief Complaint  Patient presents with  . Chest Pain    (Consider location/radiation/quality/duration/timing/severity/associated sxs/prior treatment) The history is provided by the patient.  Judith Hardy is a 41 y.o. female hx of anxiety, depression, lumbar surgery here with chest pain. Felt overwhelmed recently and depressed. Yesterday, she had an episode of substernal chest pain that resolved. She felt a sense of tightness across her chest without any radiation. No fever or chills or cough. The pain is intermittent. She also had worsening of her chronic back pain but denies numbness or tingling or weakness of her legs or incontinence. Denies suicidal or homicidal ideations but is very guarded. CAD risk factors including smoker. No recent travel or DVT/ PE or leg swelling.    Past Medical History  Diagnosis Date  . Anxiety   . Depression   . Arthritis   . Osteoarthritis   . Thyroid enlarged     PT STATES TOLD HER THYROID ENLARGED--BUT NO TX.  PT TAKES OTC THYROID MEDICATION    Past Surgical History  Procedure Date  . Appendectomy 2011  . Lumbar laminectomy/decompression microdiscectomy 01/10/2011    Procedure: LUMBAR LAMINECTOMY/DECOMPRESSION MICRODISCECTOMY;  Surgeon: Javier Docker;  Location: [facility];  Service: Orthopedics;  Laterality: N/A;  Decompression L4 - L5  (X-Ray)  . Lumbar laminectomy/decompression microdiscectomy 08/02/2011    Procedure: LUMBAR LAMINECTOMY/DECOMPRESSION MICRODISCECTOMY;  Surgeon: Javier Docker, MD;  Location: [facility];  Service: Orthopedics;  Laterality: N/A;  Re-do Decompression L4-L5    History reviewed. No pertinent family history.  History  Substance Use Topics  . Smoking status: Current Some Day Smoker  . Smokeless tobacco: Never Used  . Alcohol Use: No    OB History    Grav Para Term Preterm Abortions TAB SAB  Ect Mult Living                  Review of Systems  Respiratory: Positive for shortness of breath.   Cardiovascular: Positive for chest pain.  All other systems reviewed and are negative.    Allergies  Hydrocodone  Home Medications   Current Outpatient Rx  Name  Route  Sig  Dispense  Refill  . CITALOPRAM HYDROBROMIDE 20 MG PO TABS   Oral   Take 20 mg by mouth daily before breakfast.          . CLONAZEPAM 1 MG PO TABS   Oral   Take 1 mg by mouth at bedtime as needed. For sleep.         Marland Kitchen GABAPENTIN (PHN) 600 MG PO TABS   Oral   Take 1,800 mg by mouth at bedtime.          Marland Kitchen HYDROMORPHONE HCL 8 MG PO TABS   Oral   Take 8 mg by mouth every 4 (four) hours as needed. For pain.         Marland Kitchen OXYMORPHONE HCL ER 40 MG PO TB12   Oral   Take 40 mg by mouth every 12 (twelve) hours.         . OXYMORPHONE HCL 10 MG PO TABS   Oral   Take 10 mg by mouth every 8 (eight) hours as needed. pain         . VITAMIN C 500 MG PO TABS   Oral   Take 500 mg by  mouth daily.           BP 106/69  Pulse 70  Temp 98.6 F (37 C) (Oral)  Resp 17  SpO2 98%  Physical Exam  Nursing note and vitals reviewed. Constitutional: She is oriented to person, place, and time.       Anxious, crying.   HENT:  Head: Normocephalic.  Mouth/Throat: Oropharynx is clear and moist.  Eyes: Conjunctivae normal are normal. Pupils are equal, round, and reactive to light.  Neck: Normal range of motion. Neck supple.  Cardiovascular: Normal rate, regular rhythm and normal heart sounds.   Pulmonary/Chest: Effort normal and breath sounds normal. No respiratory distress. She has no wheezes. She has no rales.  Abdominal: Soft. Bowel sounds are normal. She exhibits no distension. There is no tenderness. There is no rebound.  Musculoskeletal: Normal range of motion.  Neurological: She is alert and oriented to person, place, and time.  Skin: Skin is warm and dry.  Psychiatric:       Depressed, crying.  Poor judgment.     ED Course  Procedures (including critical care time)  Labs Reviewed  CBC WITH DIFFERENTIAL - Abnormal; Notable for the following:    WBC 19.5 (*)     Platelets 430 (*)     Neutro Abs 12.9 (*)     Lymphs Abs 5.6 (*)     All other components within normal limits  COMPREHENSIVE METABOLIC PANEL - Abnormal; Notable for the following:    Sodium 132 (*)     Total Protein 8.5 (*)     AST 43 (*)     ALT 136 (*)     Total Bilirubin 0.2 (*)     All other components within normal limits  URINALYSIS, ROUTINE W REFLEX MICROSCOPIC - Abnormal; Notable for the following:    APPearance CLOUDY (*)     Specific Gravity, Urine 1.031 (*)     All other components within normal limits  URINE RAPID DRUG SCREEN (HOSP PERFORMED) - Abnormal; Notable for the following:    Opiates POSITIVE (*)     Benzodiazepines POSITIVE (*)     All other components within normal limits  TROPONIN I  PREGNANCY, URINE  ETHANOL  TROPONIN I   Dg Chest 2 View  12/01/2011  *RADIOLOGY REPORT*  Clinical Data: Chest pain and shortness of breath.  CHEST - 2 VIEW  Comparison: 01/08/2011  Findings: The lungs show no evidence of edema, infiltrate, pneumothorax or pulmonary nodule.  No pleural effusions are identified.  Heart size and mediastinal contours are within normal limits.  Bony thorax is unremarkable.  IMPRESSION: No active disease.   Original Report Authenticated By: Irish Lack, M.D.      No diagnosis found.    Date: 12/01/2011  Rate: 82  Rhythm: normal sinus rhythm  QRS Axis: normal  Intervals: normal  ST/T Wave abnormalities: normal  Conduction Disutrbances:none  Narrative Interpretation:   Old EKG Reviewed: unchanged   MDM  Judith Hardy is a 41 y.o. female here with chest pain, anxiety. Chest pain likely from anxiety and depression. EKG unchanged. Will get trop x 2. Will also get psych eval.   11:30AM Felt better, less anxious. Trop neg x 1. Labs and CXR otherwise unremarkable.  Psych called. Patient now said that she was depressed about being disabled from her chronic back pain. She also ran out of her oxycontin. She now feels better with meds. Denies suicidal or homicidal ideation. Doesn't want to wait for psych eval and, since  she is not harm to self or others, she can be d/c after second trop.   1:22 PM Troponin neg x 2. Felt better. Telepsych saw patient. Recommend increase celexa to 30mg  daily and add trazodone 50mg  QPM and continue klonopin. Stable for d/c.      Richardean Canal, MD 12/01/11 1323

## 2011-12-01 NOTE — ED Notes (Signed)
telepsych being conducted.  

## 2011-12-01 NOTE — ED Notes (Signed)
md at bedside  Pt alert and oriented x4. Respirations even and unlabored, bilateral symmetrical rise and fall of chest. Skin warm and dry. In no acute distress. Denies needs.   

## 2011-12-01 NOTE — ED Notes (Signed)
telepsych request faxed and called 

## 2011-12-19 ENCOUNTER — Emergency Department (HOSPITAL_COMMUNITY)
Admission: EM | Admit: 2011-12-19 | Discharge: 2011-12-19 | Disposition: A | Discharge status: Home / Self Care | Payer: PRIVATE HEALTH INSURANCE | Attending: Emergency Medicine | Admitting: Emergency Medicine

## 2011-12-19 ENCOUNTER — Encounter (HOSPITAL_COMMUNITY): Payer: Self-pay | Admitting: Emergency Medicine

## 2011-12-19 DIAGNOSIS — Z8639 Personal history of other endocrine, nutritional and metabolic disease: Secondary | ICD-10-CM | POA: Insufficient documentation

## 2011-12-19 DIAGNOSIS — F329 Major depressive disorder, single episode, unspecified: Secondary | ICD-10-CM

## 2011-12-19 DIAGNOSIS — Z862 Personal history of diseases of the blood and blood-forming organs and certain disorders involving the immune mechanism: Secondary | ICD-10-CM | POA: Insufficient documentation

## 2011-12-19 DIAGNOSIS — G8929 Other chronic pain: Secondary | ICD-10-CM | POA: Insufficient documentation

## 2011-12-19 DIAGNOSIS — F3289 Other specified depressive episodes: Secondary | ICD-10-CM | POA: Insufficient documentation

## 2011-12-19 DIAGNOSIS — M549 Dorsalgia, unspecified: Secondary | ICD-10-CM | POA: Insufficient documentation

## 2011-12-19 DIAGNOSIS — Z3202 Encounter for pregnancy test, result negative: Secondary | ICD-10-CM | POA: Insufficient documentation

## 2011-12-19 DIAGNOSIS — F411 Generalized anxiety disorder: Secondary | ICD-10-CM | POA: Insufficient documentation

## 2011-12-19 DIAGNOSIS — F17200 Nicotine dependence, unspecified, uncomplicated: Secondary | ICD-10-CM | POA: Insufficient documentation

## 2011-12-19 DIAGNOSIS — Z9889 Other specified postprocedural states: Secondary | ICD-10-CM | POA: Insufficient documentation

## 2011-12-19 DIAGNOSIS — Z79899 Other long term (current) drug therapy: Secondary | ICD-10-CM | POA: Insufficient documentation

## 2011-12-19 DIAGNOSIS — F32A Depression, unspecified: Secondary | ICD-10-CM

## 2011-12-19 DIAGNOSIS — M1990 Unspecified osteoarthritis, unspecified site: Secondary | ICD-10-CM | POA: Insufficient documentation

## 2011-12-19 DIAGNOSIS — R748 Abnormal levels of other serum enzymes: Secondary | ICD-10-CM | POA: Insufficient documentation

## 2011-12-19 LAB — COMPREHENSIVE METABOLIC PANEL
AST: 18 U/L (ref 0–37)
Albumin: 3.8 g/dL (ref 3.5–5.2)
BUN: 4 mg/dL — ABNORMAL LOW (ref 6–23)
Calcium: 9.6 mg/dL (ref 8.4–10.5)
Creatinine, Ser: 0.7 mg/dL (ref 0.50–1.10)
Total Protein: 8.2 g/dL (ref 6.0–8.3)

## 2011-12-19 LAB — RAPID URINE DRUG SCREEN, HOSP PERFORMED
Amphetamines: NOT DETECTED
Benzodiazepines: NOT DETECTED
Opiates: POSITIVE — AB

## 2011-12-19 LAB — CBC
MCH: 31.4 pg (ref 26.0–34.0)
MCHC: 32.9 g/dL (ref 30.0–36.0)
MCV: 95.5 fL (ref 78.0–100.0)
Platelets: 368 10*3/uL (ref 150–400)
RDW: 13.4 % (ref 11.5–15.5)
WBC: 12.5 10*3/uL — ABNORMAL HIGH (ref 4.0–10.5)

## 2011-12-19 LAB — ETHANOL: Alcohol, Ethyl (B): <11 mg/dL (ref 0–11)

## 2011-12-19 LAB — SALICYLATE LEVEL: Salicylate Lvl: <2 mg/dL — ABNORMAL LOW (ref 2.8–20.0)

## 2011-12-19 MED ORDER — GABAPENTIN 400 MG PO CAPS
1800.0000 mg | ORAL_CAPSULE | Freq: Every day | ORAL | Status: DC
  Filled 2011-12-19: qty 2

## 2011-12-19 MED ORDER — IBUPROFEN 200 MG PO TABS: 600.0000 mg | ORAL_TABLET | Freq: Three times a day (TID) | ORAL | Status: DC | PRN

## 2011-12-19 MED ORDER — HYDROMORPHONE HCL 2 MG PO TABS
8.0000 mg | ORAL_TABLET | ORAL | Status: DC | PRN
  Administered 2011-12-19: 8 mg via ORAL
  Filled 2011-12-19: qty 4

## 2011-12-19 MED ORDER — ONDANSETRON 8 MG PO TBDP
8.0000 mg | ORAL_TABLET | Freq: Once | ORAL | Status: AC
  Administered 2011-12-19: 8 mg via ORAL
  Filled 2011-12-19: qty 1

## 2011-12-19 MED ORDER — NICOTINE 21 MG/24HR TD PT24: 21.0000 mg | MEDICATED_PATCH | Freq: Every day | TRANSDERMAL | Status: DC

## 2011-12-19 MED ORDER — ACETAMINOPHEN 325 MG PO TABS: 650.0000 mg | ORAL_TABLET | ORAL | Status: DC | PRN

## 2011-12-19 MED ORDER — ALUM & MAG HYDROXIDE-SIMETH 200-200-20 MG/5ML PO SUSP: 30.0000 mL | ORAL | Status: DC | PRN

## 2011-12-19 MED ORDER — ZOLPIDEM TARTRATE 5 MG PO TABS: 5.0000 mg | ORAL_TABLET | Freq: Every evening | ORAL | Status: DC | PRN

## 2011-12-19 MED ORDER — GABAPENTIN (ONCE-DAILY) 600 MG PO TABS: 1800.0000 mg | ORAL_TABLET | Freq: Every day | ORAL | Status: DC

## 2011-12-19 MED ORDER — ONDANSETRON HCL 4 MG PO TABS: 4.0000 mg | ORAL_TABLET | Freq: Three times a day (TID) | ORAL | Status: DC | PRN

## 2011-12-19 MED ORDER — CITALOPRAM HYDROBROMIDE 20 MG PO TABS: 30.0000 mg | ORAL_TABLET | Freq: Every day | ORAL | Status: DC
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
## 2011-12-19 NOTE — ED Notes (Signed)
Report to [facility]. Pt to room 26. Per security pt has been wanded pt with a bangle bracelet that will not come off.

## 2011-12-19 NOTE — ED Notes (Signed)
Patient given discharge instructions, information, prescriptions, and diet order. Patient states that they adequately understand discharge information given and to return to ED if symptoms return or worsen.    Pt advised to follow up with [facility] tomorrow.
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
## 2011-12-20 NOTE — ED Provider Notes (Signed)
Medical screening examination/treatment/procedure(s) were performed by non-physician practitioner and as supervising physician I was immediately available for consultation/collaboration.  Jones Skene, M.D.     Jones Skene, MD 12/20/11 1449

## 2012-01-18 ENCOUNTER — Emergency Department (HOSPITAL_COMMUNITY)
Admission: EM | Admit: 2012-01-18 | Discharge: 2012-01-18 | Disposition: A | Discharge status: Home / Self Care | Payer: PRIVATE HEALTH INSURANCE | Attending: Emergency Medicine | Admitting: Emergency Medicine

## 2012-01-18 ENCOUNTER — Encounter (HOSPITAL_COMMUNITY): Payer: Self-pay | Admitting: *Deleted

## 2012-01-18 DIAGNOSIS — M62838 Other muscle spasm: Secondary | ICD-10-CM | POA: Insufficient documentation

## 2012-01-18 DIAGNOSIS — M545 Low back pain, unspecified: Secondary | ICD-10-CM | POA: Insufficient documentation

## 2012-01-18 DIAGNOSIS — Z87891 Personal history of nicotine dependence: Secondary | ICD-10-CM | POA: Insufficient documentation

## 2012-01-18 DIAGNOSIS — Z79899 Other long term (current) drug therapy: Secondary | ICD-10-CM | POA: Insufficient documentation

## 2012-01-18 DIAGNOSIS — G8929 Other chronic pain: Secondary | ICD-10-CM | POA: Insufficient documentation

## 2012-01-18 DIAGNOSIS — F3289 Other specified depressive episodes: Secondary | ICD-10-CM | POA: Insufficient documentation

## 2012-01-18 DIAGNOSIS — F411 Generalized anxiety disorder: Secondary | ICD-10-CM | POA: Insufficient documentation

## 2012-01-18 DIAGNOSIS — F329 Major depressive disorder, single episode, unspecified: Secondary | ICD-10-CM | POA: Insufficient documentation

## 2012-01-18 DIAGNOSIS — Z8739 Personal history of other diseases of the musculoskeletal system and connective tissue: Secondary | ICD-10-CM | POA: Insufficient documentation

## 2012-01-18 DIAGNOSIS — E049 Nontoxic goiter, unspecified: Secondary | ICD-10-CM | POA: Insufficient documentation

## 2012-01-18 MED ORDER — OXYCODONE-ACETAMINOPHEN 5-325 MG PO TABS: 1.0000 | ORAL_TABLET | Freq: Four times a day (QID) | ORAL | Status: DC | PRN

## 2012-01-18 MED ORDER — DIAZEPAM 5 MG PO TABS
5.0000 mg | ORAL_TABLET | Freq: Once | ORAL | Status: AC
  Administered 2012-01-18: 5 mg via ORAL
  Filled 2012-01-18: qty 1

## 2012-01-18 MED ORDER — MORPHINE SULFATE 4 MG/ML IJ SOLN
6.0000 mg | Freq: Once | INTRAMUSCULAR | Status: AC
  Administered 2012-01-18: 6 mg via INTRAMUSCULAR
  Filled 2012-01-18: qty 2

## 2012-01-18 MED ORDER — KETOROLAC TROMETHAMINE 60 MG/2ML IM SOLN
60.0000 mg | Freq: Once | INTRAMUSCULAR | Status: AC
  Administered 2012-01-18: 60 mg via INTRAMUSCULAR
  Filled 2012-01-18: qty 2

## 2012-01-18 MED ORDER — PREDNISONE 50 MG PO TABS: 50.0000 mg | ORAL_TABLET | Freq: Every day | ORAL | Status: DC

## 2012-01-18 MED ORDER — DIAZEPAM 5 MG PO TABS: 5.0000 mg | ORAL_TABLET | Freq: Three times a day (TID) | ORAL | Status: DC | PRN

## 2012-01-18 NOTE — ED Notes (Signed)
Pt has called for a ride to pick her up due to medications being administered in ED. Will monitor pt for 15 min for adverse reactions then will d/c.

## 2012-01-18 NOTE — ED Notes (Signed)
Pt reports lower back pain, chronic, worse over last few days. Has used TENS unit, ice and home meds of dilaudid 8mg  and gabapentin 600mg  without relief. Denies urinary symptoms.

## 2012-01-18 NOTE — ED Provider Notes (Signed)
History     CSN: [id number]  Arrival date & time 01/18/12  1204   First MD Initiated Contact with Patient 01/18/12 1310      Chief Complaint  Patient presents with  . Back Pain    (Consider location/radiation/quality/duration/timing/severity/associated sxs/prior treatment) HPI The patient presents to the emergency department with chronic low back pain.  Patient states she called her pain management specialist, and they were not able to see her today.  Patient is requesting refills of her Dilaudid tablets and pain control for her low back pain.  Patient denies numbness or weakness in her lower extremities.  Patient states that she's having more pain today and she normally has.  Patient denies nausea, vomiting, abdominal pain, headache, fever, weakness, or numbness.  Past Medical History  Diagnosis Date  . Anxiety   . Depression   . Arthritis   . Osteoarthritis   . Thyroid enlarged     PT STATES TOLD HER THYROID ENLARGED--BUT NO TX.  PT TAKES OTC THYROID MEDICATION    Past Surgical History  Procedure Date  . Appendectomy 2011  . Lumbar laminectomy/decompression microdiscectomy 01/10/2011    Procedure: LUMBAR LAMINECTOMY/DECOMPRESSION MICRODISCECTOMY;  Surgeon: Javier Docker;  Location: [facility];  Service: Orthopedics;  Laterality: N/A;  Decompression L4 - L5  (X-Ray)  . Lumbar laminectomy/decompression microdiscectomy 08/02/2011    Procedure: LUMBAR LAMINECTOMY/DECOMPRESSION MICRODISCECTOMY;  Surgeon: Javier Docker, MD;  Location: [facility];  Service: Orthopedics;  Laterality: N/A;  Re-do Decompression L4-L5    No family history on file.  History  Substance Use Topics  . Smoking status: Former Games developer  . Smokeless tobacco: Never Used  . Alcohol Use: No    OB History    Grav Para Term Preterm Abortions TAB SAB Ect Mult Living                  Review of Systems All other systems negative except as documented in the HPI. All pertinent positives and negatives as reviewed in  the HPI.  Allergies  Hydrocodone  Home Medications   Current Outpatient Rx  Name  Route  Sig  Dispense  Refill  . CLONAZEPAM 1 MG PO TABS   Oral   Take 1 mg by mouth 2 (two) times daily as needed. anxiety         . GABAPENTIN (PHN) 600 MG PO TABS   Oral   Take 1,800 mg by mouth at bedtime.          Marland Kitchen HYDROMORPHONE HCL 8 MG PO TABS   Oral   Take 8 mg by mouth every 4 (four) hours as needed. For pain.         Marland Kitchen SERTRALINE HCL 100 MG PO TABS   Oral   Take 100 mg by mouth daily.         Marland Kitchen VITAMIN C 500 MG PO TABS   Oral   Take 500 mg by mouth daily.           BP 108/63  Pulse 92  Temp 98.6 F (37 C) (Oral)  Resp 20  SpO2 99%  Physical Exam  Constitutional: She is oriented to person, place, and time. She appears well-developed and well-nourished. No distress.  HENT:  Head: Normocephalic and atraumatic.  Mouth/Throat: Oropharynx is clear and moist.  Eyes: Pupils are equal, round, and reactive to light.  Neck: Normal range of motion. Neck supple.  Cardiovascular: Normal rate, regular rhythm and normal heart sounds.   Pulmonary/Chest: Effort normal  and breath sounds normal.  Musculoskeletal:       Lumbar back: She exhibits tenderness, pain and spasm. She exhibits normal range of motion, no bony tenderness and no deformity.  Neurological: She is alert and oriented to person, place, and time. She has normal reflexes. She exhibits normal muscle tone. Coordination normal.  Skin: Skin is warm and dry. No rash noted.    ED Course  Procedures (including critical care time) The patient is, advised she'll need to follow up with her pain specialist for further prescriptions.  Patient also advised to return here for any worsening in her condition.  Told to use heat on her lower back.  MDM          Carlyle Dolly, PA-C 01/18/12 1348

## 2012-01-21 NOTE — ED Provider Notes (Signed)
Medical screening examination/treatment/procedure(s) were performed by non-physician practitioner and as supervising physician I was immediately available for consultation/collaboration.   Richardean Canal, MD 01/21/12 [phone]

## 2012-01-22 ENCOUNTER — Other Ambulatory Visit: Payer: Self-pay | Admitting: Specialist

## 2012-01-22 DIAGNOSIS — M5126 Other intervertebral disc displacement, lumbar region: Secondary | ICD-10-CM

## 2012-01-25 ENCOUNTER — Ambulatory Visit
Admission: RE | Admit: 2012-01-25 | Discharge: 2012-01-25 | Disposition: A | Discharge status: Home / Self Care | Payer: PRIVATE HEALTH INSURANCE | Source: Ambulatory Visit | Attending: Specialist | Admitting: Specialist

## 2012-01-25 DIAGNOSIS — M5126 Other intervertebral disc displacement, lumbar region: Secondary | ICD-10-CM

## 2012-01-25 MED ORDER — GADOBENATE DIMEGLUMINE 529 MG/ML IV SOLN
19.0000 mL | Freq: Once | INTRAVENOUS | Status: AC | PRN
  Administered 2012-01-25: 19 mL via INTRAVENOUS

## 2012-03-01 ENCOUNTER — Emergency Department (HOSPITAL_COMMUNITY)
Admission: EM | Admit: 2012-03-01 | Discharge: 2012-03-01 | Disposition: A | Discharge status: Home / Self Care | Payer: PRIVATE HEALTH INSURANCE | Attending: Emergency Medicine | Admitting: Emergency Medicine

## 2012-03-01 ENCOUNTER — Emergency Department (HOSPITAL_COMMUNITY): Payer: PRIVATE HEALTH INSURANCE

## 2012-03-01 ENCOUNTER — Encounter (HOSPITAL_COMMUNITY): Payer: Self-pay | Admitting: Emergency Medicine

## 2012-03-01 DIAGNOSIS — M549 Dorsalgia, unspecified: Secondary | ICD-10-CM

## 2012-03-01 DIAGNOSIS — S0990XA Unspecified injury of head, initial encounter: Secondary | ICD-10-CM | POA: Insufficient documentation

## 2012-03-01 DIAGNOSIS — Y929 Unspecified place or not applicable: Secondary | ICD-10-CM | POA: Insufficient documentation

## 2012-03-01 DIAGNOSIS — Z9889 Other specified postprocedural states: Secondary | ICD-10-CM | POA: Insufficient documentation

## 2012-03-01 DIAGNOSIS — F329 Major depressive disorder, single episode, unspecified: Secondary | ICD-10-CM | POA: Insufficient documentation

## 2012-03-01 DIAGNOSIS — F411 Generalized anxiety disorder: Secondary | ICD-10-CM | POA: Insufficient documentation

## 2012-03-01 DIAGNOSIS — M5410 Radiculopathy, site unspecified: Secondary | ICD-10-CM

## 2012-03-01 DIAGNOSIS — F3289 Other specified depressive episodes: Secondary | ICD-10-CM | POA: Insufficient documentation

## 2012-03-01 DIAGNOSIS — Y939 Activity, unspecified: Secondary | ICD-10-CM | POA: Insufficient documentation

## 2012-03-01 DIAGNOSIS — W010XXA Fall on same level from slipping, tripping and stumbling without subsequent striking against object, initial encounter: Secondary | ICD-10-CM | POA: Insufficient documentation

## 2012-03-01 DIAGNOSIS — IMO0002 Reserved for concepts with insufficient information to code with codable children: Secondary | ICD-10-CM | POA: Insufficient documentation

## 2012-03-01 DIAGNOSIS — Z87891 Personal history of nicotine dependence: Secondary | ICD-10-CM | POA: Insufficient documentation

## 2012-03-01 DIAGNOSIS — Z79899 Other long term (current) drug therapy: Secondary | ICD-10-CM | POA: Insufficient documentation

## 2012-03-01 DIAGNOSIS — M1990 Unspecified osteoarthritis, unspecified site: Secondary | ICD-10-CM | POA: Insufficient documentation

## 2012-03-01 DIAGNOSIS — W1809XA Striking against other object with subsequent fall, initial encounter: Secondary | ICD-10-CM | POA: Insufficient documentation

## 2012-03-01 MED ORDER — DEXAMETHASONE SODIUM PHOSPHATE 10 MG/ML IJ SOLN
10.0000 mg | Freq: Once | INTRAMUSCULAR | Status: AC
  Administered 2012-03-01: 10 mg via INTRAMUSCULAR
  Filled 2012-03-01: qty 1

## 2012-03-01 MED ORDER — METHOCARBAMOL 500 MG PO TABS: 500.0000 mg | ORAL_TABLET | Freq: Two times a day (BID) | ORAL | Status: DC

## 2012-03-01 MED ORDER — PREDNISONE 20 MG PO TABS: ORAL_TABLET | ORAL | Status: DC

## 2012-03-01 MED ORDER — KETOROLAC TROMETHAMINE 60 MG/2ML IM SOLN
60.0000 mg | Freq: Once | INTRAMUSCULAR | Status: DC
  Filled 2012-03-01: qty 2

## 2012-03-01 NOTE — ED Provider Notes (Signed)
History     CSN: [id number]  Arrival date & time 03/01/12  3086   First MD Initiated Contact with Patient 03/01/12 [phone]      Chief Complaint  Patient presents with  . Back Pain    (Consider location/radiation/quality/duration/timing/severity/associated sxs/prior treatment) HPI Comments: Patient comes to the ER for evaluation of low back pain. Patient has a history of chronic low back pain. She indicates that she has had increased pain for the week after she fell. Patient tripped and fell, landing on her backside. Pain is in the lower back it radiates down both legs intermittently. Pain is severe it worsens with movement. No change in bowel or bladder function.  Patient is a 42 y.o. female presenting with back pain.  Back Pain   Past Medical History  Diagnosis Date  . Anxiety   . Depression   . Arthritis   . Osteoarthritis     Past Surgical History  Procedure Laterality Date  . Appendectomy  2011  . Lumbar laminectomy/decompression microdiscectomy  01/10/2011    Procedure: LUMBAR LAMINECTOMY/DECOMPRESSION MICRODISCECTOMY;  Surgeon: Javier Docker;  Location: [facility];  Service: Orthopedics;  Laterality: N/A;  Decompression L4 - L5  (X-Ray)  . Lumbar laminectomy/decompression microdiscectomy  08/02/2011    Procedure: LUMBAR LAMINECTOMY/DECOMPRESSION MICRODISCECTOMY;  Surgeon: Javier Docker, MD;  Location: [facility];  Service: Orthopedics;  Laterality: N/A;  Re-do Decompression L4-L5    No family history on file.  History  Substance Use Topics  . Smoking status: Former Games developer  . Smokeless tobacco: Never Used  . Alcohol Use: No    OB History   Grav Para Term Preterm Abortions TAB SAB Ect Mult Living                  Review of Systems  Genitourinary: Negative.   Musculoskeletal: Positive for back pain.  All other systems reviewed and are negative.    Allergies  Hydrocodone  Home Medications   Current Outpatient Rx  Name  Route  Sig  Dispense  Refill  .  clonazePAM (KLONOPIN) 1 MG tablet   Oral   Take 1 mg by mouth 2 (two) times daily as needed. anxiety         . diazepam (VALIUM) 5 MG tablet   Oral   Take 1 tablet (5 mg total) by mouth every 8 (eight) hours as needed (muscle spasms).   10 tablet   0   . Gabapentin, PHN, (GRALISE) 600 MG TABS   Oral   Take 1,800 mg by mouth at bedtime.          Marland Kitchen HYDROmorphone (DILAUDID) 8 MG tablet   Oral   Take 8 mg by mouth every 4 (four) hours as needed. For pain.         Marland Kitchen oxyCODONE-acetaminophen (PERCOCET/ROXICET) 5-325 MG per tablet   Oral   Take 1-2 tablets by mouth every 6 (six) hours as needed for pain.   20 tablet   0   . predniSONE (DELTASONE) 50 MG tablet   Oral   Take 1 tablet (50 mg total) by mouth daily.   5 tablet   0   . sertraline (ZOLOFT) 100 MG tablet   Oral   Take 100 mg by mouth daily.         . vitamin C (ASCORBIC ACID) 500 MG tablet   Oral   Take 500 mg by mouth daily.           BP 138/78  Pulse  56  Temp(Src) 98.4 F (36.9 C) (Oral)  Resp 18  SpO2 100%  Physical Exam  Constitutional: She is oriented to person, place, and time. She appears well-developed and well-nourished. No distress.  HENT:  Head: Normocephalic and atraumatic.  Right Ear: Hearing normal.  Nose: Nose normal.  Mouth/Throat: Oropharynx is clear and moist and mucous membranes are normal.  Eyes: Conjunctivae and EOM are normal. Pupils are equal, round, and reactive to light.  Neck: Normal range of motion. Neck supple.  Cardiovascular: Normal rate, regular rhythm, S1 normal and S2 normal.  Exam reveals no gallop and no friction rub.   No murmur heard. Pulmonary/Chest: Effort normal and breath sounds normal. No respiratory distress. She exhibits no tenderness.  Abdominal: Soft. Normal appearance and bowel sounds are normal. There is no hepatosplenomegaly. There is no tenderness. There is no rebound, no guarding, no tenderness at McBurney's point and negative Murphy's sign. No  hernia.  Musculoskeletal: Normal range of motion.       Lumbar back: She exhibits tenderness and spasm. She exhibits no swelling and no deformity.  Neurological: She is alert and oriented to person, place, and time. She has normal strength. No cranial nerve deficit or sensory deficit. Coordination normal. GCS eye subscore is 4. GCS verbal subscore is 5. GCS motor subscore is 6.  Skin: Skin is warm, dry and intact. No rash noted. No cyanosis.  Psychiatric: She has a normal mood and affect. Her speech is normal and behavior is normal. Thought content normal.    ED Course  Procedures (including critical care time)  Labs Reviewed - No data to display Dg Lumbar Spine Complete  03/01/2012  *RADIOLOGY REPORT*  Clinical Data: Fall with low back pain radiating into both feet. History of prior lumbar surgery.  LUMBAR SPINE - COMPLETE 4+ VIEW  Comparison: Lumbar MRI dated 01/25/2012 and prior lumbar spine films on 11/25/2011.  Findings: The lumbar spine shows normal alignment.  No fracture or subluxation is evident.  Mild disc space narrowing at L5-S1 is stable.  No focal bony lesions.  IMPRESSION: Stable appearance of lumbar spine without acute abnormalities.   Original Report Authenticated By: Irish Lack, M.D.      Diagnosis: 1. Chronic Back Pain 2. Radiculopathy    MDM  Patient presents with increased low back pain. Pain is now radiating down both her legs intermittently. She was on significant amounts of diluted as an outpatient. X-ray does not show any fracture or subluxation. She does not have any neurologic deficit. I did have to have a conversation with the patient, telling her I was not comfortable giving her any additional narcotic analgesia. She seemed upset at this. Patient referred back to her pain management specialist.        Gilda Crease, MD 03/01/12 1027

## 2012-03-01 NOTE — ED Notes (Signed)
Pt states she has chronic back pain and on last Sunday pt tripped over son's toy car and fell on her buttocks hurting her lower back , hit her back and head on fireplace.  Pt c/o pains that radiate down right leg.

## 2012-04-01 ENCOUNTER — Emergency Department (HOSPITAL_COMMUNITY)
Admission: EM | Admit: 2012-04-01 | Discharge: 2012-04-01 | Disposition: A | Discharge status: Home / Self Care | Payer: PRIVATE HEALTH INSURANCE | Attending: Emergency Medicine | Admitting: Emergency Medicine

## 2012-04-01 ENCOUNTER — Emergency Department (HOSPITAL_COMMUNITY): Payer: PRIVATE HEALTH INSURANCE

## 2012-04-01 ENCOUNTER — Encounter (HOSPITAL_COMMUNITY): Payer: Self-pay | Admitting: Emergency Medicine

## 2012-04-01 DIAGNOSIS — M5430 Sciatica, unspecified side: Secondary | ICD-10-CM | POA: Insufficient documentation

## 2012-04-01 DIAGNOSIS — Z87891 Personal history of nicotine dependence: Secondary | ICD-10-CM | POA: Insufficient documentation

## 2012-04-01 DIAGNOSIS — F3289 Other specified depressive episodes: Secondary | ICD-10-CM | POA: Insufficient documentation

## 2012-04-01 DIAGNOSIS — M5432 Sciatica, left side: Secondary | ICD-10-CM

## 2012-04-01 DIAGNOSIS — Z8739 Personal history of other diseases of the musculoskeletal system and connective tissue: Secondary | ICD-10-CM | POA: Insufficient documentation

## 2012-04-01 DIAGNOSIS — F329 Major depressive disorder, single episode, unspecified: Secondary | ICD-10-CM | POA: Insufficient documentation

## 2012-04-01 DIAGNOSIS — F411 Generalized anxiety disorder: Secondary | ICD-10-CM | POA: Insufficient documentation

## 2012-04-01 DIAGNOSIS — R209 Unspecified disturbances of skin sensation: Secondary | ICD-10-CM | POA: Insufficient documentation

## 2012-04-01 DIAGNOSIS — Z79899 Other long term (current) drug therapy: Secondary | ICD-10-CM | POA: Insufficient documentation

## 2012-04-01 MED ORDER — IBUPROFEN 200 MG PO TABS
600.0000 mg | ORAL_TABLET | Freq: Once | ORAL | Status: AC
  Administered 2012-04-01: 600 mg via ORAL
  Filled 2012-04-01: qty 3

## 2012-04-01 MED ORDER — HYDROMORPHONE HCL PF 1 MG/ML IJ SOLN
1.0000 mg | Freq: Once | INTRAMUSCULAR | Status: AC
  Administered 2012-04-01: 1 mg via INTRAVENOUS
  Filled 2012-04-01: qty 1

## 2012-04-01 MED ORDER — KETOROLAC TROMETHAMINE 30 MG/ML IJ SOLN
30.0000 mg | Freq: Once | INTRAMUSCULAR | Status: AC
  Administered 2012-04-01: 30 mg via INTRAVENOUS
  Filled 2012-04-01: qty 1

## 2012-04-01 MED ORDER — HYDROMORPHONE HCL 2 MG PO TABS
2.0000 mg | ORAL_TABLET | Freq: Once | ORAL | Status: AC
  Administered 2012-04-01: 2 mg via ORAL
  Filled 2012-04-01: qty 1

## 2012-04-01 MED ORDER — HYDROCODONE-ACETAMINOPHEN 5-325 MG PO TABS: 1.0000 | ORAL_TABLET | Freq: Four times a day (QID) | ORAL | Status: DC | PRN

## 2012-04-01 MED ORDER — DIAZEPAM 2 MG PO TABS: 2.0000 mg | ORAL_TABLET | Freq: Two times a day (BID) | ORAL | Status: DC | PRN

## 2012-04-01 MED ORDER — IBUPROFEN 600 MG PO TABS: 600.0000 mg | ORAL_TABLET | Freq: Four times a day (QID) | ORAL | Status: DC | PRN

## 2012-04-01 MED ORDER — PREDNISONE 50 MG PO TABS: 50.0000 mg | ORAL_TABLET | Freq: Every day | ORAL | Status: DC

## 2012-04-01 MED ORDER — DIAZEPAM 2 MG PO TABS
2.0000 mg | ORAL_TABLET | Freq: Once | ORAL | Status: AC
  Administered 2012-04-01: 2 mg via ORAL
  Filled 2012-04-01: qty 1

## 2012-04-01 NOTE — Progress Notes (Signed)
Pt confirms pcp is Engineer, civil (consulting) at [facility] EPIC updated  Pain management MD is Ardell Isaacs

## 2012-04-01 NOTE — ED Notes (Signed)
Pt states hx of chronic back pain.  States she had an injection done at her pain clinic and since then has had pain on her left lower back down her leg.  States that she is going numb from her toes to her knee.  Pain 10/10

## 2012-04-01 NOTE — ED Provider Notes (Signed)
History     CSN: [id number]  Arrival date & time 04/01/12  1052   First MD Initiated Contact with Patient 04/01/12 1058      Chief Complaint  Patient presents with  . Back Pain    (Consider location/radiation/quality/duration/timing/severity/associated sxs/prior treatment) Patient is a 42 y.o. female presenting with back pain. The history is provided by the patient.  Back Pain Location:  Lumbar spine and sacro-iliac joint Quality:  Stabbing and shooting Radiates to:  L posterior upper leg and L foot Pain severity:  Severe Pain is:  Same all the time Onset quality:  Gradual Timing:  Constant Progression:  Worsening Chronicity:  Recurrent Worsened by:  Ambulation Ineffective treatments:  Cold packs, muscle relaxants and narcotics Associated symptoms: numbness, paresthesias and tingling   Associated symptoms: no abdominal pain, no bladder incontinence, no bowel incontinence and no chest pain     Past Medical History  Diagnosis Date  . Anxiety   . Depression   . Arthritis   . Osteoarthritis     Past Surgical History  Procedure Laterality Date  . Appendectomy  2011  . Lumbar laminectomy/decompression microdiscectomy  01/10/2011    Procedure: LUMBAR LAMINECTOMY/DECOMPRESSION MICRODISCECTOMY;  Surgeon: Javier Docker;  Location: [facility];  Service: Orthopedics;  Laterality: N/A;  Decompression L4 - L5  (X-Ray)  . Lumbar laminectomy/decompression microdiscectomy  08/02/2011    Procedure: LUMBAR LAMINECTOMY/DECOMPRESSION MICRODISCECTOMY;  Surgeon: Javier Docker, MD;  Location: [facility];  Service: Orthopedics;  Laterality: N/A;  Re-do Decompression L4-L5    No family history on file.  History  Substance Use Topics  . Smoking status: Former Games developer  . Smokeless tobacco: Never Used  . Alcohol Use: No    OB History   Grav Para Term Preterm Abortions TAB SAB Ect Mult Living                  Review of Systems  Constitutional: Negative for activity change.  HENT: Negative  for facial swelling and neck pain.   Respiratory: Negative for cough, shortness of breath and wheezing.   Cardiovascular: Negative for chest pain.  Gastrointestinal: Negative for nausea, vomiting, abdominal pain, diarrhea, constipation, blood in stool, abdominal distention and bowel incontinence.  Genitourinary: Negative for bladder incontinence, hematuria and difficulty urinating.  Musculoskeletal: Positive for back pain.  Skin: Negative for color change.  Neurological: Positive for tingling, numbness and paresthesias. Negative for speech difficulty.  Hematological: Does not bruise/bleed easily.  Psychiatric/Behavioral: Negative for confusion.    Allergies  Hydrocodone  Home Medications   Current Outpatient Rx  Name  Route  Sig  Dispense  Refill  . clonazePAM (KLONOPIN) 1 MG tablet   Oral   Take 1 mg by mouth 2 (two) times daily as needed for anxiety.          . Gabapentin Enacarbil 600 MG TB24   Oral   Take 1,800 mg by mouth at bedtime.         Marland Kitchen HYDROmorphone (DILAUDID) 8 MG tablet   Oral   Take 8 mg by mouth 3 (three) times daily as needed for pain.         Marland Kitchen HYDROmorphone HCl (EXALGO) 12 MG T24A   Oral   Take 24 mg by mouth daily.         . methocarbamol (ROBAXIN) 500 MG tablet   Oral   Take 1 tablet (500 mg total) by mouth 2 (two) times daily.   20 tablet   0   .  sertraline (ZOLOFT) 100 MG tablet   Oral   Take 150 mg by mouth every morning.          Marland Kitchen HYDROcodone-acetaminophen (NORCO/VICODIN) 5-325 MG per tablet   Oral   Take 1 tablet by mouth every 6 (six) hours as needed for pain.   15 tablet   0   . ibuprofen (ADVIL,MOTRIN) 600 MG tablet   Oral   Take 1 tablet (600 mg total) by mouth every 6 (six) hours as needed for pain.   30 tablet   0     BP 143/78  Pulse 90  Temp(Src) 98 F (36.7 C) (Oral)  Resp 22  SpO2 100%  Physical Exam  Nursing note and vitals reviewed. Constitutional: She is oriented to person, place, and time. She  appears well-developed.  HENT:  Head: Normocephalic and atraumatic.  Eyes: Conjunctivae and EOM are normal. Pupils are equal, round, and reactive to light.  Neck: Normal range of motion. Neck supple.  Cardiovascular: Normal rate, regular rhythm and normal heart sounds.   Pulmonary/Chest: Effort normal and breath sounds normal. No respiratory distress.  Abdominal: Soft. Bowel sounds are normal. She exhibits no distension. There is no tenderness. There is no rebound and no guarding.  Musculoskeletal:  Pt has tenderness over the lumbar region No step offs, no erythema Unable to discriminate between sharp and dull for the entire LLE. Poor patellar reflex-left Intact rectal tone     Neurological: She is alert and oriented to person, place, and time.  Skin: Skin is warm and dry.    ED Course  Procedures (including critical care time)  Labs Reviewed - No data to display Dg Lumbar Spine Complete  04/01/2012  *RADIOLOGY REPORT*  Clinical Data: Back pain.  LUMBAR SPINE - COMPLETE 4+ VIEW  Comparison: March 01, 2012.  Findings: No fracture or spondylolisthesis is noted.  Posterior facet joints appear normal. Mild narrowing of L5 S1 disc space is noted which is unchanged.  Other disc spaces appear to be well maintained.  IMPRESSION: No acute abnormality seen involving the lumbar spine.  No change compared to prior exam.   Original Report Authenticated By: Lupita Raider.,  M.D.      1. Sciatica neuralgia, left       MDM  DDx includes: - DJD of the back - Spondylitises/ spondylosis - Sciatica - Spinal cord compression - Conus medullaris - Epidural hematoma - Epidural abscess - Lytic/pathologic fracture - Myelitis - Musculoskeletal pain  Pt with hx of lumbar spine disease, and sciatica, currently managed by pain specialist comes in with cc of back pain. Her pain correlates with Sciatica, however, she states that the pain is worse than usual, and typically doesn't involve the  outer part of her leg. She has baseline neuropathy - and our exam findings are consistent with that. She denies any associated urinary incontinence, urinary retention, bowel incontinence, saddle anesthesia. Intact rectal tone.  Impression is worsening of sciatica. i spoke with her pain team, and Ms. Maisie Fus, [place] who works with Dr. Jordan Likes will set up an appt for the patient. Patient advised to see her pcp as well.  We will send her home with steroids, narcotic and nsaids.    Derwood Kaplan, MD 04/01/12 1340

## 2012-04-01 NOTE — ED Notes (Signed)
Patient transported to X-ray 

## 2012-05-17 ENCOUNTER — Emergency Department (HOSPITAL_COMMUNITY)
Admission: EM | Admit: 2012-05-17 | Discharge: 2012-05-17 | Disposition: A | Discharge status: Home / Self Care | Payer: PRIVATE HEALTH INSURANCE | Attending: Emergency Medicine | Admitting: Emergency Medicine

## 2012-05-17 ENCOUNTER — Encounter (HOSPITAL_COMMUNITY): Payer: Self-pay | Admitting: *Deleted

## 2012-05-17 DIAGNOSIS — M545 Low back pain, unspecified: Secondary | ICD-10-CM | POA: Insufficient documentation

## 2012-05-17 DIAGNOSIS — Z87891 Personal history of nicotine dependence: Secondary | ICD-10-CM | POA: Insufficient documentation

## 2012-05-17 DIAGNOSIS — R269 Unspecified abnormalities of gait and mobility: Secondary | ICD-10-CM | POA: Insufficient documentation

## 2012-05-17 DIAGNOSIS — Z760 Encounter for issue of repeat prescription: Secondary | ICD-10-CM | POA: Insufficient documentation

## 2012-05-17 DIAGNOSIS — Z79899 Other long term (current) drug therapy: Secondary | ICD-10-CM | POA: Insufficient documentation

## 2012-05-17 DIAGNOSIS — G8929 Other chronic pain: Secondary | ICD-10-CM | POA: Insufficient documentation

## 2012-05-17 DIAGNOSIS — F411 Generalized anxiety disorder: Secondary | ICD-10-CM | POA: Insufficient documentation

## 2012-05-17 DIAGNOSIS — F3289 Other specified depressive episodes: Secondary | ICD-10-CM | POA: Insufficient documentation

## 2012-05-17 DIAGNOSIS — M549 Dorsalgia, unspecified: Secondary | ICD-10-CM

## 2012-05-17 DIAGNOSIS — R209 Unspecified disturbances of skin sensation: Secondary | ICD-10-CM | POA: Insufficient documentation

## 2012-05-17 DIAGNOSIS — IMO0002 Reserved for concepts with insufficient information to code with codable children: Secondary | ICD-10-CM | POA: Insufficient documentation

## 2012-05-17 DIAGNOSIS — F329 Major depressive disorder, single episode, unspecified: Secondary | ICD-10-CM | POA: Insufficient documentation

## 2012-05-17 DIAGNOSIS — Z8739 Personal history of other diseases of the musculoskeletal system and connective tissue: Secondary | ICD-10-CM | POA: Insufficient documentation

## 2012-05-17 MED ORDER — HYDROMORPHONE HCL PF 1 MG/ML IJ SOLN
1.0000 mg | Freq: Once | INTRAMUSCULAR | Status: AC
  Administered 2012-05-17: 1 mg via INTRAMUSCULAR
  Filled 2012-05-17: qty 1

## 2012-05-17 MED ORDER — HYDROMORPHONE HCL PF 2 MG/ML IJ SOLN
2.0000 mg | Freq: Once | INTRAMUSCULAR | Status: AC
  Administered 2012-05-17: 2 mg via INTRAMUSCULAR
  Filled 2012-05-17: qty 1

## 2012-05-17 MED ORDER — OXYCODONE-ACETAMINOPHEN 10-325 MG PO TABS: 1.0000 | ORAL_TABLET | ORAL | Status: DC | PRN

## 2012-05-17 NOTE — ED Provider Notes (Signed)
History     CSN: [id number]  Arrival date & time 05/17/12  1035   First MD Initiated Contact with Patient 05/17/12 1057      Chief Complaint  Patient presents with  . Back Pain    lower back pain    (Consider location/radiation/quality/duration/timing/severity/associated sxs/prior treatment) HPI  Patient is a 42 yo F PMHx for chronic back pain presenting to the ED requesting medication refills for her chronic back pain as she was dismissed from her chronic pain management clinic. Patient denies any difference in type of pain, location of pain, or other associated symptoms. She has her baseline nuumbness and tingling her in legs with difficulty ambulating. Patient states she has not tried to contact anyone prior to this weekend regarding her need for her pain medication refills. She has tried to contact Dr. Shelle Iron at [facility] w/o success. Patient denies fevers, chills, nausea, vomiting, or diarrhea.     Past Medical History  Diagnosis Date  . Anxiety   . Depression   . Arthritis   . Osteoarthritis     Past Surgical History  Procedure Laterality Date  . Appendectomy  2011  . Lumbar laminectomy/decompression microdiscectomy  01/10/2011    Procedure: LUMBAR LAMINECTOMY/DECOMPRESSION MICRODISCECTOMY;  Surgeon: Javier Docker;  Location: [facility];  Service: Orthopedics;  Laterality: N/A;  Decompression L4 - L5  (X-Ray)  . Lumbar laminectomy/decompression microdiscectomy  08/02/2011    Procedure: LUMBAR LAMINECTOMY/DECOMPRESSION MICRODISCECTOMY;  Surgeon: Javier Docker, MD;  Location: [facility];  Service: Orthopedics;  Laterality: N/A;  Re-do Decompression L4-L5    No family history on file.  History  Substance Use Topics  . Smoking status: Former Games developer  . Smokeless tobacco: Never Used  . Alcohol Use: No    OB History   Grav Para Term Preterm Abortions TAB SAB Ect Mult Living                  Review of Systems  Musculoskeletal: Positive for back pain and gait  problem.  All other systems reviewed and are negative.    Allergies  Hydrocodone  Home Medications   Current Outpatient Rx  Name  Route  Sig  Dispense  Refill  . clonazePAM (KLONOPIN) 1 MG tablet   Oral   Take 1 mg by mouth 2 (two) times daily as needed for anxiety.          . diazepam (VALIUM) 2 MG tablet   Oral   Take 1 tablet (2 mg total) by mouth every 12 (twelve) hours as needed.   6 tablet   0   . Gabapentin Enacarbil 600 MG TB24   Oral   Take 1,800 mg by mouth at bedtime.         Marland Kitchen HYDROcodone-acetaminophen (NORCO/VICODIN) 5-325 MG per tablet   Oral   Take 1 tablet by mouth every 6 (six) hours as needed for pain.   15 tablet   0   . HYDROmorphone (DILAUDID) 8 MG tablet   Oral   Take 8 mg by mouth 3 (three) times daily as needed for pain.         Marland Kitchen HYDROmorphone HCl (EXALGO) 12 MG T24A   Oral   Take 24 mg by mouth daily.         Marland Kitchen ibuprofen (ADVIL,MOTRIN) 600 MG tablet   Oral   Take 1 tablet (600 mg total) by mouth every 6 (six) hours as needed for pain.   30 tablet   0   .  methocarbamol (ROBAXIN) 500 MG tablet   Oral   Take 1 tablet (500 mg total) by mouth 2 (two) times daily.   20 tablet   0   . oxyCODONE-acetaminophen (PERCOCET) 10-325 MG per tablet   Oral   Take 1 tablet by mouth every 4 (four) hours as needed for pain.   20 tablet   0   . predniSONE (DELTASONE) 50 MG tablet   Oral   Take 1 tablet (50 mg total) by mouth daily.   5 tablet   0   . sertraline (ZOLOFT) 100 MG tablet   Oral   Take 150 mg by mouth every morning.            BP 154/76  Pulse 109  Temp(Src) 99.3 F (37.4 C)  Resp 22  SpO2 99%  Physical Exam  Constitutional: She is oriented to person, place, and time. She appears well-developed and well-nourished.  HENT:  Head: Normocephalic and atraumatic.  Eyes: EOM are normal. Pupils are equal, round, and reactive to light.  Cardiovascular: Normal rate, regular rhythm and normal heart sounds.     Pulmonary/Chest: Effort normal and breath sounds normal.  Abdominal: Soft. Bowel sounds are normal.  Musculoskeletal:       Lumbar back: She exhibits decreased range of motion, tenderness and pain. She exhibits no swelling, no edema, no deformity, no laceration and no spasm.       Back:  Neurological: She is alert and oriented to person, place, and time.  Skin: Skin is warm and dry.  Psychiatric: She has a normal mood and affect.    ED Course  Procedures (including critical care time)  Medications  HYDROmorphone (DILAUDID) injection 1 mg (1 mg Intramuscular Given 05/17/12 1127)  HYDROmorphone (DILAUDID) injection 2 mg (2 mg Intramuscular Given 05/17/12 1301)     Dr. Shelle Iron was consulted regarding pain management issue. He agreed that he was uncomfortable refilling prescription pain medication at the level the patient had been receiving at the pain clinic. He was open to sending her to a new pain management clinic for further management.   Patient was also seen by Dr. Judd Lien and notified that she would not receive her high level of pain medications at this time. She was again offered some Percocets for management and prevention of withdrawals until she can be seen at her appointment with her surgeon this week. Patient was amendable to the plan the second time it was offered to her.   Labs Reviewed - No data to display No results found.   1. Chronic back pain       MDM  No new injury. No neurological deficits and normal neuro exam.  Patient can walk but states is painful.  No loss of bowel or bladder control.  No concern for cauda equina.  No fever, night sweats, weight loss, h/o cancer, IVDU.  Patient advised that she could receive pain medication to cover her until her follow up with her surgeon this week, but she would not be receiving refills for the prescriptions that she receives from the pain clinic. Patient was extensively advised by myself, Dr. Shelle Iron, and Dr. Judd Lien that we were  all uncomfortable refilling her chronic pain medications for her. Advised the patient that we could give her a new referral to a new Pain Clinic. Pt was amendable to plan, but was visibly upset regarding not getting her chronic pain medications refilled. Patient discharged with pain medication to hold her over until her follow up appointment  with her surgeon, Dr. Shelle Iron stated he would discuss pain management with his colleague prior to that appointment regarding this. Patient was given phone number for pain clinic in [place]. Patient was educated on issue of her pain medication by myself, Dr. Shelle Iron, and Dr. Judd Lien. Patient d/w with Dr. Judd Lien, agrees with plan. Patient is stable at time of discharge           Jeannetta Ellis, PA-C 05/17/12 1916

## 2012-05-17 NOTE — ED Notes (Signed)
Pt states she began having lower back pain this past week, growing worse today.  Pt has hx of 2 lumbar surgeries.  Pt sees Dr. Shelle Iron at [facility].  Pt also c/o numbness and tingling in her left foot.  Pt has taken ibuprofen to treat with no relief.  Pt has called Dr. Ermelinda Das office for rx for stronger pain medication, but did not receive a call back.

## 2012-05-17 NOTE — ED Notes (Signed)
Pt c/o sciatica pain bilaterally and bilat lower back pain.  Pt has hx of same and is awaiting a schedule date for a 3rd lumbar fusion.

## 2012-05-18 NOTE — ED Provider Notes (Signed)
Medical screening examination/treatment/procedure(s) were conducted as a shared visit with non-physician practitioner(s) and myself.  I personally evaluated the patient during the encounter.  The patient presents with a flareup of her chronic back pain.  She is followed by pain management for this but was recently displaced from the clinic for some sort of non-compliance, the specifics of which I am unsure.  She is requesting a refill of the 8mg  of dilaudid she takes every four hours.  She denies bowel or bladder incontinence.  On exam, the vitals are stable and the patient is afebrile.  She is sitting somewhat crooked in the exam room and is tearful and sobbing.  She is ttp in the lumbar region.  Strength is 5/5 and reflexes are 1+ and symmetrical in the ble.    The patient was initially seen by the mid-level, but asked to see me when she was not satisfied with the pain medication offered.  I evaluated the patient and she was very upset.  I informed her that I was happy to prescribe her pain medication, however not in the dose and type of medication she wanted.  I am uncomfortable with prescribing large quantities and high doses of dilaudid and informed that this would have to be taken up with pain management.  I did administer im dilaudid and agreed to discharge her with percocet 10's.  She began to sob loudly as I was exiting the room.  She is to follow up with pain management.    Geoffery Lyons, MD 05/18/12 [phone]

## 2012-06-20 ENCOUNTER — Encounter (HOSPITAL_COMMUNITY): Payer: Self-pay | Admitting: Emergency Medicine

## 2012-06-20 ENCOUNTER — Emergency Department (HOSPITAL_COMMUNITY)
Admission: EM | Admit: 2012-06-20 | Discharge: 2012-06-20 | Disposition: A | Discharge status: Home / Self Care | Payer: PRIVATE HEALTH INSURANCE | Attending: Emergency Medicine | Admitting: Emergency Medicine

## 2012-06-20 ENCOUNTER — Emergency Department (HOSPITAL_COMMUNITY): Payer: PRIVATE HEALTH INSURANCE

## 2012-06-20 DIAGNOSIS — F411 Generalized anxiety disorder: Secondary | ICD-10-CM | POA: Insufficient documentation

## 2012-06-20 DIAGNOSIS — Y929 Unspecified place or not applicable: Secondary | ICD-10-CM | POA: Insufficient documentation

## 2012-06-20 DIAGNOSIS — M549 Dorsalgia, unspecified: Secondary | ICD-10-CM | POA: Insufficient documentation

## 2012-06-20 DIAGNOSIS — F3289 Other specified depressive episodes: Secondary | ICD-10-CM | POA: Insufficient documentation

## 2012-06-20 DIAGNOSIS — Z87891 Personal history of nicotine dependence: Secondary | ICD-10-CM | POA: Insufficient documentation

## 2012-06-20 DIAGNOSIS — Y939 Activity, unspecified: Secondary | ICD-10-CM | POA: Insufficient documentation

## 2012-06-20 DIAGNOSIS — W1809XA Striking against other object with subsequent fall, initial encounter: Secondary | ICD-10-CM | POA: Insufficient documentation

## 2012-06-20 DIAGNOSIS — Z8739 Personal history of other diseases of the musculoskeletal system and connective tissue: Secondary | ICD-10-CM | POA: Insufficient documentation

## 2012-06-20 DIAGNOSIS — S0990XA Unspecified injury of head, initial encounter: Secondary | ICD-10-CM

## 2012-06-20 DIAGNOSIS — F329 Major depressive disorder, single episode, unspecified: Secondary | ICD-10-CM | POA: Insufficient documentation

## 2012-06-20 DIAGNOSIS — Z79899 Other long term (current) drug therapy: Secondary | ICD-10-CM | POA: Insufficient documentation

## 2012-06-20 DIAGNOSIS — G8929 Other chronic pain: Secondary | ICD-10-CM | POA: Insufficient documentation

## 2012-06-20 HISTORY — DX: Sciatica, unspecified side: M54.30

## 2012-06-20 MED ORDER — HYDROMORPHONE HCL PF 1 MG/ML IJ SOLN
1.0000 mg | Freq: Once | INTRAMUSCULAR | Status: AC
  Administered 2012-06-20: 1 mg via INTRAMUSCULAR
  Filled 2012-06-20: qty 1

## 2012-06-20 MED ORDER — HYDROMORPHONE HCL PF 1 MG/ML IJ SOLN
1.0000 mg | INTRAMUSCULAR | Status: DC | PRN
  Administered 2012-06-20: 1 mg via INTRAMUSCULAR
  Filled 2012-06-20: qty 1

## 2012-06-20 NOTE — ED Provider Notes (Signed)
History     CSN: [id number]  Arrival date & time 06/20/12  1251   First MD Initiated Contact with Patient 06/20/12 1333      Chief Complaint  Patient presents with  . Fall  . Headache    (Consider location/radiation/quality/duration/timing/severity/associated sxs/prior treatment) HPI Patient presents with concern of left facial pain, lightheadedness. The fall was mechanical, occurred 3 days ago with trauma against the corner of a wall.  Since that time there's been pain about the left TMJ, orbits.  There was brief nausea, but no vomiting.  There was no loss of consciousness.  There is however, persistent pain in the area, described as sore, severe, not improved with home narcotics. She denies unilateral weakness, new falls.  She is dependent on a cane, has a long history of chronic back pain.  Past Medical History  Diagnosis Date  . Anxiety   . Depression   . Arthritis   . Osteoarthritis   . Chronic back pain   . Sciatica     Past Surgical History  Procedure Laterality Date  . Appendectomy  2011  . Lumbar laminectomy/decompression microdiscectomy  01/10/2011    Procedure: LUMBAR LAMINECTOMY/DECOMPRESSION MICRODISCECTOMY;  Surgeon: Javier Docker;  Location: [facility];  Service: Orthopedics;  Laterality: N/A;  Decompression L4 - L5  (X-Ray)  . Lumbar laminectomy/decompression microdiscectomy  08/02/2011    Procedure: LUMBAR LAMINECTOMY/DECOMPRESSION MICRODISCECTOMY;  Surgeon: Javier Docker, MD;  Location: [facility];  Service: Orthopedics;  Laterality: N/A;  Re-do Decompression L4-L5  . Back surgery      No family history on file.  History  Substance Use Topics  . Smoking status: Former Games developer  . Smokeless tobacco: Never Used  . Alcohol Use: No    OB History   Grav Para Term Preterm Abortions TAB SAB Ect Mult Living                  Review of Systems  All other systems reviewed and are negative.    Allergies  Hydrocodone  Home Medications   Current Outpatient  Rx  Name  Route  Sig  Dispense  Refill  . Ascorbic Acid (VITAMIN C) 1000 MG tablet   Oral   Take 1,000 mg by mouth daily.         . clonazePAM (KLONOPIN) 1 MG tablet   Oral   Take 1 mg by mouth 2 (two) times daily as needed for anxiety.          . diazepam (VALIUM) 2 MG tablet   Oral   Take 1 tablet (2 mg total) by mouth every 12 (twelve) hours as needed.   6 tablet   0   . Gabapentin Enacarbil 600 MG TB24   Oral   Take 1,800 mg by mouth at bedtime.         Marland Kitchen HYDROmorphone (DILAUDID) 8 MG tablet   Oral   Take 8 mg by mouth 3 (three) times daily as needed for pain.         Marland Kitchen HYDROmorphone HCl (EXALGO) 12 MG T24A   Oral   Take 12 mg by mouth 2 (two) times daily.          . magnesium oxide (MAG-OX) 400 MG tablet   Oral   Take 400 mg by mouth daily.         . methocarbamol (ROBAXIN) 500 MG tablet   Oral   Take 1 tablet (500 mg total) by mouth 2 (two) times daily.  20 tablet   0   . Multiple Vitamin (MULTIVITAMIN WITH MINERALS) TABS   Oral   Take 1 tablet by mouth daily.         Marland Kitchen venlafaxine XR (EFFEXOR-XR) 37.5 MG 24 hr capsule   Oral   Take 37.5 mg by mouth daily.           BP 156/89  Pulse 79  Temp(Src) 98.2 F (36.8 C) (Oral)  Resp 16  SpO2 99%  Physical Exam  Nursing note and vitals reviewed. Constitutional: She is oriented to person, place, and time. She appears well-developed and well-nourished.  Uncomfortable appearing female wearing sunglasses, hunched over in bed  HENT:  Head: Normocephalic.  There is tenderness to palpation about the left orbit, left TMJ, left temporal area.  There is also tenderness to palpation about the left maxilla  Eyes: Conjunctivae and EOM are normal.  Periorbital edema limits some visualization of extraocular eye motion, though there appears to be slight deficiency on the left  Neck: Neck supple.  No deformities, no significant limits of range of motion  Cardiovascular: Normal rate and regular rhythm.    Pulmonary/Chest: Effort normal and breath sounds normal. No stridor. No respiratory distress.  Abdominal: She exhibits no distension.  Musculoskeletal: She exhibits no edema.  Neurological: She is alert and oriented to person, place, and time. She displays no atrophy. No cranial nerve deficit or sensory deficit. She exhibits normal muscle tone.  Skin: Skin is warm and dry.  Psychiatric: She has a normal mood and affect.    ED Course  Procedures (including critical care time)  Labs Reviewed - No data to display No results found.   No diagnosis found.  Pulse ox 99% room air normal   MDM  Patient presents is a fall with pain persistently about the left face and head.  With persistency of lightheadedness, or some suspicion for concussion, though with the bony tenderness, there is some concern for fracture versus intracranial hemorrhage.  CT was unremarkable, she is discharged in stable condition to follow up with her physicians for additional analgesia.        Gerhard Munch, MD 06/20/12 [phone]

## 2012-06-20 NOTE — ED Notes (Signed)
Pt c/o headache x3 days.  Pt reports falling Wednesday and hitting her head on the corner of the wall. Sts she a discogram done the day before.  Pt is hx of falls due to hx of back surgeries.  Denies LOC or nausea.  Reports headache pain 7/10 and feeling lightheaded and fatigued after fall.  Reports small lac on l side of head that bleed all day Wednesday.  Pt is alert and oriented and NAD.

## 2012-06-20 NOTE — ED Notes (Signed)
Patient transported to CT 

## 2012-12-08 ENCOUNTER — Encounter (HOSPITAL_COMMUNITY): Payer: Self-pay | Admitting: Emergency Medicine

## 2012-12-08 ENCOUNTER — Emergency Department (HOSPITAL_COMMUNITY): Payer: PRIVATE HEALTH INSURANCE

## 2012-12-08 ENCOUNTER — Emergency Department (HOSPITAL_COMMUNITY)
Admission: EM | Admit: 2012-12-08 | Discharge: 2012-12-08 | Disposition: A | Discharge status: Home / Self Care | Payer: PRIVATE HEALTH INSURANCE | Attending: Emergency Medicine | Admitting: Emergency Medicine

## 2012-12-08 DIAGNOSIS — Z79899 Other long term (current) drug therapy: Secondary | ICD-10-CM | POA: Insufficient documentation

## 2012-12-08 DIAGNOSIS — Z87891 Personal history of nicotine dependence: Secondary | ICD-10-CM | POA: Insufficient documentation

## 2012-12-08 DIAGNOSIS — M545 Low back pain, unspecified: Secondary | ICD-10-CM | POA: Insufficient documentation

## 2012-12-08 DIAGNOSIS — F329 Major depressive disorder, single episode, unspecified: Secondary | ICD-10-CM | POA: Insufficient documentation

## 2012-12-08 DIAGNOSIS — G8929 Other chronic pain: Secondary | ICD-10-CM | POA: Insufficient documentation

## 2012-12-08 DIAGNOSIS — M25559 Pain in unspecified hip: Secondary | ICD-10-CM | POA: Insufficient documentation

## 2012-12-08 DIAGNOSIS — Z3202 Encounter for pregnancy test, result negative: Secondary | ICD-10-CM | POA: Insufficient documentation

## 2012-12-08 DIAGNOSIS — M25552 Pain in left hip: Secondary | ICD-10-CM

## 2012-12-08 DIAGNOSIS — F411 Generalized anxiety disorder: Secondary | ICD-10-CM | POA: Insufficient documentation

## 2012-12-08 DIAGNOSIS — F3289 Other specified depressive episodes: Secondary | ICD-10-CM | POA: Insufficient documentation

## 2012-12-08 DIAGNOSIS — Z9889 Other specified postprocedural states: Secondary | ICD-10-CM | POA: Insufficient documentation

## 2012-12-08 DIAGNOSIS — Z8739 Personal history of other diseases of the musculoskeletal system and connective tissue: Secondary | ICD-10-CM | POA: Insufficient documentation

## 2012-12-08 LAB — PREGNANCY, URINE: Preg Test, Ur: NEGATIVE

## 2012-12-08 MED ORDER — KETOROLAC TROMETHAMINE 30 MG/ML IJ SOLN
30.0000 mg | Freq: Once | INTRAMUSCULAR | Status: AC
  Administered 2012-12-08: 30 mg via INTRAMUSCULAR
  Filled 2012-12-08: qty 1

## 2012-12-08 MED ORDER — DIAZEPAM 5 MG/ML IJ SOLN
5.0000 mg | Freq: Once | INTRAMUSCULAR | Status: AC
  Administered 2012-12-08: 5 mg via INTRAMUSCULAR
  Filled 2012-12-08: qty 2

## 2012-12-08 MED ORDER — HYDROMORPHONE HCL PF 2 MG/ML IJ SOLN
2.0000 mg | Freq: Once | INTRAMUSCULAR | Status: AC
  Administered 2012-12-08: 2 mg via INTRAMUSCULAR
  Filled 2012-12-08: qty 1

## 2012-12-08 NOTE — ED Notes (Signed)
Patient reports that she fell 2 days ago because she was unable to move her left leg because of pain. Patient states she has chronic back pain due to surgeries. Patient states she is currently taking Exalgo 8 mg bid and Dilaudid 8 mg tabs bid and the pain continues and is worse since the fall on Saturday. Patient reports that she last took "Exalgo last night and Tylenol last night to see if anything else would work."

## 2012-12-08 NOTE — ED Notes (Signed)
EDP at bedside Private security remains at bedside

## 2012-12-08 NOTE — ED Provider Notes (Signed)
CSN: [id number]     Arrival date & time 12/08/12  1109 History  This chart was scribed for non-physician practitioner Santiago Glad, PA-C, working with Suzi Roots, MD by Dorothey Baseman, ED Scribe. This patient was seen in room WTR9/WTR9 and the patient's care was started at 12:21 PM.    Chief Complaint  Patient presents with  . Back Pain   The history is provided by the patient. No language interpreter was used.   HPI Comments: Judith Hardy is a 42 y.o. Female with a history of chronic back pain, arthritis, and osteoarthritis who presents to the Emergency Department complaining of a constant pain to the lower back  And left hip that she states feels similar to her chronic pain, but has been progressively worsening over the past 2 days after a fall.  She reports that two days ago she slipped and landed on her lower back and buttocks.  Patient reports that the pain is exacerbated with bearing weight and movement. Patient uses a cane for daily ambulation.  She currently takes Dilaudid 8 mg BID for her chronic back pain.  She reports associated numbness and tingling of her left leg, which she reports is baseline.  She denies fever or chills.  Denies bowel or bladder incontinence.    Past Medical History  Diagnosis Date  . Anxiety   . Depression   . Arthritis   . Osteoarthritis   . Chronic back pain   . Sciatica    Past Surgical History  Procedure Laterality Date  . Appendectomy  2011  . Lumbar laminectomy/decompression microdiscectomy  01/10/2011    Procedure: LUMBAR LAMINECTOMY/DECOMPRESSION MICRODISCECTOMY;  Surgeon: Javier Docker;  Location: [facility];  Service: Orthopedics;  Laterality: N/A;  Decompression L4 - L5  (X-Ray)  . Lumbar laminectomy/decompression microdiscectomy  08/02/2011    Procedure: LUMBAR LAMINECTOMY/DECOMPRESSION MICRODISCECTOMY;  Surgeon: Javier Docker, MD;  Location: [facility];  Service: Orthopedics;  Laterality: N/A;  Re-do Decompression L4-L5  . Back surgery      Family History  Problem Relation Age of Onset  . Heart failure Mother    History  Substance Use Topics  . Smoking status: Former Games developer  . Smokeless tobacco: Never Used  . Alcohol Use: No   OB History   Grav Para Term Preterm Abortions TAB SAB Ect Mult Living                 Review of Systems  A complete 10 system review of systems was obtained and all systems are negative except as noted in the HPI and PMH.   Allergies  Hydrocodone  Home Medications   Current Outpatient Rx  Name  Route  Sig  Dispense  Refill  . Ascorbic Acid (VITAMIN C) 1000 MG tablet   Oral   Take 1,000 mg by mouth daily.         . clonazePAM (KLONOPIN) 1 MG tablet   Oral   Take 1 mg by mouth 2 (two) times daily as needed for anxiety.          . Gabapentin Enacarbil 600 MG TB24   Oral   Take 1,800 mg by mouth at bedtime.         Marland Kitchen HYDROmorphone (DILAUDID) 8 MG tablet   Oral   Take 8 mg by mouth 3 (three) times daily as needed for pain.         Marland Kitchen HYDROmorphone HCl (EXALGO) 12 MG T24A   Oral   Take 12 mg  by mouth 2 (two) times daily.          Marland Kitchen venlafaxine (EFFEXOR) 75 MG tablet   Oral   Take 225 mg by mouth daily.          Triage Vitals: BP 139/85  Pulse 113  Temp(Src) 98.2 F (36.8 C) (Oral)  Resp 18  SpO2 98%  Physical Exam  Nursing note and vitals reviewed. Constitutional: She is oriented to person, place, and time. She appears well-developed and well-nourished. No distress.  HENT:  Head: Normocephalic and atraumatic.  Eyes: Conjunctivae are normal.  Neck: Normal range of motion. Neck supple.  Cardiovascular: Normal rate, regular rhythm and normal heart sounds.   Pulmonary/Chest: Effort normal and breath sounds normal. No respiratory distress.  Abdominal: She exhibits no distension.  Musculoskeletal: Normal range of motion.       Cervical back: She exhibits normal range of motion, no tenderness, no bony tenderness, no swelling, no edema and no deformity.        Thoracic back: She exhibits normal range of motion, no tenderness, no bony tenderness, no swelling, no edema and no deformity.       Lumbar back: She exhibits tenderness and bony tenderness. She exhibits normal range of motion, no swelling, no edema and no deformity.  Neurological: She is alert and oriented to person, place, and time. She has normal strength and normal reflexes. No sensory deficit.  Reflex Scores:      Patellar reflexes are 2+ on the right side and 2+ on the left side.      Achilles reflexes are 2+ on the right side and 2+ on the left side. Normal strength against resistance of bilateral lower extremities.  Patient ambulates with a cane, which she reports is baseline.  Skin: Skin is warm and dry.  Psychiatric: She has a normal mood and affect. Her behavior is normal.    ED Course  Procedures (including critical care time)  DIAGNOSTIC STUDIES: Oxygen Saturation is 98% on room air, normal by my interpretation.    COORDINATION OF CARE: 12:15 PM- Will order an x-ray of the L spine. Will order an x-ray of the left hip at the patient's request. Will order Dilaudid to manage symptoms. Discussed treatment plan with patient at bedside and patient verbalized agreement.   1:45 PM- Patient reports that her symptoms have not significantly improved after receiving the Dilaudid. Will order Valium to manage symptoms. Discussed treatment plan with patient at bedside and patient verbalized agreement.   2:14 PM- Discussed that x-ray results were negative. Patient reports that her pain has not improved. Will order an injection of Toradol to manage symptoms. Discussed treatment plan with patient at bedside and patient verbalized agreement.    Labs Review Labs Reviewed  PREGNANCY, URINE   Imaging Review Dg Lumbar Spine Complete  12/08/2012   CLINICAL DATA:  Fall, back pain, leg pain  EXAM: LUMBAR SPINE - COMPLETE 4+ VIEW  COMPARISON:  04/01/2012  FINDINGS: Five views of lumbar spine  submitted. No acute fracture or subluxation. IUD noted midline pelvis. Alignment and vertebral heights are preserved.  IMPRESSION: No acute fracture or subluxation.   Electronically Signed   By: Natasha Mead M.D.   On: 12/08/2012 14:00   Dg Hip Complete Left  12/08/2012   CLINICAL DATA:  Status post fall with low back pain radiating into the left hip.  EXAM: LEFT HIP - COMPLETE 2+ VIEW  COMPARISON:  None.  FINDINGS: There is no evidence of hip fracture or  dislocation. There are degenerative joint changes of the superior lateral left acetabulum of osteophytosis and fibrocystic degenerative change.  IMPRESSION: No acute fracture or dislocation.   Electronically Signed   By: Sherian Rein M.D.   On: 12/08/2012 14:06    EKG Interpretation   None       MDM  No diagnosis found. Patient with back pain.  No neurological deficits and normal neuro exam.  Patient can walk but states is painful.  No loss of bowel or bladder control.  No concern for cauda equina.  No fever, night sweats, weight loss, h/o cancer, IVDU.  Patient given pain medication in the ED, but was not discharged home with pain medication due to the fact that she is followed by Pain Management.  Patient stable for discharge.  Return precautions given.  I personally performed the services described in this documentation, which was scribed in my presence. The recorded information has been reviewed and is accurate.     Santiago Glad, PA-C 12/10/12 1122

## 2012-12-11 NOTE — ED Provider Notes (Signed)
Medical screening examination/treatment/procedure(s) were performed by non-physician practitioner and as supervising physician I was immediately available for consultation/collaboration.  EKG Interpretation   None         Suzi Roots, MD 12/11/12 [phone]

## 2013-03-04 ENCOUNTER — Emergency Department (HOSPITAL_COMMUNITY)
Admission: EM | Admit: 2013-03-04 | Discharge: 2013-03-04 | Disposition: A | Discharge status: Home / Self Care | Payer: PRIVATE HEALTH INSURANCE | Attending: Emergency Medicine | Admitting: Emergency Medicine

## 2013-03-04 ENCOUNTER — Encounter (HOSPITAL_COMMUNITY): Payer: Self-pay | Admitting: Emergency Medicine

## 2013-03-04 DIAGNOSIS — M1990 Unspecified osteoarthritis, unspecified site: Secondary | ICD-10-CM | POA: Insufficient documentation

## 2013-03-04 DIAGNOSIS — M5430 Sciatica, unspecified side: Secondary | ICD-10-CM | POA: Insufficient documentation

## 2013-03-04 DIAGNOSIS — Z9889 Other specified postprocedural states: Secondary | ICD-10-CM | POA: Insufficient documentation

## 2013-03-04 DIAGNOSIS — Z79899 Other long term (current) drug therapy: Secondary | ICD-10-CM | POA: Insufficient documentation

## 2013-03-04 DIAGNOSIS — F411 Generalized anxiety disorder: Secondary | ICD-10-CM | POA: Insufficient documentation

## 2013-03-04 DIAGNOSIS — G8929 Other chronic pain: Secondary | ICD-10-CM | POA: Insufficient documentation

## 2013-03-04 DIAGNOSIS — Z87891 Personal history of nicotine dependence: Secondary | ICD-10-CM | POA: Insufficient documentation

## 2013-03-04 DIAGNOSIS — F329 Major depressive disorder, single episode, unspecified: Secondary | ICD-10-CM | POA: Insufficient documentation

## 2013-03-04 DIAGNOSIS — Z791 Long term (current) use of non-steroidal anti-inflammatories (NSAID): Secondary | ICD-10-CM | POA: Insufficient documentation

## 2013-03-04 DIAGNOSIS — F3289 Other specified depressive episodes: Secondary | ICD-10-CM | POA: Insufficient documentation

## 2013-03-04 DIAGNOSIS — M549 Dorsalgia, unspecified: Secondary | ICD-10-CM

## 2013-03-04 MED ORDER — HYDROMORPHONE HCL PF 2 MG/ML IJ SOLN
2.0000 mg | Freq: Once | INTRAMUSCULAR | Status: AC
  Administered 2013-03-04: 2 mg via INTRAMUSCULAR
  Filled 2013-03-04: qty 1

## 2013-03-04 MED ORDER — TRAMADOL HCL 50 MG PO TABS: 50.0000 mg | ORAL_TABLET | Freq: Four times a day (QID) | ORAL | Status: DC | PRN

## 2013-03-04 MED ORDER — ONDANSETRON 4 MG PO TBDP
4.0000 mg | ORAL_TABLET | Freq: Once | ORAL | Status: AC
  Administered 2013-03-04: 4 mg via ORAL
  Filled 2013-03-04: qty 1

## 2013-03-04 NOTE — ED Notes (Signed)
Pt states chronic back pain and is out of control at this time.  Pt states she is needing new pain doctor.

## 2013-03-04 NOTE — ED Provider Notes (Signed)
CSN: [id number]     Arrival date & time 03/04/13  1042 History   First MD Initiated Contact with Patient 03/04/13 1123     Chief Complaint  Patient presents with  . Back Pain     (Consider location/radiation/quality/duration/timing/severity/associated sxs/prior Treatment) HPI This is a 43 year old female with a past medical history of chronic sciatic pain.  Patient present C. emergency department with chief complaint of worsening severe pain and sciatica.  The patient states that she has been tapering herself off of her pain medications because she asked her pain care doctor to do so.  She states that she was taking extended-release hydromorphone 24 mg daily along with 8 mg by mouth 3 times daily.  Patient states about a month and half ago she started weaning herself down off of the medication which is when her pain began to worsen.  The patient states that a week ago she quit going to hurt pain care doctor because she did not feel that she thinks her for properly because she was being tapered down off of her medications.  The patient denies any withdrawal symptoms. She does have chronic severe intermittent shooting pain down the left side of her leg both in the back and in the front.  She states she is unable to ambulate well she uses a walker.  Denies weakness, loss of bowel/bladder function or saddle anesthesia. Denies neck stiffness, headache, rash.  Denies fever or recent procedures to back.  Past Medical History  Diagnosis Date  . Anxiety   . Depression   . Arthritis   . Osteoarthritis   . Chronic back pain   . Sciatica    Past Surgical History  Procedure Laterality Date  . Appendectomy  2011  . Lumbar laminectomy/decompression microdiscectomy  01/10/2011    Procedure: LUMBAR LAMINECTOMY/DECOMPRESSION MICRODISCECTOMY;  Surgeon: [name];  Location: [facility];  Service: Orthopedics;  Laterality: N/A;  Decompression L4 - L5  (X-Ray)  . Lumbar laminectomy/decompression  microdiscectomy  08/02/2011    Procedure: LUMBAR LAMINECTOMY/DECOMPRESSION MICRODISCECTOMY;  Surgeon: [name], MD;  Location: [facility];  Service: Orthopedics;  Laterality: N/A;  Re-do Decompression L4-L5  . Back surgery     Family History  Problem Relation Age of Onset  . Heart failure Mother    History  Substance Use Topics  . Smoking status: Former Games developermoker  . Smokeless tobacco: Never Used  . Alcohol Use: No   OB History   Grav Para Term Preterm Abortions TAB SAB Ect Mult Living                 Review of Systems  Ten systems reviewed and are negative for acute change, except as noted in the HPI.    Allergies  Hydrocodone  Home Medications   Current Outpatient Rx  Name  Route  Sig  Dispense  Refill  . acetaminophen (TYLENOL) 650 MG CR tablet   Oral   Take 1,300 mg by mouth every 8 (eight) hours as needed for pain.         . clonazePAM (KLONOPIN) 1 MG tablet   Oral   Take 1 mg by mouth 2 (two) times daily as needed for anxiety.          . Gabapentin Enacarbil 600 MG TB24   Oral   Take 1,800 mg by mouth at bedtime.         Marland Kitchen. LYSINE PO   Oral   Take 3 tablets by mouth daily.         .Marland Kitchen  Magnesium 400 MG CAPS   Oral   Take 400 mg by mouth daily.         . meloxicam (MOBIC) 7.5 MG tablet   Oral   Take 7.5 mg by mouth daily.         . traZODone (DESYREL) 100 MG tablet   Oral   Take 100 mg by mouth at bedtime.         Marland Kitchen venlafaxine XR (EFFEXOR-XR) 150 MG 24 hr capsule   Oral   Take 300 mg by mouth daily with breakfast.         . Ascorbic Acid (VITAMIN C) 1000 MG tablet   Oral   Take 1,000 mg by mouth daily.         Marland Kitchen HYDROmorphone (DILAUDID) 8 MG tablet   Oral   Take 8 mg by mouth 3 (three) times daily as needed for pain.         Marland Kitchen HYDROmorphone HCl (EXALGO) 12 MG T24A   Oral   Take 12 mg by mouth 2 (two) times daily.           BP 137/79  Pulse 98  Temp(Src) 98.4 F (36.9 C) (Oral)  Resp 20  SpO2 100% Physical Exam   Constitutional: She appears well-developed and well-nourished. No distress.  Appears uncomfortable   HENT:  Head: Normocephalic and atraumatic.  Eyes: Conjunctivae are normal. No scleral icterus.  Neck: Normal range of motion.  Cardiovascular: Normal rate, regular rhythm and normal heart sounds.  Exam reveals no gallop and no friction rub.   No murmur heard. Pulmonary/Chest: Effort normal and breath sounds normal. No respiratory distress.  Abdominal: Soft. Bowel sounds are normal. She exhibits no distension and no mass. There is no tenderness. There is no guarding.  Musculoskeletal: Normal range of motion.  + straight leg test L side  Neurological: She is alert. She has normal reflexes.  Skin: Skin is warm and dry. She is not diaphoretic.    ED Course  Procedures (including critical care time) Labs Review Labs Reviewed - No data to display Imaging Review No results found.  EKG Interpretation   None       MDM   Final diagnoses:  None    The patient is here for her chronic back pain.  She right down the names of both myself and my student and she states she wants to know "is going to be nice to me" for evaluation when she leaves the emergency department.  The patient also thinks that knee when she states that she may need some medication at discharge.  I did address the fact that the emergency department does not treat chronic pain but I would be happy to help her with her pain here in the emergency department.  But I did let the patient know that we have a policy against treating chronic pain.  It and discussed the pathophysiology of the patient's pain especially with withdrawal of her medications and the fact that she would have difficulty controlling it at this time.  I have offered the patient various resources on pain and patient education. Plan to give the patient 2 mg Dilaudid.    Patient states her pain is down to a "tolerable" 8/10 and would like to be discharged  home. I have again discussed the fact we do not provide chronic pain control as I  Have suggested she make an apopointment with her current pain mgmt porvider, and keep the provider until she finds  a new one.  Will d/c with Tramadol today. Patient with back pain.  No neurological deficits and normal neuro exam.  Patient can walk but states is painful.  No loss of bowel or bladder control.  No concern for cauda equina.  No fever, night sweats, weight loss, h/o cancer, IVDU.  RICE protocol and pain medicine indicated and discussed with patient.    Arthor Captain, PA-C 03/06/13 [phone]

## 2013-03-04 NOTE — Discharge Instructions (Signed)
SEEK IMMEDIATE MEDICAL ATTENTION IF: °New numbness, tingling, weakness, or problem with the use of your arms or legs.  °Severe back pain not relieved with medications.  °Change in bowel or bladder control.  °Increasing pain in any areas of the body (such as chest or abdominal pain).  °Shortness of breath, dizziness or fainting.  °Nausea (feeling sick to your stomach), vomiting, fever, or sweats. ° ° °Chronic Pain Discharge Instructions  °Emergency care providers appreciate that many patients coming to us are in severe pain and we wish to address their pain in the safest, most responsible manner.  It is important to recognize however, that the proper treatment of chronic pain differs from that of the pain of injuries and acute illnesses.  Our goal is to provide quality, safe, personalized care and we thank you for giving us the opportunity to serve you. °The use of narcotics and related agents for chronic pain syndromes Guse lead to additional physical and psychological problems.  Nearly as many people die from prescription narcotics each year as die from car crashes.  Additionally, this risk is increased if such prescriptions are obtained from a variety of sources.  Therefore, only your primary care physician or a pain management specialist is able to safely treat such syndromes with narcotic medications long-term.   ° °Documentation revealing such prescriptions have been sought from multiple sources [place] prohibit us from providing a refill or different narcotic medication.  Your name Osgood be checked first through the [place] Controlled Substances Reporting System.  This database is a record of controlled substance medication prescriptions that the patient has received.  This has been established by [place] in an effort to eliminate the dangerous, and often life threatening, practice of obtaining multiple prescriptions from different medical providers.  ° °If you have a chronic pain syndrome (i.e. chronic  headaches, recurrent back or neck pain, dental pain, abdominal or pelvis pain without a specific diagnosis, or neuropathic pain such as fibromyalgia) or recurrent visits for the same condition without an acute diagnosis, you Gr[place]man be treated with non-narcotics and other non-addictive medicines.  Allergic reactions or negative side effects that [place] be reported by a patient to such medications will not typically lead to the use of a narcotic analgesic or other controlled substance as an alternative. °  °Patients managing chronic pain with a personal physician should have provisions in place for breakthrough pain.  If you are in crisis, you should call your physician.  If your physician directs you to the emergency department, please have the doctor call and speak to our attending physician concerning your care. °  °When patients come to the Emergency Department (ED) with acute medical conditions in which the Emergency Department physician feels appropriate to prescribe narcotic or sedating pain medication, the physician will prescribe these in very limited quantities.  The amount of these medications will last only until you can see your primary care physician in his/her office.  Any patient who returns to the ED seeking refills should expect only non-narcotic pain medications.  ° °In the event of an acute medical condition exists and the emergency physician feels it is necessary that the patient be given a narcotic or sedating medication -  a responsible adult driver should be present in the room prior to the medication being given by the nurse. °  °Prescriptions for narcotic or sedating medications that have been lost, stolen or expired will not be refilled in the Emergency Department.   ° °Patients who   have chronic pain may receive non-narcotic prescriptions until seen by their primary care physician.  It is every patients personal responsibility to maintain active prescriptions with his or her primary care  physician or specialist.   Emergency Department Resource Guide 1) Find a Doctor and Pay Out of Pocket Although you won't have to find out who is covered by your insurance plan, it is a good idea to ask around and get recommendations. You will then need to call the office and see if the doctor you have chosen will accept you as a new patient and what types of options they offer for patients who are self-pay. Some doctors offer discounts or will set up payment plans for their patients who do not have insurance, but you will need to ask so you aren't surprised when you get to your appointment.  2) Contact Your Local Health Department Not all health departments have doctors that can see patients for sick visits, but many do, so it is worth a call to see if yours does. If you don't know where your local health department is, you can check in your phone book. The CDC also has a tool to help you locate your state's health department, and many state websites also have listings of all of their local health departments.  3) Find a Walk-in Clinic If your illness is not likely to be very severe or complicated, you may want to try a walk in clinic. These are popping up all over the country in pharmacies, drugstores, and shopping centers. They're usually staffed by nurse practitioners or physician assistants that have been trained to treat common illnesses and complaints. They're usually fairly quick and inexpensive. However, if you have serious medical issues or chronic medical problems, these are probably not your best option.  No Primary Care Doctor: - Call Health Connect at  [phone] - they can help you locate a primary care doctor that  accepts your insurance, provides certain services, etc. - Physician Referral Service- [phone]  Chronic Pain Problems: Organization         Address  Phone   Notes  [facility]  [phone] Patients need to be referred by their primary care  doctor.   Medication Assistance: Organization         Address  Phone   Notes  [facility] Medication [facility] [address] [place] [place], [place] [address] [phone] --Must be a resident of [facility] -- Must have NO insurance coverage whatsoever (no Medicaid/ Medicare, etc.) -- The pt. MUST have a primary care doctor that directs their care regularly and follows them in the community   MedAssist  [phone]   [employer]  [phone]    Agencies that provide inexpensive medical care: Organization         Address  Phone   Notes  [facility]  [phone]   [facility]    [phone]   [facility] [address] [place], [place] [address] [phone]   [facility] [address] [place], [place] [phone]   Planned Parenthood    [phone]   [facility]    [phone]   [facility]  [address], [place] Phone:  [phone], Fax:  [phone] Hours of Operation:  9 am - 6 pm, M-F.  Also accepts Medicaid/Medicare and self-pay.  [facility]  [address], [place] Phone: [phone], Fax: [phone]. Hours of Operation:  8:30 am - 5:30 pm, M-F.  Also accepts Medicaid and self-pay.  [facility] [place] [address] [place] Phone: [phone]   [facility] [address] Natasha Bence [place], [place] [phone], Ext. [phone] Mondays & Thursdays: 7-9 AM.  First 15 patients are seen on a first come, first serve basis.    Medicaid-accepting [facility] Providers:  Organization         Address  Phone   Notes  [facility] [address], [place] [phone] Also accepts self-pay patients.  [facility] [address] Laurell Josephs [place], [place]  [phone]   [facility] [address], [place] [phone]   [facility] [address] [place] [phone]   Renaye Rakers [address], [place]   [phone] Only accepts [place] patients after they have their name applied to their card.   Self-Pay (no insurance) in [facility]:  Organization         Address  Phone   Notes  Sickle Cell Patients, [facility] [address] [place], [place] [phone]   [facility] [address] [place], [place] [phone]   [facility] [place]  [address], [place] [phone]   [facility]/Dr. Osei-Bonsu  [address], [place] or [address], [place] [phone] Phone number for both [place] and [place] locations is the same.  [facility] and [facility] [address] [place] [phone]   [facility] [address] [place] or [address] [phone]   [facility] [address], [place] [phone], phone; [phone], fax Sees patients 1st and 3rd Saturday of every month.  Must not qualify for public or private insurance (i.e. Medicaid, Medicare, [place] Health Choice, Veterans' Benefits)  Household income should be no more than 200% of the poverty level The clinic cannot treat you if you are pregnant or think you are pregnant  Sexually transmitted diseases are not treated at the clinic.    Dental Care: Organization         Address  Phone  Notes  [facility] Department of [facility] [address] [place], [place] [phone] Accepts children up to age 58 who are enrolled in [place] or [place] Health Choice; pregnant women with a Medicaid card; and children who have applied for Medicaid or [place] Health Choice, but were declined, whose parents can pay a reduced fee at time of  service.  [facility] Department of [facility]  [address], [place] [phone] Accepts children up to age 20 who are enrolled in [place] or [place] Health Choice; pregnant women with a Medicaid card; and children who have applied for Medicaid or  Health Choice, but were declined, whose parents can pay a reduced fee at time of service.  [facility]  [address] [place], [place] [phone] Patients are seen by appointment only. Walk-ins are not accepted. [facility] will see patients 36 years of age and older. Monday - Tuesday (8am-5pm) Most Wednesdays (8:30-5pm) $30 per visit, cash only  [facility]  [address], [facility] [phone] Patients are seen by appointment only. Walk-ins are not accepted. [facility] will see patients 16 years of age and older. One Wednesday Evening (Monthly: Volunteer Based).  $30 per visit, cash only  [employer]  [phone] for adults; Children under age 30, call Graduate Pediatric Dentistry at [phone]. Children aged 28-14, please call [phone] to request a pediatric application.  Dental services are provided in all areas of dental care including fillings, crowns and bridges, complete and partial dentures, implants, gum treatment, root canals, and extractions. Preventive care is also provided. Treatment is provided to both adults and children. Patients are selected via a lottery and there is often a waiting list.   [facility] [address], [place]  [phone] [email or web address]   [facility] [address] [place], [place] [phone], Ext. [phone] Second and Fourth Thursday of each month, opens at 6:30 AM; Clinic ends at 9 AM.  Patients are seen on a first-come first-served basis, and a limited number are seen during each clinic.   [facility]  [address] Ether Griffins [place],  [place] [phone]   Eligibility Requirements You must have lived in [place], [place], or [place] counties for at least the last three months.   You cannot be eligible for state or federal sponsored [place], including [employer], [place], or [employer].   You generally cannot be eligible for healthcare insurance through your employer.    How to apply: Eligibility screenings are held every Tuesday and Wednesday afternoon from 1:00 pm until 4:00 pm. You do not need an appointment for the interview!  [facility] [address], [place], [place] [phone]   [facility] Health Department  [phone]   [facility] Health Department  [phone]   [facility] Health Department  [phone]    Behavioral Health Resources in the Community: Intensive Outpatient Programs Organization         Address  Phone  Notes  [facility] [address], [place], [place] [phone]   [facility] Outpatient [address] [place], [place] [phone]   [facility]: [facility] [address], [place], [place]  [phone]   [facility] Mental Health [address]  [place], [place] [phone] or [phone]   Substance Abuse Resources Organization         Address  Phone  Notes  [facility] Drug Services  [phone]   [facility]  [phone]   [facility] [place]  [phone]   Floydene Flock  [phone]   Residential & Outpatient Substance Abuse Program  [phone]   Psychological Services Organization         Address  Phone  Notes  [facility] Behavior[facility]  [phone]   [facility] Services  [phone]   [facility] Mental Health [address] [place] [phone] or [phone]    Mobile Crisis Teams Organization         Address  Phone  Notes  [facility], Mobile Crisis Care Unit  [phone]   [facility]  [address] [place], [place] [phone]   Doristine Locks [address] [place] [phone]    Self-Help/Support Groups Organization         Address  Phone             Notes  Mental Health Assoc. of  - variety of support groups  336- [phone] Call for more information  Narcotics Anonymous (NA), [facility] [address], [place]  2 meetings at this location   Statisticianesidential Treatment Programs Organization         Address  Phone  Notes  [facility] [address],    [place]  [phone]   [facility]  [address], [place] [phone], [place], [place] [phone]   [facility] [address], [place] [phone] Admissions: 8am-3pm M-F  [facility] [address]    [place], [place] [phone]   [facility] [address], [place], [place] [phone]   [facility] [address]  [place], [place] [phone]   [facility] - Intensive Outpatient [address]ure[address], [place], [place] [phone]   [facility] ([facility] [address]  [place], [place] [phone] or [phone]   [facility] ([facility]) [address] [place], [place] [phone] Accepts Medicaid  [facility] [address]  [place] [phone] Substance Abuse/Addiction Treatment   [facility] Behavioral Health Resources Organization         Address  Phone  Notes  [facility]  [phone]) [phone]   Angie FavaJulie Brannon, PhD [address] [place], [place]   [phone]) [phone] or [phone](336) [phone]   [facility]   [address] [place], [place] [phone](336) [phone]   [facility] [address], [place], [place] [phone](336) [phone] Insurance/Medicaid/sponsorship through [facility]  [facility] [address]                                    [place], [place] [phone](336) [phone]  Therapy/tele-psych/case  [facility] [address].   [place], [place] [phone]) [phone]    Dr. Lolly MustacheArfeen  [phone](336) [phone]   [facility] [place]  [employer] [facility] 1) [address], [place] 2) [address], [place] 3)  [address], [place] [phone](336) [phone](336) [phone]  [phone](336) [phone]   [facility] Child Abuse Hotline [phone]) 112-8454(336) 910-873-9493 or [phone](336) [phone] (After Hours)

## 2013-03-06 NOTE — ED Provider Notes (Signed)
Medical screening examination/treatment/procedure(s) were performed by non-physician practitioner and as supervising physician I was immediately available for consultation/collaboration.  EKG Interpretation  None   Almas Rake R. Briyan Kleven, MD 03/06/13 1650 

## 2013-03-08 ENCOUNTER — Emergency Department (HOSPITAL_COMMUNITY)
Admission: EM | Admit: 2013-03-08 | Discharge: 2013-03-08 | Disposition: A | Discharge status: Home / Self Care | Payer: PRIVATE HEALTH INSURANCE | Attending: Emergency Medicine | Admitting: Emergency Medicine

## 2013-03-08 ENCOUNTER — Encounter (HOSPITAL_COMMUNITY): Payer: Self-pay | Admitting: Emergency Medicine

## 2013-03-08 DIAGNOSIS — G8929 Other chronic pain: Secondary | ICD-10-CM | POA: Insufficient documentation

## 2013-03-08 DIAGNOSIS — F411 Generalized anxiety disorder: Secondary | ICD-10-CM | POA: Insufficient documentation

## 2013-03-08 DIAGNOSIS — Z791 Long term (current) use of non-steroidal anti-inflammatories (NSAID): Secondary | ICD-10-CM | POA: Insufficient documentation

## 2013-03-08 DIAGNOSIS — Z79899 Other long term (current) drug therapy: Secondary | ICD-10-CM | POA: Insufficient documentation

## 2013-03-08 DIAGNOSIS — Z8739 Personal history of other diseases of the musculoskeletal system and connective tissue: Secondary | ICD-10-CM | POA: Insufficient documentation

## 2013-03-08 DIAGNOSIS — M545 Low back pain, unspecified: Secondary | ICD-10-CM | POA: Insufficient documentation

## 2013-03-08 DIAGNOSIS — F329 Major depressive disorder, single episode, unspecified: Secondary | ICD-10-CM | POA: Insufficient documentation

## 2013-03-08 DIAGNOSIS — Z3202 Encounter for pregnancy test, result negative: Secondary | ICD-10-CM | POA: Insufficient documentation

## 2013-03-08 DIAGNOSIS — M79609 Pain in unspecified limb: Secondary | ICD-10-CM | POA: Insufficient documentation

## 2013-03-08 DIAGNOSIS — Z87891 Personal history of nicotine dependence: Secondary | ICD-10-CM | POA: Insufficient documentation

## 2013-03-08 DIAGNOSIS — F3289 Other specified depressive episodes: Secondary | ICD-10-CM | POA: Insufficient documentation

## 2013-03-08 LAB — URINALYSIS, ROUTINE W REFLEX MICROSCOPIC
Bilirubin Urine: NEGATIVE
Glucose, UA: NEGATIVE mg/dL
Hgb urine dipstick: NEGATIVE
Ketones, ur: NEGATIVE mg/dL
LEUKOCYTES UA: NEGATIVE
Nitrite: NEGATIVE
PROTEIN: NEGATIVE mg/dL
Specific Gravity, Urine: 1.015 (ref 1.005–1.030)
UROBILINOGEN UA: 0.2 mg/dL (ref 0.0–1.0)
pH: 5 (ref 5.0–8.0)

## 2013-03-08 LAB — PREGNANCY, URINE: PREG TEST UR: NEGATIVE

## 2013-03-08 MED ORDER — HYDROMORPHONE HCL PF 1 MG/ML IJ SOLN
1.0000 mg | Freq: Once | INTRAMUSCULAR | Status: AC
  Administered 2013-03-08: 1 mg via INTRAVENOUS
  Filled 2013-03-08: qty 1

## 2013-03-08 MED ORDER — KETOROLAC TROMETHAMINE 15 MG/ML IJ SOLN
30.0000 mg | Freq: Once | INTRAMUSCULAR | Status: AC
  Administered 2013-03-08: 30 mg via INTRAVENOUS
  Filled 2013-03-08: qty 2

## 2013-03-08 MED ORDER — DIAZEPAM 5 MG/ML IJ SOLN
5.0000 mg | Freq: Once | INTRAMUSCULAR | Status: AC
  Administered 2013-03-08: 5 mg via INTRAVENOUS
  Filled 2013-03-08: qty 2

## 2013-03-08 NOTE — ED Provider Notes (Signed)
CSN: [id number]     Arrival date & time 03/08/13  1222 History  This chart was scribed for non-physician practitioner Luiz Iron, PA-C, working with [name], MD, by Yevette Edwards, ED Scribe. This patient was seen in room WTR9/WTR9 and the patient's care was started at 1:44 PM.    First MD Initiated Contact with Patient 03/08/13 1337     Chief Complaint  Patient presents with  . Back Pain  . Leg Pain    l/leg pain  . Spasms    The history is provided by the patient. No language interpreter was used.   HPI Comments: Judith Hardy is a 43 y.o. female with a h/o chronic back pain, osteoarthritis, sciatica, anxiety and depression who presents to the Emergency Department complaining of constant gradually worsening back pain with associated spasms which has increased for three days. Pain is located in her lower back diffusely with radiation down her right leg. She reports a h/o chronic unchanged back pain since 2013. She states the current pain is "unbearable," and she rates the pain as 10/10. Patient recently seen in the ED on 03/04/13 for similar pain and prescribed tramadol, which temporarily improved her symptoms. Recent trama includes falling on the ice last week onto her buttocks. Patient seen in the ED after this event and her pain improved until three days ago. She denies dysuria, urinary incontinence, fever, abdominal pain, loss of sensation, weakness, or emesis. She has an appointment with her PCP tomorrow but she reports she can no longer endure the pain. She states she normally uses Dilaudid for the pain. She denies a h/o cancer and IV drug use. Hx of laminectomy in 2013.   Past Medical History  Diagnosis Date  . Anxiety   . Depression   . Arthritis   . Osteoarthritis   . Chronic back pain   . Sciatica    Past Surgical History  Procedure Laterality Date  . Appendectomy  2011  . Lumbar laminectomy/decompression microdiscectomy  01/10/2011    Procedure: LUMBAR  LAMINECTOMY/DECOMPRESSION MICRODISCECTOMY;  Surgeon: Javier Docker;  Location: [facility];  Service: Orthopedics;  Laterality: N/A;  Decompression L4 - L5  (X-Ray)  . Lumbar laminectomy/decompression microdiscectomy  08/02/2011    Procedure: LUMBAR LAMINECTOMY/DECOMPRESSION MICRODISCECTOMY;  Surgeon: Javier Docker, MD;  Location: [facility];  Service: Orthopedics;  Laterality: N/A;  Re-do Decompression L4-L5  . Back surgery     Family History  Problem Relation Age of Onset  . Heart failure Mother    History  Substance Use Topics  . Smoking status: Former Games developer  . Smokeless tobacco: Never Used  . Alcohol Use: No   No OB history provided.  Review of Systems  Constitutional: Negative for fever, chills, diaphoresis, activity change, appetite change and fatigue.  Respiratory: Negative for cough and shortness of breath.   Cardiovascular: Negative for chest pain and leg swelling.  Gastrointestinal: Negative for nausea, vomiting, abdominal pain and diarrhea.  Genitourinary: Negative for dysuria, urgency and difficulty urinating.  Musculoskeletal: Positive for back pain. Negative for gait problem, myalgias and neck pain.  Skin: Negative for wound.  Neurological: Negative for dizziness, weakness, numbness and headaches.  All other systems reviewed and are negative.   Allergies  Hydrocodone  Home Medications   Current Outpatient Rx  Name  Route  Sig  Dispense  Refill  . acetaminophen (TYLENOL) 650 MG CR tablet   Oral   Take 1,300 mg by mouth every 8 (eight) hours as needed for pain.         Marland Kitchen  Ascorbic Acid (VITAMIN C) 1000 MG tablet   Oral   Take 1,000 mg by mouth daily.         . clonazePAM (KLONOPIN) 1 MG tablet   Oral   Take 1 mg by mouth 2 (two) times daily as needed for anxiety.          . Gabapentin Enacarbil 600 MG TB24   Oral   Take 1,800 mg by mouth at bedtime.         Marland Kitchen LYSINE PO   Oral   Take 3 tablets by mouth daily.         . Magnesium 400 MG CAPS    Oral   Take 400 mg by mouth at bedtime.          . meloxicam (MOBIC) 7.5 MG tablet   Oral   Take 7.5 mg by mouth daily.         . traMADol (ULTRAM) 50 MG tablet   Oral   Take 1-2 tablets (50-100 mg total) by mouth every 6 (six) hours as needed.   20 tablet   0   . traZODone (DESYREL) 100 MG tablet   Oral   Take 100 mg by mouth at bedtime.         Marland Kitchen venlafaxine XR (EFFEXOR-XR) 150 MG 24 hr capsule   Oral   Take 300 mg by mouth daily with breakfast.          Triage Vitals: BP 127/84  Pulse 99  Temp(Src) 98.5 F (36.9 C) (Oral)  Resp 18  SpO2 100%  Filed Vitals:   03/08/13 1247 03/08/13 1629  BP: 127/84 119/58  Pulse: 99 87  Temp: 98.5 F (36.9 C)   TempSrc: Oral   Resp: 18 18  SpO2: 100% 100%    Physical Exam  Nursing note and vitals reviewed. Constitutional: She is oriented to person, place, and time. She appears well-developed and well-nourished. No distress.  Patient anxious and tearful  HENT:  Head: Normocephalic and atraumatic.  Right Ear: External ear normal.  Left Ear: External ear normal.  Nose: Nose normal.  Mouth/Throat: Oropharynx is clear and moist.  Eyes: Conjunctivae and EOM are normal. Right eye exhibits no discharge. Left eye exhibits no discharge.  Neck: Normal range of motion. Neck supple. No tracheal deviation present.  Cardiovascular: Normal rate, regular rhythm, normal heart sounds and intact distal pulses.  Exam reveals no gallop and no friction rub.   No murmur heard. Dorsalis pedis pulses present and equal bilaterally  Pulmonary/Chest: Effort normal and breath sounds normal. No respiratory distress. She has no wheezes. She has no rales. She exhibits no tenderness.  Abdominal: Soft. She exhibits no distension. There is no tenderness.  Musculoskeletal: Normal range of motion. She exhibits tenderness. She exhibits no edema.  Diffuse tenderness to palpation to the lumbar spine and paraspinal muscles throughout. No thoracic spinal  tenderness. Unable to assess strength due to pain. Patient able to ambulate with antalgic gait and without assistance.   Neurological: She is alert and oriented to person, place, and time.  Patellar reflexes intact bilaterally. Gross sensation intact in the LE bilaterally  Skin: Skin is warm and dry. She is not diaphoretic.  Psychiatric: She has a normal mood and affect. Her behavior is normal.    ED Course  Procedures (including critical care time)  DIAGNOSTIC STUDIES: Oxygen Saturation is 100% on room air, normal by my interpretation.     Labs Review Labs Reviewed  URINALYSIS, ROUTINE W REFLEX  MICROSCOPIC  PREGNANCY, URINE   Imaging Review No results found.   EKG Interpretation None      Results for orders placed during the hospital encounter of 03/08/13  URINALYSIS, ROUTINE W REFLEX MICROSCOPIC      Result Value Ref Range   Color, Urine YELLOW  YELLOW   APPearance CLEAR  CLEAR   Specific Gravity, Urine 1.015  1.005 - 1.030   pH 5.0  5.0 - 8.0   Glucose, UA NEGATIVE  NEGATIVE mg/dL   Hgb urine dipstick NEGATIVE  NEGATIVE   Bilirubin Urine NEGATIVE  NEGATIVE   Ketones, ur NEGATIVE  NEGATIVE mg/dL   Protein, ur NEGATIVE  NEGATIVE mg/dL   Urobilinogen, UA 0.2  0.0 - 1.0 mg/dL   Nitrite NEGATIVE  NEGATIVE   Leukocytes, UA NEGATIVE  NEGATIVE  PREGNANCY, URINE      Result Value Ref Range   Preg Test, Ur NEGATIVE  NEGATIVE    MDM   Naylene Fayrene FearingJames is a 43 y.o. female with a h/o chronic back pain, osteoarthritis, sciatica, anxiety and depression who presents to the Emergency Department complaining of constant gradually worsening back pain with associated spasms which has increased for three days.   Rechecks  4:00 PM = Dilaudid "took the edge off" but is still rated a 9/10.  Ordering Valium.  4:30 PM = Pain improving. Feels more comfortable. Appears less anxious. Will order Toradol before discharge.    Patient evaluated for unchanged chronic lower back pain. Patient  neurovascularly intact. Had a fall a week ago. Did not feel x-rays were indicated at this time. Patient afebrile and non-toxic in appearance. Had improvements in her pain throughout her ED visit. No concerning signs or symptoms of cauda equina or epidural abscess. UA negative for UTI. Patient has appointment with PCP tomorrow for further evaluation and management. Return precautions, discharge instructions, and follow-up was discussed with the patient before discharge.     Discharge Medication List as of 03/08/2013  4:30 PM       Final impressions: 1. Chronic lower back pain       Greer EeJessica Katlin Daisia Slomski PA-C            Jillyn LedgerJessica K Nitara Szczerba, [place] JerseyPA-C 03/10/13 1105

## 2013-03-08 NOTE — Discharge Instructions (Signed)
- Return to the emergency department if you develop any changing/worsening condition, loss of bowel/bladder function, weakness, loss of sensation, or any other concerns (please read additional information regarding your condition below) - Follow-up at your scheduled appointment tomorrow    Back Pain, Adult Low back pain is very common. About 1 in 5 people have back pain.The cause of low back pain is rarely dangerous. The pain often gets better over time.About half of people with a sudden onset of back pain feel better in just 2 weeks. About 8 in 10 people feel better by 6 weeks.  CAUSES Some common causes of back pain include:  Strain of the muscles or ligaments supporting the spine.  Wear and tear (degeneration) of the spinal discs.  Arthritis.  Direct injury to the back. DIAGNOSIS Most of the time, the direct cause of low back pain is not known.However, back pain can be treated effectively even when the exact cause of the pain is unknown.Answering your caregiver's questions about your overall health and symptoms is one of the most accurate ways to make sure the cause of your pain is not dangerous. If your caregiver needs more information, he or she may order lab work or imaging tests (X-rays or MRIs).However, even if imaging tests show changes in your back, this usually does not require surgery. HOME CARE INSTRUCTIONS For many people, back pain returns.Since low back pain is rarely dangerous, it is often a condition that people can learn to [facility] their own.   Remain active. It is stressful on the back to sit or stand in one place. Do not sit, drive, or stand in one place for more than 30 minutes at a time. Take short walks on level surfaces as soon as pain allows.Try to increase the length of time you walk each day.  Do not stay in bed.Resting more than 1 or 2 days can delay your recovery.  Do not avoid exercise or work.Your body is made to move.It is not dangerous to be  active, even though your back may hurt.Your back will likely heal faster if you return to being active before your pain is gone.  Pay attention to your body when you bend and lift. Many people have less discomfortwhen lifting if they bend their knees, keep the load close to their bodies,and avoid twisting. Often, the most comfortable positions are those that put less stress on your recovering back.  Find a comfortable position to sleep. Use a firm mattress and lie on your side with your knees slightly bent. If you lie on your back, put a pillow under your knees.  Only take over-the-counter or prescription medicines as directed by your caregiver. Over-the-counter medicines to reduce pain and inflammation are often the most helpful.Your caregiver may prescribe muscle relaxant drugs.These medicines help dull your pain so you can more quickly return to your normal activities and healthy exercise.  Put ice on the injured area.  Put ice in a plastic bag.  Place a towel between your skin and the bag.  Leave the ice on for 15-20 minutes, 03-04 times a day for the first 2 to 3 days. After that, ice and heat may be alternated to reduce pain and spasms.  Ask your caregiver about trying back exercises and gentle massage. This may be of some benefit.  Avoid feeling anxious or stressed.Stress increases muscle tension and can worsen back pain.It is important to recognize when you are anxious or stressed and learn ways to manage it.Exercise is a  great option. SEEK MEDICAL CARE IF:  You have pain that is not relieved with rest or medicine.  You have pain that does not improve in 1 week.  You have new symptoms.  You are generally not feeling well. SEEK IMMEDIATE MEDICAL CARE IF:   You have pain that radiates from your back into your legs.  You develop new bowel or bladder control problems.  You have unusual weakness or numbness in your arms or legs.  You develop nausea or vomiting.  You  develop abdominal pain.  You feel faint. Document Released: 12/25/2004 Document Revised: 06/26/2011 Document Reviewed: 05/15/2010 [facility] Patient Information 2014 [place].   Emergency Department Resource Guide 1) Find a Doctor and Pay Out of Pocket Although you won't have to find out who is covered by your insurance plan, it is a good idea to ask around and get recommendations. You will then need to call the office and see if the doctor you have chosen will accept you as a new patient and what types of options they offer for patients who are self-pay. Some doctors offer discounts or will set up payment plans for their patients who do not have insurance, but you will need to ask so you aren't surprised when you get to your appointment.  2) Contact Your Local Health Department Not all health departments have doctors that can see patients for sick visits, but many do, so it is worth a call to see if yours does. If you don't know where your local health department is, you can check in your phone book. The CDC also has a tool to help you locate your state's health department, and many state websites also have listings of all of their local health departments.  3) Find a Walk-in Clinic If your illness is not likely to be very severe or complicated, you may want to try a walk in clinic. These are popping up all over the country in pharmacies, drugstores, and shopping centers. They're usually staffed by nurse practitioners or physician assistants that have been trained to treat common illnesses and complaints. They're usually fairly quick and inexpensive. However, if you have serious medical issues or chronic medical problems, these are probably not your best option.  No Primary Care Doctor: - Call Health Connect at  [phone] - they can help you locate a primary care doctor that  accepts your insurance, provides certain services, etc. - Physician Referral Service- [phone]  Chronic Pain  Problems: Organization         Address  Phone   Notes  [facility]  [phone] Patients need to be referred by their primary care doctor.   Medication Assistance: Organization         Address  Phone   Notes  [facility] [address] [place], [place] [id number] [phone] --Must be a resident of [place] [place] -- Must have NO insurance coverage whatsoever (no Medicaid/ Medicare, etc.) -- The pt. MUST have a primary care doctor that directs their care regularly and follows them in the community   [facility]ist  [phone]   [employer]  [phone]    Agencies that provide inexpensive medical care: Organization         Address  Phone   Notes  [facility]  [phone]   [facility]    [phone]   [facility] [address] [place], [place]  [address] [phone]   [facility] [address], [place] [phone]   Planned Parenthood    [phone]   [facility]    [phone]   [facility]  [address], [place] Phone:  [phone], Fax:  [phone] Hours of Operation:  9 am - 6 pm, M-F.  Also accepts Medicaid/Medicare and self-pay.  [facility]  [address], [place] Phone: [phone], Fax: [phone]. Hours of Operation:  8:30 am - 5:30 pm, M-F.  Also accepts Medicaid and self-pay.  [facility] [place] [address] [place] Phone: [phone]   [facility] [address] Natasha Bence [place], [place] [phone], Ext. [phone] Mondays & Thursdays: 7-9 AM.  First 15 patients are seen on a first come, first serve basis.    Medicaid-accepting [facility] Providers:  Organization         Address  Phone   Notes  [facility] [address], [place] [phone] Also  accepts self-pay patients.  [facility] [address] Laurell Josephs [place], [place]  [phone]   [facility] [address], [place] [phone]   [facility] [address] [place] [phone]   Renaye Rakers [address], [place]   [phone] Only accepts [place] Access [place] patients after they have their name applied to their card.   Self-Pay (no insurance) in [facility]:  Organization         Address  Phone   Notes  Sickle Cell Patients, [facility] [address] [place], [place] [phone]   [facility] [address] [place], [place] [phone]   [facility]  [address], [place] [phone]   [facility]/Dr. Osei-Bonsu  [address], [place] or [address], [place] [phone] Phone number for both [place] and [place] locations is the same.  [facility]gent [facility] and [facility] [address], [place] [phone]   [facility] [address] [place] or [address] [phone]   [facility] [address], [place] [phone], phone; [phone], fax Sees patients 1st and 3rd Saturday of every month.  Must not qualify for public or private insurance (i.e. Medicaid, Medicare, [place] Health Choice, Veterans' Benefits)  Household income should be no more than 200% of the poverty level The clinic cannot treat you if you are pregnant or think you are pregnant  Sexually transmitted diseases are not treated at the clinic.    Dental Care: Organization         Address  Phone  Notes  [facility] Department of [facility] [address] [place] [address] [place], [place] [phone] Accepts children up to age 63 who are enrolled in [place] or [place] Health Choice; pregnant  women with a Medicaid card; and children who have applied for Medicaid or [place] Health Choice, but were declined, whose parents can pay a reduced fee at time of service.  [facility] Department of [facility]  [address], [place] [phone] Accepts children up to age 54 who are enrolled in [place] or [place] Health Choice; pregnant women  with a Medicaid card; and children who have applied for Medicaid or [place] Health Choice, but were declined, whose parents can pay a reduced fee at time of service.  [facility]  [address] [place], [place] [phone] Patients are seen by appointment only. Walk-ins are not accepted. [facility] will see patients 26 years of age and older. Monday - Tuesday (8am-5pm) Most Wednesdays (8:30-5pm) $30 per visit, cash only  [facility] Access [facility]  [address], [facility] [phone] Patients are seen by appointment only. Walk-ins are not accepted. [facility] will see patients 31 years of age and older. One Wednesday Evening (Monthly: Volunteer Based).  $30 per visit, cash only  [employer]  [phone] for adults; Children under age 10, call Graduate Pediatric Dentistry at [phone]. Children aged 55-14, please call [phone] to request a pediatric application.  Dental services are provided in all areas of dental care including fillings, crowns and bridges, complete and partial dentures, implants, gum treatment, root canals, and extractions. Preventive care is also provided. Treatment is provided to both adults and children. Patients are selected via a lottery and there is often a waiting list.   [facility] [address] [place]  [phone] [email or web address]   [facility] [address] [place], [place] [phone], Ext. [phone] Second and Fourth Thursday of each month, opens at 6:30 AM; Clinic ends at 9 AM.  Patients are  seen on a first-come first-served basis, and a limited number are seen during each clinic.   [facility]  [address] Ether Griffins [place], [place] [phone]   Eligibility Requirements You must have lived in [place], [place], or [place] counties for at least the last three months.   You cannot be eligible for state or federal sponsored [place], including [employer], [place], or [employer].   You generally cannot be eligible for healthcare insurance through your employer.    How to apply: Eligibility screenings are held every Tuesday and Wednesday afternoon from 1:00 pm until 4:00 pm. You do not need an appointment for the interview!  [facility] [address] [place], [place] [phone]   [facility]  [phone]   [facility] Health Department  [phone]   [facility] Health Department  [phone]    Behavioral Health Resources in the Community: Intensive Outpatient Programs Organization         Address  Phone  Notes  [facility] Services [address] [place], [place] [phone]   [facility] Outpatient [address] [place], [place] [phone]   [facility] [address], [place], [place]  [phone]   [facility] Mental Health [address],  [place], [place] [phone] or [phone]   Substance Abuse Resources Organization         Address  Phone  Notes  [facility] Drug Services  [phone]   [facility]  [phone]   [facility] [place]  [phone]   Floydene Flock  [phone]   Residential & Outpatient Substance Abuse Program  [phone]   Psychological Services Organization         Address  Phone  Notes  [facility] Health  [phone]   [facility] Services  [phone]   [facility] [address] [place] [phone] or [phone]    Mobile Crisis  Teams  Organization         Address  Phone  Notes  [facility], Mobile Crisis Care Unit  [phone]   [facility]  [address] [place], [place] [phone]   [facility] [address] [place] [phone]    Self-Help/Support Groups Organization         Address  Phone             Notes  [facility] [place] - variety of support groups  336- I7437963 Call for more information  Narcotics Anonymous (NA), [facility] [address], [employer] [place]  2 meetings at this location   Statistician         Address  Phone  Notes  [facility] [address],    [place]  [phone]   [facility]  [address] [place] [address], [place], [place] [phone]   [facility] [address] [place], [place] [phone] Admissions: 8am-3pm M-F  [facility] [address],    [place], [place] [phone]   [facility] [address] [place], [place], [place] [phone]   [facility] [address]  [place], [place] [phone]   [facility] - Intensive Outpatient [address] Laurell Josephs [address], [place], [place] [phone]   [facility] ([facility] [address] [place]  [place], [place] [phone] or [phone]   [facility] ([facility]) [address], [place], [place] [phone] Accepts Medicaid  [facility] [address],  [place] [phone] Substance Abuse/Addiction Treatment   [facility] Organization         Address  Phone  Notes  [facility]  [phone]   Angie Fava, PhD [address] Ervin Knack [place], [place]   ([phone] or [phone]   [facility] Behavioral   [address] [place], [place] [phone]   [facility] [address] [place], [place] [phone]  Insurance/Medicaid/sponsorship through [facility] and Families [address]                                    [place], [place] [phone] Therapy/tele-psych/case  [facility] [address], [place] [phone]    Dr. Lolly Mustache  [phone]   [facility] [place]  United Way [facility] Dept. 1) [address], [place] 2) [address] [place] 3)  [address], [place] [phone]  [phone]   [facility] Child Abuse Hotline (217) 225-8136 or [phone])[phone] (After Hours)

## 2013-03-08 NOTE — ED Notes (Signed)
Pt reports increased back pain and muscles spasms x 3 days. Pain unresponsive to prescriptions or heat. TENS unit not effective. Pt alert, oriented and cooperative.  Pt stated that she is scheduled to be [facility]. Pt reports anxiety attack this am due to pain

## 2013-03-11 NOTE — ED Provider Notes (Signed)
Medical screening examination/treatment/procedure(s) were performed by non-physician practitioner and as supervising physician I was immediately available for consultation/collaboration.   EKG Interpretation None        Lyanne CoKevin M Tajuana Kniskern, MD 03/11/13 1319

## 2013-03-29 ENCOUNTER — Emergency Department (HOSPITAL_COMMUNITY): Payer: PRIVATE HEALTH INSURANCE

## 2013-03-29 ENCOUNTER — Encounter (HOSPITAL_COMMUNITY): Payer: Self-pay | Admitting: Emergency Medicine

## 2013-03-29 ENCOUNTER — Emergency Department (HOSPITAL_COMMUNITY)
Admission: EM | Admit: 2013-03-29 | Discharge: 2013-03-29 | Discharge status: Against Medical Advice | Payer: PRIVATE HEALTH INSURANCE | Attending: Emergency Medicine | Admitting: Emergency Medicine

## 2013-03-29 DIAGNOSIS — IMO0002 Reserved for concepts with insufficient information to code with codable children: Secondary | ICD-10-CM | POA: Insufficient documentation

## 2013-03-29 DIAGNOSIS — Y9389 Activity, other specified: Secondary | ICD-10-CM | POA: Insufficient documentation

## 2013-03-29 DIAGNOSIS — Y929 Unspecified place or not applicable: Secondary | ICD-10-CM | POA: Insufficient documentation

## 2013-03-29 DIAGNOSIS — M549 Dorsalgia, unspecified: Secondary | ICD-10-CM

## 2013-03-29 DIAGNOSIS — W010XXA Fall on same level from slipping, tripping and stumbling without subsequent striking against object, initial encounter: Secondary | ICD-10-CM | POA: Insufficient documentation

## 2013-03-29 DIAGNOSIS — Z9889 Other specified postprocedural states: Secondary | ICD-10-CM | POA: Insufficient documentation

## 2013-03-29 DIAGNOSIS — W19XXXA Unspecified fall, initial encounter: Secondary | ICD-10-CM

## 2013-03-29 DIAGNOSIS — G8929 Other chronic pain: Secondary | ICD-10-CM

## 2013-03-29 NOTE — ED Notes (Signed)
Pt observed by an employee, leaving the department via lobby.  Did not return to room.

## 2013-03-29 NOTE — ED Notes (Signed)
She states she slipped/fell yesterday, landing on her buttocks.  She c/o low back pain radiating into both legs.  She further tells me she has chronic low back pain r/t two back surgeries.

## 2013-03-29 NOTE — ED Provider Notes (Signed)
Patient left the ED before this provider could interview or examine/assess the patient. Patient left without notifying staff. This provider did not take part in any medical decision making for this patient. Patient left without being seen.   Raymon MuttonMarissa Venezia Sargeant, PA-C 03/29/13 2124

## 2013-03-30 ENCOUNTER — Emergency Department (HOSPITAL_COMMUNITY)
Admission: EM | Admit: 2013-03-30 | Discharge: 2013-03-30 | Disposition: A | Discharge status: Home / Self Care | Payer: PRIVATE HEALTH INSURANCE | Attending: Emergency Medicine | Admitting: Emergency Medicine

## 2013-03-30 ENCOUNTER — Encounter (HOSPITAL_COMMUNITY): Payer: Self-pay | Admitting: Emergency Medicine

## 2013-03-30 ENCOUNTER — Emergency Department (HOSPITAL_COMMUNITY): Payer: PRIVATE HEALTH INSURANCE

## 2013-03-30 DIAGNOSIS — R296 Repeated falls: Secondary | ICD-10-CM | POA: Insufficient documentation

## 2013-03-30 DIAGNOSIS — F3289 Other specified depressive episodes: Secondary | ICD-10-CM | POA: Insufficient documentation

## 2013-03-30 DIAGNOSIS — F329 Major depressive disorder, single episode, unspecified: Secondary | ICD-10-CM | POA: Insufficient documentation

## 2013-03-30 DIAGNOSIS — F411 Generalized anxiety disorder: Secondary | ICD-10-CM | POA: Insufficient documentation

## 2013-03-30 DIAGNOSIS — Z79899 Other long term (current) drug therapy: Secondary | ICD-10-CM | POA: Insufficient documentation

## 2013-03-30 DIAGNOSIS — Z791 Long term (current) use of non-steroidal anti-inflammatories (NSAID): Secondary | ICD-10-CM | POA: Insufficient documentation

## 2013-03-30 DIAGNOSIS — Y929 Unspecified place or not applicable: Secondary | ICD-10-CM | POA: Insufficient documentation

## 2013-03-30 DIAGNOSIS — G8929 Other chronic pain: Secondary | ICD-10-CM | POA: Insufficient documentation

## 2013-03-30 DIAGNOSIS — Z87891 Personal history of nicotine dependence: Secondary | ICD-10-CM | POA: Insufficient documentation

## 2013-03-30 DIAGNOSIS — Y9301 Activity, walking, marching and hiking: Secondary | ICD-10-CM | POA: Insufficient documentation

## 2013-03-30 DIAGNOSIS — IMO0002 Reserved for concepts with insufficient information to code with codable children: Secondary | ICD-10-CM | POA: Insufficient documentation

## 2013-03-30 DIAGNOSIS — M1990 Unspecified osteoarthritis, unspecified site: Secondary | ICD-10-CM | POA: Insufficient documentation

## 2013-03-30 DIAGNOSIS — M549 Dorsalgia, unspecified: Secondary | ICD-10-CM

## 2013-03-30 MED ORDER — NAPROXEN 500 MG PO TABS: 500.0000 mg | ORAL_TABLET | Freq: Two times a day (BID) | ORAL | Status: DC

## 2013-03-30 MED ORDER — HYDROMORPHONE HCL PF 1 MG/ML IJ SOLN
1.0000 mg | INTRAMUSCULAR | Status: AC | PRN
  Administered 2013-03-30 (×2): 1 mg via INTRAVENOUS
  Filled 2013-03-30 (×2): qty 1

## 2013-03-30 MED ORDER — DIAZEPAM 5 MG PO TABS: 5.0000 mg | ORAL_TABLET | Freq: Three times a day (TID) | ORAL | Status: DC | PRN

## 2013-03-30 MED ORDER — KETOROLAC TROMETHAMINE 30 MG/ML IJ SOLN
30.0000 mg | Freq: Once | INTRAMUSCULAR | Status: DC
  Filled 2013-03-30: qty 1

## 2013-03-30 MED ORDER — DIAZEPAM 5 MG/ML IJ SOLN
5.0000 mg | INTRAMUSCULAR | Status: DC | PRN
  Administered 2013-03-30: 5 mg via INTRAVENOUS
  Filled 2013-03-30: qty 2

## 2013-03-30 MED ORDER — ONDANSETRON HCL 4 MG/2ML IJ SOLN
4.0000 mg | Freq: Once | INTRAMUSCULAR | Status: DC
  Filled 2013-03-30: qty 2

## 2013-03-30 MED ORDER — OXYCODONE-ACETAMINOPHEN 5-325 MG PO TABS: 2.0000 | ORAL_TABLET | ORAL | Status: DC | PRN

## 2013-03-30 MED ORDER — METHOCARBAMOL 500 MG PO TABS: 500.0000 mg | ORAL_TABLET | Freq: Two times a day (BID) | ORAL | Status: DC

## 2013-03-30 NOTE — ED Provider Notes (Signed)
Medical screening examination/treatment/procedure(s) were performed by non-physician practitioner and as supervising physician I was immediately available for consultation/collaboration.   EKG Interpretation None        Aynsley Fleet M Sherin Murdoch, DO 03/30/13 2112 

## 2013-03-30 NOTE — ED Notes (Signed)
Pt removed head blocks and c collar.  Pt rolled herself off the back board to lay on her side.  Back board taken off bed.

## 2013-03-30 NOTE — ED Notes (Signed)
Bed: WA20 Expected date:  Expected time:  Means of arrival:  Comments: ems- fall immobilized

## 2013-03-30 NOTE — ED Notes (Signed)
Pt refused to sign discharge form until she gets a prescription for Valium

## 2013-03-30 NOTE — ED Notes (Signed)
Patient transported to X-ray 

## 2013-03-30 NOTE — ED Provider Notes (Signed)
CSN: [id number]     Arrival date & time 03/30/13  1206 History   First MD Initiated Contact with Patient 03/30/13 1250     Chief Complaint  Patient presents with  . Fall  . Back Pain      HPI  Patient presents with back pain. Has a history of chronic back pain. States she fell hurt her back yesterday. Since she came in last night  And "I sat here for 2 hours and no tended to me so I left". She's she went home. She fell again she fell down to the top of the stairs. Did not roll down the stairs complains of pain in her left back and back. Chronic. States "I only take Tylenol".    Past Medical History  Diagnosis Date  . Anxiety   . Depression   . Arthritis   . Osteoarthritis   . Chronic back pain   . Sciatica    Past Surgical History  Procedure Laterality Date  . Appendectomy  2011  . Lumbar laminectomy/decompression microdiscectomy  01/10/2011    Procedure: LUMBAR LAMINECTOMY/DECOMPRESSION MICRODISCECTOMY;  Surgeon: [name];  Location: [facility];  Service: Orthopedics;  Laterality: N/A;  Decompression L4 - L5  (X-Ray)  . Lumbar laminectomy/decompression microdiscectomy  08/02/2011    Procedure: LUMBAR LAMINECTOMY/DECOMPRESSION MICRODISCECTOMY;  Surgeon: [name], MD;  Location: [facility];  Service: Orthopedics;  Laterality: N/A;  Re-do Decompression L4-L5  . Back surgery     Family History  Problem Relation Age of Onset  . Heart failure Mother    History  Substance Use Topics  . Smoking status: Former Games developermoker  . Smokeless tobacco: Never Used  . Alcohol Use: No   OB History   Grav Para Term Preterm Abortions TAB SAB Ect Mult Living                 Review of Systems  Constitutional: Negative for fever, chills, diaphoresis, appetite change and fatigue.  HENT: Negative for mouth sores, sore throat and trouble swallowing.   Eyes: Negative for visual disturbance.  Respiratory: Negative for cough, chest tightness, shortness of breath and wheezing.   Cardiovascular:  Negative for chest pain.  Gastrointestinal: Negative for nausea, vomiting, abdominal pain, diarrhea and abdominal distention.  Endocrine: Negative for polydipsia, polyphagia and polyuria.  Genitourinary: Negative for dysuria, frequency and hematuria.  Musculoskeletal: Negative for gait problem.  Skin: Negative for color change, pallor and rash.  Neurological: Negative for dizziness, syncope, light-headedness and headaches.  Hematological: Does not bruise/bleed easily.  Psychiatric/Behavioral: Negative for behavioral problems and confusion.      Allergies  Hydrocodone  Home Medications   Current Outpatient Rx  Name  Route  Sig  Dispense  Refill  . acetaminophen (TYLENOL) 650 MG CR tablet   Oral   Take 1,300 mg by mouth every 8 (eight) hours as needed for pain.         . Ascorbic Acid (VITAMIN C) 1000 MG tablet   Oral   Take 1,000 mg by mouth daily.         . clonazePAM (KLONOPIN) 1 MG tablet   Oral   Take 1 mg by mouth 2 (two) times daily as needed for anxiety.          . Gabapentin Enacarbil 600 MG TB24   Oral   Take 1,800 mg by mouth at bedtime.         Marland Kitchen. LYSINE PO   Oral   Take 3 tablets by mouth  daily.         . Magnesium 400 MG CAPS   Oral   Take 400 mg by mouth at bedtime.          . meloxicam (MOBIC) 7.5 MG tablet   Oral   Take 7.5 mg by mouth daily.         . traZODone (DESYREL) 100 MG tablet   Oral   Take 100 mg by mouth at bedtime.         Marland Kitchen venlafaxine XR (EFFEXOR-XR) 150 MG 24 hr capsule   Oral   Take 300 mg by mouth daily with breakfast.          BP 139/55  Pulse 86  Temp(Src) 98 F (36.7 C) (Oral)  Resp 18  Ht 5\' 8"  (1.727 m)  Wt 230 lb (104.327 kg)  BMI 34.98 kg/m2  SpO2 100% Physical Exam  Musculoskeletal:       Back:    ED Course  Procedures (including critical care time) Labs Review Labs Reviewed - No data to display Imaging Review Dg Thoracic Spine 2 View  03/30/2013   CLINICAL DATA:  Pain post fall.   EXAM: THORACIC SPINE - 2 VIEW  COMPARISON:  Chest x-ray 12/01/2011  FINDINGS: Vertebral body alignment, heights and disc spaces are within normal. There is minimal spondylosis over the lower thoracic spine. There is no acute compression fracture or subluxation.  IMPRESSION: No acute findings.  Minimal spondylosis.   Electronically Signed   By: Elberta Fortis M.D.   On: 03/30/2013 14:48   Dg Lumbar Spine Complete  03/30/2013   CLINICAL DATA:  Back pain post fall.  EXAM: LUMBAR SPINE - COMPLETE 4+ VIEW  COMPARISON:  12/08/2012  FINDINGS: Vertebral body alignment, heights and disc space heights are within normal. There is mild spondylosis present to include facet arthropathy over the lower lumbar spine. There is no compression fracture or subluxation. IUD is present over the midline pelvis.  IMPRESSION: Minimal spondylosis.  No acute findings.   Electronically Signed   By: Elberta Fortis M.D.   On: 03/30/2013 14:47     EKG Interpretation None      MDM   Final diagnoses:  Chronic back pain    No acute fractures. No findings or symptoms of suggested herniated nucleus. Normal continence and sensation. Plan is symptom control at home.    Rolland Porter, MD 03/30/13 [phone]

## 2013-03-30 NOTE — ED Notes (Signed)
Pt fell while walking and injured back.  Pt ule attempted to prevent fall. Pt c/o mid back pain.  Denies LOC.

## 2013-04-14 ENCOUNTER — Emergency Department (HOSPITAL_COMMUNITY): Payer: PRIVATE HEALTH INSURANCE

## 2013-04-14 ENCOUNTER — Emergency Department (HOSPITAL_COMMUNITY)
Admission: EM | Admit: 2013-04-14 | Discharge: 2013-04-14 | Disposition: A | Discharge status: Home / Self Care | Payer: PRIVATE HEALTH INSURANCE | Attending: Emergency Medicine | Admitting: Emergency Medicine

## 2013-04-14 ENCOUNTER — Encounter (HOSPITAL_COMMUNITY): Payer: Self-pay | Admitting: Emergency Medicine

## 2013-04-14 DIAGNOSIS — F329 Major depressive disorder, single episode, unspecified: Secondary | ICD-10-CM | POA: Insufficient documentation

## 2013-04-14 DIAGNOSIS — IMO0002 Reserved for concepts with insufficient information to code with codable children: Secondary | ICD-10-CM | POA: Insufficient documentation

## 2013-04-14 DIAGNOSIS — Y929 Unspecified place or not applicable: Secondary | ICD-10-CM | POA: Insufficient documentation

## 2013-04-14 DIAGNOSIS — Z79899 Other long term (current) drug therapy: Secondary | ICD-10-CM | POA: Insufficient documentation

## 2013-04-14 DIAGNOSIS — G8929 Other chronic pain: Secondary | ICD-10-CM | POA: Insufficient documentation

## 2013-04-14 DIAGNOSIS — Z9889 Other specified postprocedural states: Secondary | ICD-10-CM | POA: Insufficient documentation

## 2013-04-14 DIAGNOSIS — F3289 Other specified depressive episodes: Secondary | ICD-10-CM | POA: Insufficient documentation

## 2013-04-14 DIAGNOSIS — Z791 Long term (current) use of non-steroidal anti-inflammatories (NSAID): Secondary | ICD-10-CM | POA: Insufficient documentation

## 2013-04-14 DIAGNOSIS — F411 Generalized anxiety disorder: Secondary | ICD-10-CM | POA: Insufficient documentation

## 2013-04-14 DIAGNOSIS — M549 Dorsalgia, unspecified: Secondary | ICD-10-CM

## 2013-04-14 DIAGNOSIS — M1990 Unspecified osteoarthritis, unspecified site: Secondary | ICD-10-CM | POA: Insufficient documentation

## 2013-04-14 DIAGNOSIS — Z87891 Personal history of nicotine dependence: Secondary | ICD-10-CM | POA: Insufficient documentation

## 2013-04-14 DIAGNOSIS — Y939 Activity, unspecified: Secondary | ICD-10-CM | POA: Insufficient documentation

## 2013-04-14 DIAGNOSIS — R296 Repeated falls: Secondary | ICD-10-CM | POA: Insufficient documentation

## 2013-04-14 MED ORDER — METHOCARBAMOL 500 MG PO TABS
1000.0000 mg | ORAL_TABLET | Freq: Once | ORAL | Status: AC
  Administered 2013-04-14: 1000 mg via ORAL
  Filled 2013-04-14: qty 2

## 2013-04-14 MED ORDER — OXYCODONE-ACETAMINOPHEN 5-325 MG PO TABS
1.0000 | ORAL_TABLET | Freq: Once | ORAL | Status: AC
  Administered 2013-04-14: 1 via ORAL
  Filled 2013-04-14: qty 1

## 2013-04-14 MED ORDER — METHOCARBAMOL 500 MG PO TABS: 1000.0000 mg | ORAL_TABLET | Freq: Four times a day (QID) | ORAL | Status: DC

## 2013-04-14 NOTE — ED Provider Notes (Signed)
Medical screening examination/treatment/procedure(s) were performed by non-physician practitioner and as supervising physician I was immediately available for consultation/collaboration.   Celene KrasJon R Cleotha Tsang, MD 04/14/13 [phone]

## 2013-04-14 NOTE — ED Notes (Signed)
Pt reports lower back pain that radiates down L leg. Sts she fell this am 2/2 sciatica pain making her leg go numb. Fell on left side onto kitchen floor. Sts she is waiting for referrals from PCP to pain management to be completed. She takes multiple pain and muscle relaxant meds at home. Sts she took her last dilaudid a week ago.

## 2013-04-14 NOTE — ED Provider Notes (Signed)
CSN: [id number]     Arrival date & time 04/14/13  1211 History  This chart was scribed for Rhea Bleacher, PA working with Celene Kras, MD by Quintella Reichert, ED Scribe. This patient was seen in room WTR5/WTR5 and the patient's care was started at 12:20 PM.   Chief Complaint  Patient presents with  . Back Pain    The history is provided by the patient and medical records. No language interpreter was used.    HPI Comments: Judith Hardy is a 43 y.o. female with h/o chronic back pain and 2 back surgeries who presents to the Emergency Department complaining of a fall that occurred 1-2 hours ago.  Pt states she fell because her sciatic nerve pain was flaring up.  She landed on her left side.  Since then she has had severe pain to her left lower back radiating into her left buttock.  She states this is similar to her chronic pain but more severe.  She has used ice, heat, and Tylenol, without relief.  She also regularly takes gabapentin, meloxicam, Effexor, and trazadone which she has been taking without relief.  She states she last received a prescription for Dilaudid one month ago but medical records reveal that she had 42 tablets of Dilaudid filled on 3/31.  Pt states she is attempting to get into pain management.  Review of [facility] records show PCP appt 04/07/2013. Prior to this she was seen at pain management and decision was made to ween patient off PO Dilaudid. Concern at 3/31 was that she was using her medication inappropriately (doubling up). They gave the above prescription noting they would continue to ween down dilaudid. Meanwhile, she is trying to get into a new pain management clinic.   Also of note, patient had visit for fall on stairs 2 weeks ago.    Patient Active Problem List   Diagnosis Date Noted  . HNP (herniated nucleus pulposus), lumbar 08/02/2011    Past Medical History  Diagnosis Date  . Anxiety   . Depression   . Arthritis   . Osteoarthritis   . Chronic back pain   .  Sciatica     Past Surgical History  Procedure Laterality Date  . Appendectomy  2011  . Lumbar laminectomy/decompression microdiscectomy  01/10/2011    Procedure: LUMBAR LAMINECTOMY/DECOMPRESSION MICRODISCECTOMY;  Surgeon: Javier Docker;  Location: [facility];  Service: Orthopedics;  Laterality: N/A;  Decompression L4 - L5  (X-Ray)  . Lumbar laminectomy/decompression microdiscectomy  08/02/2011    Procedure: LUMBAR LAMINECTOMY/DECOMPRESSION MICRODISCECTOMY;  Surgeon: Javier Docker, MD;  Location: [facility];  Service: Orthopedics;  Laterality: N/A;  Re-do Decompression L4-L5  . Back surgery      Family History  Problem Relation Age of Onset  . Heart failure Mother     History  Substance Use Topics  . Smoking status: Former Games developer  . Smokeless tobacco: Never Used  . Alcohol Use: No    OB History   Grav Para Term Preterm Abortions TAB SAB Ect Mult Living                   Review of Systems  Constitutional: Negative for fever and unexpected weight change.  Gastrointestinal: Negative for constipation.       Negative for fecal incontinence.   Genitourinary: Negative for dysuria, hematuria, flank pain, vaginal bleeding, vaginal discharge and pelvic pain.       Negative for urinary incontinence or retention.  Musculoskeletal: Positive for back pain.  Neurological:  Negative for weakness and numbness.       Denies saddle paresthesias.      Allergies  Hydrocodone  Home Medications   Current Outpatient Rx  Name  Route  Sig  Dispense  Refill  . acetaminophen (TYLENOL) 650 MG CR tablet   Oral   Take 1,300 mg by mouth every 8 (eight) hours as needed for pain.         . clonazePAM (KLONOPIN) 1 MG tablet   Oral   Take 1 mg by mouth 2 (two) times daily as needed for anxiety.          . diazepam (VALIUM) 5 MG tablet   Oral   Take 1 tablet (5 mg total) by mouth every 8 (eight) hours as needed (muscle spasm).   15 tablet   0   . Gabapentin Enacarbil 600 MG TB24   Oral    Take 1,800 mg by mouth at bedtime.         Marland Kitchen LYSINE PO   Oral   Take 3 tablets by mouth daily.         . meloxicam (MOBIC) 7.5 MG tablet   Oral   Take 7.5 mg by mouth daily.         Marland Kitchen oxyCODONE-acetaminophen (PERCOCET/ROXICET) 5-325 MG per tablet   Oral   Take 2 tablets by mouth every 4 (four) hours as needed for severe pain.         . traZODone (DESYREL) 100 MG tablet   Oral   Take 100 mg by mouth at bedtime.         Marland Kitchen venlafaxine XR (EFFEXOR-XR) 150 MG 24 hr capsule   Oral   Take 300 mg by mouth daily with breakfast.         . methocarbamol (ROBAXIN) 500 MG tablet   Oral   Take 2 tablets (1,000 mg total) by mouth 4 (four) times daily.   20 tablet   0    BP 156/90  Pulse 79  Temp(Src) 98.1 F (36.7 C) (Oral)  Resp 18  SpO2 100%  Physical Exam  Nursing note and vitals reviewed. Constitutional: She appears well-developed and well-nourished. No distress.  HENT:  Head: Normocephalic and atraumatic.  Eyes: Conjunctivae and EOM are normal.  Neck: Normal range of motion. Neck supple. No tracheal deviation present.  Cardiovascular: Normal rate.   Pulmonary/Chest: Effort normal. No respiratory distress.  Abdominal: Soft. There is no tenderness. There is no CVA tenderness.  Musculoskeletal: Normal range of motion. She exhibits tenderness.  No midline spinal tenderness.  Left lumbar paraspinous tenderness.  Neurological: She is alert. She has normal strength and normal reflexes. No sensory deficit.  5/5 strength in entire lower extremities bilaterally. No sensation deficit.   Skin: Skin is warm and dry. No rash noted.  Psychiatric: She has a normal mood and affect. Her behavior is normal.    ED Course  Procedures (including critical care time)  DIAGNOSTIC STUDIES: Oxygen Saturation is 100% on room air, normal by my interpretation.    COORDINATION OF CARE: 12:25 PM-Discussed treatment plan which includes x-ray and pain medication with pt at bedside and pt  agreed to plan.     Labs Review Labs Reviewed - No data to display   Imaging Review Dg Lumbar Spine Complete  04/14/2013   CLINICAL DATA:  Larey Seat.  Back pain.  EXAM: LUMBAR SPINE - COMPLETE 4+ VIEW  COMPARISON:  None.  FINDINGS: There is no evidence of lumbar spine fracture. Alignment  is normal. Intervertebral disc spaces are maintained. IUD in the pelvis.  IMPRESSION: Negative.   Electronically Signed   By: Davonna BellingJohn  Curnes M.D.   On: 04/14/2013 13:05     EKG Interpretation None      Patient seen and examined. Work-up initiated. Medications ordered.   Vital signs reviewed and are as follows: Filed Vitals:   04/14/13 1336  BP: 148/90  Pulse: 80  Temp:   Resp: 16   Pt informed of x-ray results. She states some improvement with robaxin. I declined narcotics for home. Referred her to pain management clinic/PCP.   No red flag s/s of low back pain. Patient was counseled on back pain precautions and told to do activity as tolerated but do not lift, push, or pull heavy objects more than 10 pounds for the next week.  Patient counseled to use ice or heat on back for no longer than 15 minutes every hour.   Patient prescribed muscle relaxer and counseled on proper use of muscle relaxant medication.    Urged patient not to drink alcohol, drive, or perform any other activities that requires focus while taking either of these medications.  Patient urged to follow-up with PCP if pain does not improve with treatment and rest or if pain becomes recurrent. Urged to return with worsening severe pain, loss of bowel or bladder control, trouble walking.   The patient verbalizes understanding and agrees with the plan.   MDM   Final diagnoses:  Back pain  Chronic pain   Concern for narcotics seeking behavior as noted in HPI.   Patient with back pain. X-ray neg. No neurological deficits. Patient is ambulatory. No warning symptoms of back pain including: loss of bowel or bladder control, night  sweats, waking from sleep with back pain, unexplained fevers or weight loss, h/o cancer, IVDU, recent trauma. No concern for cauda equina, epidural abscess, or other serious cause of back pain. Conservative measures such as rest, ice/heat and pain medicine indicated with PCP follow-up if no improvement with conservative management.   I personally performed the services described in this documentation, which was scribed in my presence. The recorded information has been reviewed and is accurate.   Renne CriglerJoshua Bradee Common, PA-C 04/14/13 [phone]

## 2013-04-14 NOTE — Discharge Instructions (Signed)
Please read and follow all provided instructions.  Your diagnoses today include:  1. Back pain   2. Chronic pain    Tests performed today include:  Vital signs - see below for your results today  Medications prescribed:   Robaxin (methocarbamol) - muscle relaxer medication  DO NOT drive or perform any activities that require you to be awake and alert because this medicine can make you drowsy.   Take any prescribed medications only as directed.  Home care instructions:   Follow any educational materials contained in this packet  Please rest, use ice or heat on your back for the next several days  Do not lift, push, pull anything more than 10 pounds for the next week  Follow-up instructions: Please follow-up with your primary care provider in the next 1 week for further evaluation of your symptoms. If you do not have a primary care doctor -- see below for referral information.   Return instructions:  SEEK IMMEDIATE MEDICAL ATTENTION IF YOU HAVE:  New numbness, tingling, weakness, or problem with the use of your arms or legs  Severe back pain not relieved with medications  Loss control of your bowels or bladder  Increasing pain in any areas of the body (such as chest or abdominal pain)  Shortness of breath, dizziness, or fainting.   Worsening nausea (feeling sick to your stomach), vomiting, fever, or sweats  Any other emergent concerns regarding your health   Additional Information:  Your vital signs today were: BP 156/90   Pulse 79   Temp(Src) 98.1 F (36.7 C) (Oral)   Resp 18   SpO2 100% If your blood pressure (BP) was elevated above 135/85 this visit, please have this repeated by your doctor within one month. --------------

## 2013-08-23 ENCOUNTER — Emergency Department (HOSPITAL_COMMUNITY): Payer: PRIVATE HEALTH INSURANCE

## 2013-08-23 ENCOUNTER — Emergency Department (HOSPITAL_COMMUNITY)
Admission: EM | Admit: 2013-08-23 | Discharge: 2013-08-23 | Discharge status: Against Medical Advice | Payer: PRIVATE HEALTH INSURANCE | Attending: Emergency Medicine | Admitting: Emergency Medicine

## 2013-08-23 ENCOUNTER — Encounter (HOSPITAL_COMMUNITY): Payer: Self-pay | Admitting: Emergency Medicine

## 2013-08-23 DIAGNOSIS — Y929 Unspecified place or not applicable: Secondary | ICD-10-CM | POA: Insufficient documentation

## 2013-08-23 DIAGNOSIS — IMO0002 Reserved for concepts with insufficient information to code with codable children: Secondary | ICD-10-CM | POA: Diagnosis not present

## 2013-08-23 DIAGNOSIS — M5442 Lumbago with sciatica, left side: Secondary | ICD-10-CM

## 2013-08-23 DIAGNOSIS — Z8739 Personal history of other diseases of the musculoskeletal system and connective tissue: Secondary | ICD-10-CM | POA: Diagnosis not present

## 2013-08-23 DIAGNOSIS — F411 Generalized anxiety disorder: Secondary | ICD-10-CM | POA: Diagnosis not present

## 2013-08-23 DIAGNOSIS — G8929 Other chronic pain: Secondary | ICD-10-CM | POA: Insufficient documentation

## 2013-08-23 DIAGNOSIS — R209 Unspecified disturbances of skin sensation: Secondary | ICD-10-CM | POA: Insufficient documentation

## 2013-08-23 DIAGNOSIS — R5383 Other fatigue: Secondary | ICD-10-CM | POA: Diagnosis not present

## 2013-08-23 DIAGNOSIS — F3289 Other specified depressive episodes: Secondary | ICD-10-CM | POA: Insufficient documentation

## 2013-08-23 DIAGNOSIS — F329 Major depressive disorder, single episode, unspecified: Secondary | ICD-10-CM | POA: Diagnosis not present

## 2013-08-23 DIAGNOSIS — R5381 Other malaise: Secondary | ICD-10-CM | POA: Diagnosis not present

## 2013-08-23 DIAGNOSIS — Y939 Activity, unspecified: Secondary | ICD-10-CM | POA: Diagnosis not present

## 2013-08-23 DIAGNOSIS — R296 Repeated falls: Secondary | ICD-10-CM | POA: Insufficient documentation

## 2013-08-23 DIAGNOSIS — Z87891 Personal history of nicotine dependence: Secondary | ICD-10-CM | POA: Insufficient documentation

## 2013-08-23 DIAGNOSIS — Z79899 Other long term (current) drug therapy: Secondary | ICD-10-CM | POA: Diagnosis not present

## 2013-08-23 MED ORDER — METHOCARBAMOL 1000 MG/10ML IJ SOLN
1000.0000 mg | Freq: Once | INTRAMUSCULAR | Status: DC
  Filled 2013-08-23: qty 10

## 2013-08-23 MED ORDER — OXYCODONE-ACETAMINOPHEN 5-325 MG PO TABS
2.0000 | ORAL_TABLET | Freq: Once | ORAL | Status: AC
Start: 2013-08-23 — End: 2013-08-23
  Administered 2013-08-23: 2 via ORAL
  Filled 2013-08-23: qty 2

## 2013-08-23 MED ORDER — NAPROXEN 500 MG PO TABS
500.0000 mg | ORAL_TABLET | Freq: Two times a day (BID) | ORAL | Status: DC
  Administered 2013-08-23: 500 mg via ORAL
  Filled 2013-08-23: qty 1

## 2013-08-23 NOTE — ED Notes (Signed)
Pt states that she had pain in left hip area yesterday and fell.  Now having back and left sciatica pain.  Pt denies hitting her head or taking any anticoagulants.

## 2013-08-23 NOTE — ED Provider Notes (Signed)
CSN: [id number]     Arrival date & time 08/23/13  1326 History   First MD Initiated Contact with Patient 08/23/13 1459     Chief Complaint  Patient presents with  . Fall  . Back Pain  . Sciatica     (Consider location/radiation/quality/duration/timing/severity/associated sxs/prior Treatment) HPI Keyah Fayrene Fearing is a 43 y.o. female who is here for evaluation of an exacerbation of her chronic low back pain. She has a hx of 2 back surgeries in 2013 - laminectomy of L4-5 - and has been seen here for this complaint multiple times over the past 2 yrs. She believes this is a flare of her L side sciatica that was exacerbated by a fall she experienced about 3 hrs ago. She said she thought she could feel "the attack" coming on and was unable to reach her cane in time and fell backward onto her L buttocks.  She describes this pain as feeling exactly the same as her chronic back pain, only more severe in nature. She has tried ice, tylenol, her home effexor and gabapentin w/o relief. She is currently on 8 mg of Dilaudid q4 at home and says she is out of her rx until she meets with pain management again on Wednesday next week.  She describes it as a stabbing 10/10 pain with numbness and tingling in her left foot. She denies any bowel or bladder incontinence, no fevers, cp or SOB. No saddle paraesthesias.  Past Medical History  Diagnosis Date  . Anxiety   . Depression   . Arthritis   . Osteoarthritis   . Chronic back pain   . Sciatica    Past Surgical History  Procedure Laterality Date  . Appendectomy  2011  . Lumbar laminectomy/decompression microdiscectomy  01/10/2011    Procedure: LUMBAR LAMINECTOMY/DECOMPRESSION MICRODISCECTOMY;  Surgeon: Javier Docker;  Location: [facility];  Service: Orthopedics;  Laterality: N/A;  Decompression L4 - L5  (X-Ray)  . Lumbar laminectomy/decompression microdiscectomy  08/02/2011    Procedure: LUMBAR LAMINECTOMY/DECOMPRESSION MICRODISCECTOMY;  Surgeon: Javier Docker, MD;   Location: [facility];  Service: Orthopedics;  Laterality: N/A;  Re-do Decompression L4-L5  . Back surgery     Family History  Problem Relation Age of Onset  . Heart failure Mother    History  Substance Use Topics  . Smoking status: Former Games developer  . Smokeless tobacco: Never Used  . Alcohol Use: No   OB History   Grav Para Term Preterm Abortions TAB SAB Ect Mult Living                 Review of Systems  Constitutional: Negative for fever.  Respiratory: Negative for shortness of breath.   Cardiovascular: Negative for chest pain.  Gastrointestinal: Negative for abdominal pain.       No bowel or bladder incontinence  Genitourinary: Negative for dysuria.  Musculoskeletal: Positive for back pain.  Skin: Negative for rash and wound.  Neurological: Positive for weakness and numbness.      Allergies  Hydrocodone  Home Medications   Prior to Admission medications   Medication Sig Start Date End Date Taking? Authorizing Provider  acetaminophen (TYLENOL) 500 MG tablet Take 500 mg by mouth every 6 (six) hours as needed for mild pain.   Yes Historical Provider, MD  clonazePAM (KLONOPIN) 1 MG tablet Take 1 mg by mouth 2 (two) times daily as needed for anxiety.    Yes Historical Provider, MD  Gabapentin Enacarbil ER (HORIZANT) 300 MG TBCR Take 1 tablet by  mouth daily.   Yes Historical Provider, MD  HYDROmorphone (DILAUDID) 8 MG tablet Take 8 mg by mouth every 4 (four) hours as needed for severe pain.   Yes Historical Provider, MD  LYSINE PO Take 3 tablets by mouth daily.   Yes Historical Provider, MD  magnesium oxide (MAG-OX) 400 MG tablet Take 400 mg by mouth daily.   Yes Historical Provider, MD  traZODone (DESYREL) 100 MG tablet Take 200 mg by mouth at bedtime.    Yes Historical Provider, MD  venlafaxine XR (EFFEXOR-XR) 150 MG 24 hr capsule Take 300 mg by mouth daily with breakfast.   Yes Historical Provider, MD   BP 115/53  Pulse 95  Temp(Src) 98.7 F (37.1 C) (Oral)  Resp 20  SpO2  100% Physical Exam  Constitutional: She is oriented to person, place, and time. She appears well-developed and well-nourished.  HENT:  Head: Normocephalic and atraumatic.  Eyes: Conjunctivae and EOM are normal. Right eye exhibits no discharge. Left eye exhibits no discharge.  Musculoskeletal:  Active ROM difficult to appreciate due to pain/discomfort experience by patient. She experiences tenderness in paravertebral lumbar spine, as well as bony tenderness over lumbar. She is also diffusely tender to palpation across her entire low back and L buttock Sensation and motor intact DP 2+ bil.  Neurological: She is alert and oriented to person, place, and time.  Skin: Skin is warm and dry. No rash noted.  No contusions or lesions appreciated over site of fall.    ED Course  Procedures (including critical care time) Labs Review Labs Reviewed - No data to display  Imaging Review Dg Lumbar Spine Complete  08/23/2013   CLINICAL DATA:  Fall with low back pain.  Greater on the left.  EXAM: LUMBAR SPINE - COMPLETE 4+ VIEW  COMPARISON:  04/14/2013  FINDINGS: The AP views minimally oblique. Intrauterine device incidentally noted. Maintenance of vertebral body height and alignment. Intervertebral disc heights are maintained.  IMPRESSION: No acute osseous abnormality.   Electronically Signed   By: Jeronimo GreavesKyle  Talbot M.D.   On: 08/23/2013 16:05     EKG Interpretation None      MDM  Vitals stable in ED Xray shows no acute pathology Plan made for Aleve, Robaxin and Percocet in ED, then DC with prednisone taper. PT LEFT AMA. Pt left after administration of Percocet and Aleve. Did not receive Robaxin. Per Nurse pt stated " My pain is not getting any better" and she then got up off the bed and walked out of ED unassisted. I was not able to speak with her as I was not aware she left.    Final diagnoses:  None   Meds given in ED:  Medications  naproxen (NAPROSYN) tablet 500 mg (500 mg Oral Given 08/23/13  1553)  methocarbamol (ROBAXIN) injection 1,000 mg (not administered)  oxyCODONE-acetaminophen (PERCOCET/ROXICET) 5-325 MG per tablet 2 tablet (2 tablets Oral Given 08/23/13 1553)    Discharge Medication List as of 08/23/2013  4:18 PM     Prior to patient discharge, I discussed and reviewed this case with Dr.Ray        Sharlene MottsBenjamin W Chiron Campione, PA-C 08/23/13 1700

## 2013-08-23 NOTE — ED Notes (Signed)
Pt decided to leave AMA. States that she did not want to wait any longer. PA MD notified.

## 2013-08-24 NOTE — ED Provider Notes (Signed)
History/physical exam/procedure(s) were performed by non-physician practitioner and as supervising physician I was immediately available for consultation/collaboration. I have reviewed all notes and am in agreement with care and plan.   Hilario Quarryanielle S Angenette Daily, MD 08/24/13 [phone]

## 2013-09-20 ENCOUNTER — Emergency Department (HOSPITAL_COMMUNITY)
Admission: EM | Admit: 2013-09-20 | Discharge: 2013-09-20 | Disposition: A | Discharge status: Home / Self Care | Payer: PRIVATE HEALTH INSURANCE | Attending: Emergency Medicine | Admitting: Emergency Medicine

## 2013-09-20 ENCOUNTER — Encounter (HOSPITAL_COMMUNITY): Payer: Self-pay | Admitting: Emergency Medicine

## 2013-09-20 ENCOUNTER — Emergency Department (HOSPITAL_COMMUNITY): Payer: PRIVATE HEALTH INSURANCE

## 2013-09-20 DIAGNOSIS — F329 Major depressive disorder, single episode, unspecified: Secondary | ICD-10-CM | POA: Insufficient documentation

## 2013-09-20 DIAGNOSIS — F3289 Other specified depressive episodes: Secondary | ICD-10-CM | POA: Diagnosis not present

## 2013-09-20 DIAGNOSIS — M1990 Unspecified osteoarthritis, unspecified site: Secondary | ICD-10-CM | POA: Diagnosis not present

## 2013-09-20 DIAGNOSIS — Z9889 Other specified postprocedural states: Secondary | ICD-10-CM | POA: Diagnosis not present

## 2013-09-20 DIAGNOSIS — Z79899 Other long term (current) drug therapy: Secondary | ICD-10-CM | POA: Diagnosis not present

## 2013-09-20 DIAGNOSIS — F411 Generalized anxiety disorder: Secondary | ICD-10-CM | POA: Insufficient documentation

## 2013-09-20 DIAGNOSIS — G8929 Other chronic pain: Secondary | ICD-10-CM | POA: Insufficient documentation

## 2013-09-20 DIAGNOSIS — Z87891 Personal history of nicotine dependence: Secondary | ICD-10-CM | POA: Insufficient documentation

## 2013-09-20 DIAGNOSIS — M549 Dorsalgia, unspecified: Secondary | ICD-10-CM | POA: Insufficient documentation

## 2013-09-20 LAB — BASIC METABOLIC PANEL
ANION GAP: 16 — AB (ref 5–15)
BUN: 10 mg/dL (ref 6–23)
CHLORIDE: 100 meq/L (ref 96–112)
CO2: 21 meq/L (ref 19–32)
CREATININE: 0.78 mg/dL (ref 0.50–1.10)
Calcium: 9.7 mg/dL (ref 8.4–10.5)
GFR calc Af Amer: >90 mL/min (ref >90)
GFR calc non Af Amer: >90 mL/min (ref >90)
GLUCOSE: 92 mg/dL (ref 70–99)
Potassium: 4.2 mEq/L (ref 3.7–5.3)
Sodium: 137 mEq/L (ref 137–147)

## 2013-09-20 MED ORDER — GADOBENATE DIMEGLUMINE 529 MG/ML IV SOLN
20.0000 mL | Freq: Once | INTRAVENOUS | Status: AC | PRN
  Administered 2013-09-20: 20 mL via INTRAVENOUS

## 2013-09-20 MED ORDER — HYDROMORPHONE HCL PF 1 MG/ML IJ SOLN
2.0000 mg | Freq: Once | INTRAMUSCULAR | Status: AC
  Administered 2013-09-20: 2 mg via INTRAVENOUS
  Filled 2013-09-20: qty 2

## 2013-09-20 MED ORDER — LORAZEPAM 2 MG/ML IJ SOLN
1.0000 mg | Freq: Once | INTRAMUSCULAR | Status: AC
  Administered 2013-09-20: 1 mg via INTRAVENOUS
  Filled 2013-09-20: qty 1

## 2013-09-20 MED ORDER — DEXAMETHASONE SODIUM PHOSPHATE 10 MG/ML IJ SOLN
10.0000 mg | Freq: Once | INTRAMUSCULAR | Status: AC
  Administered 2013-09-20: 10 mg via INTRAVENOUS
  Filled 2013-09-20: qty 1

## 2013-09-20 MED ORDER — PREDNISONE 10 MG PO TABS: 20.0000 mg | ORAL_TABLET | Freq: Every day | ORAL | Status: DC

## 2013-09-20 NOTE — Discharge Instructions (Signed)

## 2013-09-20 NOTE — ED Notes (Signed)
Patient reports Hx of chronic back pain. Hx of L4, L5, S1 back pain. Reports pain radiates down bilateral legs. Patient reports mild numbness in left leg at baseline.

## 2013-09-20 NOTE — ED Notes (Signed)
Patient presents POV for lower back pain x1 week, radiating to lower mid abdominal pain. Rates pain 10/10. Constant throbbing at rest. Sharp, stabbing pain with movement. Denies N/V. Patient reports last BM was this morning, normal for patient. Denies fever, chills. Denies urinary symptoms at this time.

## 2013-09-20 NOTE — ED Notes (Signed)
Patient is in MRI.  

## 2013-09-20 NOTE — ED Provider Notes (Signed)
CSN: [id number]     Arrival date & time 09/20/13  1045 History   First MD Initiated Contact with Patient 09/20/13 1121     Chief Complaint  Patient presents with  . Back Pain     (Consider location/radiation/quality/duration/timing/severity/associated sxs/prior Treatment) HPI Comments: Patient here with worsening chronic back pain x1 week. Pain starts in her left side radiates across her back. It is down to her lower abdomen. Patient denies any new weakness in her lower extremities. No bowel or bladder dysfunction. Has not used her hydromorphone today. Pain is better with remaining still. She does use a cane to walk around. Denies any recent injuries.  Patient is a 43 y.o. female presenting with back pain. The history is provided by the patient.  Back Pain   Past Medical History  Diagnosis Date  . Anxiety   . Depression   . Arthritis   . Osteoarthritis   . Chronic back pain   . Sciatica    Past Surgical History  Procedure Laterality Date  . Appendectomy  2011  . Lumbar laminectomy/decompression microdiscectomy  01/10/2011    Procedure: LUMBAR LAMINECTOMY/DECOMPRESSION MICRODISCECTOMY;  Surgeon: Javier Docker;  Location: [facility];  Service: Orthopedics;  Laterality: N/A;  Decompression L4 - L5  (X-Ray)  . Lumbar laminectomy/decompression microdiscectomy  08/02/2011    Procedure: LUMBAR LAMINECTOMY/DECOMPRESSION MICRODISCECTOMY;  Surgeon: Javier Docker, MD;  Location: [facility];  Service: Orthopedics;  Laterality: N/A;  Re-do Decompression L4-L5  . Back surgery     Family History  Problem Relation Age of Onset  . Heart failure Mother    History  Substance Use Topics  . Smoking status: Former Games developer  . Smokeless tobacco: Never Used  . Alcohol Use: No   OB History   Grav Para Term Preterm Abortions TAB SAB Ect Mult Living                 Review of Systems  Musculoskeletal: Positive for back pain.  All other systems reviewed and are negative.     Allergies   Hydrocodone  Home Medications   Prior to Admission medications   Medication Sig Start Date End Date Taking? Authorizing Provider  acetaminophen (TYLENOL) 500 MG tablet Take 500 mg by mouth every 6 (six) hours as needed for mild pain.   Yes Historical Provider, MD  clonazePAM (KLONOPIN) 1 MG tablet Take 1 mg by mouth 2 (two) times daily as needed for anxiety.    Yes Historical Provider, MD  Gabapentin Enacarbil ER (HORIZANT) 300 MG TBCR Take 1 tablet by mouth every evening.    Yes Historical Provider, MD  HYDROmorphone (DILAUDID) 8 MG tablet Take 8 mg by mouth every 4 (four) hours as needed for severe pain.   Yes Historical Provider, MD  LYSINE PO Take 3 tablets by mouth daily.   Yes Historical Provider, MD  magnesium oxide (MAG-OX) 400 MG tablet Take 400 mg by mouth every evening.    Yes Historical Provider, MD  traZODone (DESYREL) 100 MG tablet Take 200 mg by mouth at bedtime.    Yes Historical Provider, MD  venlafaxine XR (EFFEXOR-XR) 150 MG 24 hr capsule Take 300 mg by mouth daily with breakfast.   Yes Historical Provider, MD   BP 143/100  Pulse 86  Temp(Src) 98.2 F (36.8 C) (Oral)  Resp 18  SpO2 100% Physical Exam  Nursing note and vitals reviewed. Constitutional: She is oriented to person, place, and time. She appears well-developed and well-nourished.  Non-toxic appearance. No distress.  HENT:  Head: Normocephalic and atraumatic.  Eyes: Conjunctivae, EOM and lids are normal. Pupils are equal, round, and reactive to light.  Neck: Normal range of motion. Neck supple. No tracheal deviation present. No mass present.  Cardiovascular: Normal rate, regular rhythm and normal heart sounds.  Exam reveals no gallop.   No murmur heard. Pulmonary/Chest: Effort normal and breath sounds normal. No stridor. No respiratory distress. She has no decreased breath sounds. She has no wheezes. She has no rhonchi. She has no rales.  Abdominal: Soft. Normal appearance and bowel sounds are normal. She  exhibits no distension. There is no tenderness. There is no rebound and no CVA tenderness.  Musculoskeletal: Normal range of motion. She exhibits no edema and no tenderness.       Back:  Neurological: She is alert and oriented to person, place, and time. She has normal strength. No cranial nerve deficit or sensory deficit. GCS eye subscore is 4. GCS verbal subscore is 5. GCS motor subscore is 6.  Reflex Scores:      Patellar reflexes are 1+ on the right side and 1+ on the left side. Skin: Skin is warm and dry. No abrasion and no rash noted.  Psychiatric: She has a normal mood and affect. Her speech is normal and behavior is normal.    ED Course  Procedures (including critical care time) Labs Review Labs Reviewed - No data to display  Imaging Review No results found.   EKG Interpretation None      MDM   Final diagnoses:  None    Pt given pain meds here and feels better, neuro exam stable, will place on prednisone and she will f/u her doctors at [facility]    Toy Baker, MD 09/20/13 1511

## 2013-10-20 ENCOUNTER — Encounter (HOSPITAL_COMMUNITY): Payer: Self-pay | Admitting: Emergency Medicine

## 2013-10-20 ENCOUNTER — Emergency Department (HOSPITAL_COMMUNITY): Payer: PRIVATE HEALTH INSURANCE

## 2013-10-20 ENCOUNTER — Emergency Department (HOSPITAL_COMMUNITY)
Admission: EM | Admit: 2013-10-20 | Discharge: 2013-10-20 | Disposition: A | Discharge status: Home / Self Care | Payer: PRIVATE HEALTH INSURANCE | Attending: Emergency Medicine | Admitting: Emergency Medicine

## 2013-10-20 DIAGNOSIS — Z79899 Other long term (current) drug therapy: Secondary | ICD-10-CM | POA: Insufficient documentation

## 2013-10-20 DIAGNOSIS — F419 Anxiety disorder, unspecified: Secondary | ICD-10-CM | POA: Insufficient documentation

## 2013-10-20 DIAGNOSIS — R1032 Left lower quadrant pain: Secondary | ICD-10-CM | POA: Insufficient documentation

## 2013-10-20 DIAGNOSIS — M549 Dorsalgia, unspecified: Secondary | ICD-10-CM | POA: Insufficient documentation

## 2013-10-20 DIAGNOSIS — F329 Major depressive disorder, single episode, unspecified: Secondary | ICD-10-CM | POA: Insufficient documentation

## 2013-10-20 DIAGNOSIS — Z87891 Personal history of nicotine dependence: Secondary | ICD-10-CM | POA: Diagnosis not present

## 2013-10-20 DIAGNOSIS — G8929 Other chronic pain: Secondary | ICD-10-CM | POA: Diagnosis not present

## 2013-10-20 DIAGNOSIS — Z3202 Encounter for pregnancy test, result negative: Secondary | ICD-10-CM | POA: Insufficient documentation

## 2013-10-20 DIAGNOSIS — M5489 Other dorsalgia: Secondary | ICD-10-CM

## 2013-10-20 LAB — CBC WITH DIFFERENTIAL/PLATELET
Basophils Absolute: 0 10*3/uL (ref 0.0–0.1)
Basophils Relative: 0 % (ref 0–1)
Eosinophils Absolute: 0 10*3/uL (ref 0.0–0.7)
Eosinophils Relative: 0 % (ref 0–5)
HEMATOCRIT: 38.8 % (ref 36.0–46.0)
HEMOGLOBIN: 12.7 g/dL (ref 12.0–15.0)
Lymphocytes Relative: 24 % (ref 12–46)
Lymphs Abs: 4.1 10*3/uL — ABNORMAL HIGH (ref 0.7–4.0)
MCH: 31.8 pg (ref 26.0–34.0)
MCHC: 32.7 g/dL (ref 30.0–36.0)
MCV: 97 fL (ref 78.0–100.0)
MONO ABS: 0.5 10*3/uL (ref 0.1–1.0)
MONOS PCT: 3 % (ref 3–12)
NEUTROS ABS: 12.5 10*3/uL — AB (ref 1.7–7.7)
Neutrophils Relative %: 73 % (ref 43–77)
Platelets: 445 10*3/uL — ABNORMAL HIGH (ref 150–400)
RBC: 4 MIL/uL (ref 3.87–5.11)
RDW: 13.8 % (ref 11.5–15.5)
WBC: 17.1 10*3/uL — ABNORMAL HIGH (ref 4.0–10.5)

## 2013-10-20 LAB — URINALYSIS, ROUTINE W REFLEX MICROSCOPIC
BILIRUBIN URINE: NEGATIVE
Glucose, UA: NEGATIVE mg/dL
HGB URINE DIPSTICK: NEGATIVE
Ketones, ur: NEGATIVE mg/dL
Leukocytes, UA: NEGATIVE
Nitrite: NEGATIVE
Protein, ur: NEGATIVE mg/dL
Specific Gravity, Urine: 1.012 (ref 1.005–1.030)
UROBILINOGEN UA: 0.2 mg/dL (ref 0.0–1.0)
pH: 6.5 (ref 5.0–8.0)

## 2013-10-20 LAB — POC URINE PREG, ED: Preg Test, Ur: NEGATIVE

## 2013-10-20 LAB — COMPREHENSIVE METABOLIC PANEL
ALT: 11 U/L (ref 0–35)
ANION GAP: 13 (ref 5–15)
AST: 13 U/L (ref 0–37)
Albumin: 3.8 g/dL (ref 3.5–5.2)
Alkaline Phosphatase: 109 U/L (ref 39–117)
BUN: 8 mg/dL (ref 6–23)
CHLORIDE: 101 meq/L (ref 96–112)
CO2: 24 meq/L (ref 19–32)
CREATININE: 0.86 mg/dL (ref 0.50–1.10)
Calcium: 9.4 mg/dL (ref 8.4–10.5)
GFR calc Af Amer: >90 mL/min (ref >90)
GFR calc non Af Amer: 82 mL/min — ABNORMAL LOW (ref >90)
Glucose, Bld: 112 mg/dL — ABNORMAL HIGH (ref 70–99)
Potassium: 4.2 mEq/L (ref 3.7–5.3)
Sodium: 138 mEq/L (ref 137–147)
Total Bilirubin: 0.2 mg/dL — ABNORMAL LOW (ref 0.3–1.2)
Total Protein: 7.7 g/dL (ref 6.0–8.3)

## 2013-10-20 LAB — LIPASE, BLOOD: Lipase: 18 U/L (ref 11–59)

## 2013-10-20 MED ORDER — IOHEXOL 300 MG/ML  SOLN
50.0000 mL | Freq: Once | INTRAMUSCULAR | Status: AC | PRN
  Administered 2013-10-20: 50 mL via ORAL

## 2013-10-20 MED ORDER — HYDROMORPHONE HCL 1 MG/ML IJ SOLN
1.0000 mg | Freq: Once | INTRAMUSCULAR | Status: AC
  Administered 2013-10-20: 1 mg via INTRAVENOUS
  Filled 2013-10-20: qty 1

## 2013-10-20 MED ORDER — IOHEXOL 300 MG/ML  SOLN
80.0000 mL | Freq: Once | INTRAMUSCULAR | Status: AC | PRN
Start: 2013-10-20 — End: 2013-10-20
  Administered 2013-10-20: 80 mL via INTRAVENOUS

## 2013-10-20 MED ORDER — METHYLPREDNISOLONE 4 MG PO KIT: PACK | ORAL | Status: DC

## 2013-10-20 NOTE — ED Notes (Signed)
PT had taken 8 mg dilaudid this am at 0400 with some relief. Taken at home.

## 2013-10-20 NOTE — Discharge Instructions (Signed)
Abdominal Pain, Women °Abdominal (stomach, pelvic, or belly) pain can be caused by many things. It is important to tell your doctor: °· The location of the pain. °· Does it come and go or is it present all the time? °· Are there things that start the pain (eating certain foods, exercise)? °· Are there other symptoms associated with the pain (fever, nausea, vomiting, diarrhea)? °All of this is helpful to know when trying to find the cause of the pain. °CAUSES  °· Stomach: virus or bacteria infection, or ulcer. °· Intestine: appendicitis (inflamed appendix), regional ileitis (Crohn's disease), ulcerative colitis (inflamed colon), irritable bowel syndrome, diverticulitis (inflamed diverticulum of the colon), or cancer of the stomach or intestine. °· Gallbladder disease or stones in the gallbladder. °· Kidney disease, kidney stones, or infection. °· Pancreas infection or cancer. °· Fibromyalgia (pain disorder). °· Diseases of the female organs: °¨ Uterus: fibroid (non-cancerous) tumors or infection. °¨ Fallopian tubes: infection or tubal pregnancy. °¨ Ovary: cysts or tumors. °¨ Pelvic adhesions (scar tissue). °¨ Endometriosis (uterus lining tissue growing in the pelvis and on the pelvic organs). °¨ Pelvic congestion syndrome (female organs filling up with blood just before the menstrual period). °¨ Pain with the menstrual period. °¨ Pain with ovulation (producing an egg). °¨ Pain with an IUD (intrauterine device, birth control) in the uterus. °¨ Cancer of the female organs. °· Functional pain (pain not caused by a disease, may improve without treatment). °· Psychological pain. °· Depression. °DIAGNOSIS  °Your doctor will decide the seriousness of your pain by doing an examination. °· Blood tests. °· X-rays. °· Ultrasound. °· CT scan (computed tomography, special type of X-ray). °· MRI (magnetic resonance imaging). °· Cultures, for infection. °· Barium enema (dye inserted in the large intestine, to better view it with  X-rays). °· Colonoscopy (looking in intestine with a lighted tube). °· Laparoscopy (minor surgery, looking in abdomen with a lighted tube). °· Major abdominal exploratory surgery (looking in abdomen with a large incision). °TREATMENT  °The treatment will depend on the cause of the pain.  °· Many cases can be observed and treated at home. °· Over-the-counter medicines recommended by your caregiver. °· Prescription medicine. °· Antibiotics, for infection. °· Birth control pills, for painful periods or for ovulation pain. °· Hormone treatment, for endometriosis. °· Nerve blocking injections. °· Physical therapy. °· Antidepressants. °· Counseling with a psychologist or psychiatrist. °· Minor or major surgery. °HOME CARE INSTRUCTIONS  °· Do not take laxatives, unless directed by your caregiver. °· Take over-the-counter pain medicine only if ordered by your caregiver. Do not take aspirin because it can cause an upset stomach or bleeding. °· Try a clear liquid diet (broth or water) as ordered by your caregiver. Slowly move to a bland diet, as tolerated, if the pain is related to the stomach or intestine. °· Have a thermometer and take your temperature several times a day, and record it. °· Bed rest and sleep, if it helps the pain. °· Avoid sexual intercourse, if it causes pain. °· Avoid stressful situations. °· Keep your follow-up appointments and tests, as your caregiver orders. °· If the pain does not go away with medicine or surgery, you may try: °¨ Acupuncture. °¨ Relaxation exercises (yoga, meditation). °¨ Group therapy. °¨ Counseling. °SEEK MEDICAL CARE IF:  °· You notice certain foods cause stomach pain. °· Your home care treatment is not helping your pain. °· You need stronger pain medicine. °· You want your IUD removed. °· You feel faint or   lightheaded. °· You develop nausea and vomiting. °· You develop a rash. °· You are having side effects or an allergy to your medicine. °SEEK IMMEDIATE MEDICAL CARE IF:  °· Your  pain does not go away or gets worse. °· You have a fever. °· Your pain is felt only in portions of the abdomen. The right side could possibly be appendicitis. The left lower portion of the abdomen could be colitis or diverticulitis. °· You are passing blood in your stools (bright red or black tarry stools, with or without vomiting). °· You have blood in your urine. °· You develop chills, with or without a fever. °· You pass out. °MAKE SURE YOU:  °· Understand these instructions. °· Will watch your condition. °· Will get help right away if you are not doing well or get worse. °Document Released: 10/22/2006 Document Revised: 05/11/2013 Document Reviewed: 11/11/2008 °[employer]® Patient Information ©2015 [employer]. This information is not intended to replace advice given to you by your health care provider. Make sure you discuss any questions you have with your health care provider. ° °

## 2013-10-20 NOTE — ED Notes (Signed)
Patient states she can not give urine sample at this time, she wants to wait to see the doctor first

## 2013-10-20 NOTE — ED Notes (Signed)
AVS explained in detail. Ambulatory and neurologically intact. No other questions/concerns.

## 2013-10-20 NOTE — ED Provider Notes (Signed)
CSN: [id number]     Arrival date & time 10/20/13  7829 History   First MD Initiated Contact with Patient 10/20/13 1036     Chief Complaint  Patient presents with  . Back Pain  . Abdominal Pain     (Consider location/radiation/quality/duration/timing/severity/associated sxs/prior Treatment) Patient is a 43 y.o. female presenting with abdominal pain. The history is provided by the patient.  Abdominal Pain Pain location:  LLQ Pain quality: aching and sharp   Pain radiates to:  Does not radiate Pain severity:  Moderate Onset quality:  Gradual Timing:  Constant Progression:  Worsening Chronicity:  Chronic Context comment:  At rest Relieved by:  Nothing Worsened by:  Nothing tried Ineffective treatments: dilaudid. Associated symptoms: no cough, no diarrhea, no fatigue, no hematuria, no nausea, no shortness of breath and no vomiting     Past Medical History  Diagnosis Date  . Anxiety   . Depression   . Arthritis   . Osteoarthritis   . Chronic back pain   . Sciatica    Past Surgical History  Procedure Laterality Date  . Appendectomy  2011  . Lumbar laminectomy/decompression microdiscectomy  01/10/2011    Procedure: LUMBAR LAMINECTOMY/DECOMPRESSION MICRODISCECTOMY;  Surgeon: Javier Docker;  Location: [facility];  Service: Orthopedics;  Laterality: N/A;  Decompression L4 - L5  (X-Ray)  . Lumbar laminectomy/decompression microdiscectomy  08/02/2011    Procedure: LUMBAR LAMINECTOMY/DECOMPRESSION MICRODISCECTOMY;  Surgeon: Javier Docker, MD;  Location: [facility];  Service: Orthopedics;  Laterality: N/A;  Re-do Decompression L4-L5  . Back surgery     Family History  Problem Relation Age of Onset  . Heart failure Mother    History  Substance Use Topics  . Smoking status: Former Games developer  . Smokeless tobacco: Never Used  . Alcohol Use: No   OB History   Grav Para Term Preterm Abortions TAB SAB Ect Mult Living                 Review of Systems  Constitutional: Negative for  fatigue.  HENT: Negative for congestion and drooling.   Eyes: Negative for pain.  Respiratory: Negative for cough and shortness of breath.   Gastrointestinal: Positive for abdominal pain. Negative for nausea, vomiting and diarrhea.  Genitourinary: Negative for hematuria.  Musculoskeletal: Negative for gait problem and neck pain.  Skin: Negative for color change.  Neurological: Negative for dizziness.  Hematological: Negative for adenopathy.  Psychiatric/Behavioral: Negative for behavioral problems.  All other systems reviewed and are negative.     Allergies  Hydrocodone  Home Medications   Prior to Admission medications   Medication Sig Start Date End Date Taking? Authorizing Provider  clonazePAM (KLONOPIN) 1 MG tablet Take 1 mg by mouth 2 (two) times daily as needed for anxiety.    Yes Historical Provider, MD  Gabapentin Enacarbil ER (HORIZANT) 300 MG TBCR Take 1 tablet by mouth every evening.    Yes Historical Provider, MD  Chilton Si Tea, Camillia sinensis, (GREEN TEA PO) Take 1 tablet by mouth daily.   Yes Historical Provider, MD  HYDROmorphone (DILAUDID) 8 MG tablet Take 8 mg by mouth every 4 (four) hours as needed for severe pain.   Yes Historical Provider, MD  LYSINE PO Take 3 tablets by mouth daily.   Yes Historical Provider, MD  magnesium oxide (MAG-OX) 400 MG tablet Take 800 mg by mouth every evening.    Yes Historical Provider, MD  Multiple Vitamin (MULTIVITAMIN WITH MINERALS) TABS tablet Take 1 tablet by mouth daily.  Yes Historical Provider, MD  traZODone (DESYREL) 100 MG tablet Take 200 mg by mouth at bedtime.    Yes Historical Provider, MD  venlafaxine XR (EFFEXOR-XR) 150 MG 24 hr capsule Take 300 mg by mouth daily with breakfast.   Yes Historical Provider, MD   BP 130/67  Pulse 92  Temp(Src) 98.5 F (36.9 C) (Oral)  Resp 16  SpO2 97% Physical Exam  Nursing note and vitals reviewed. Constitutional: She is oriented to person, place, and time. She appears  well-developed and well-nourished.  HENT:  Head: Normocephalic and atraumatic.  Mouth/Throat: Oropharynx is clear and moist. No oropharyngeal exudate.  Eyes: Conjunctivae and EOM are normal. Pupils are equal, round, and reactive to light.  Neck: Normal range of motion. Neck supple.  Cardiovascular: Normal rate, regular rhythm, normal heart sounds and intact distal pulses.  Exam reveals no gallop and no friction rub.   No murmur heard. Pulmonary/Chest: Effort normal and breath sounds normal. No respiratory distress. She has no wheezes.  Abdominal: Soft. Bowel sounds are normal. There is tenderness (mild to mod ttp of LLQ). There is no rebound and no guarding.  Musculoskeletal: Normal range of motion. She exhibits no edema and no tenderness.  Neurological: She is alert and oriented to person, place, and time.  Skin: Skin is warm and dry.  Psychiatric: She has a normal mood and affect. Her behavior is normal.    ED Course  Procedures (including critical care time) Labs Review Labs Reviewed  CBC WITH DIFFERENTIAL - Abnormal; Notable for the following:    WBC 17.1 (*)    Platelets 445 (*)    Neutro Abs 12.5 (*)    Lymphs Abs 4.1 (*)    All other components within normal limits  COMPREHENSIVE METABOLIC PANEL - Abnormal; Notable for the following:    Glucose, Bld 112 (*)    Total Bilirubin 0.2 (*)    GFR calc non Af Amer 82 (*)    All other components within normal limits  URINALYSIS, ROUTINE W REFLEX MICROSCOPIC - Abnormal; Notable for the following:    APPearance CLOUDY (*)    All other components within normal limits  LIPASE, BLOOD  POC URINE PREG, ED    Imaging Review Ct Abdomen Pelvis W Contrast  10/20/2013   CLINICAL DATA:  Chronic low back pain  EXAM: CT ABDOMEN AND PELVIS WITH CONTRAST  TECHNIQUE: Multidetector CT imaging of the abdomen and pelvis was performed using the standard protocol following bolus administration of intravenous contrast.  CONTRAST:  50mL OMNIPAQUE  IOHEXOL 300 MG/ML SOLN, 80mL OMNIPAQUE IOHEXOL 300 MG/ML SOLN  COMPARISON:  None.  FINDINGS: The lung bases are clear.  The liver demonstrates no focal abnormality. There is no intrahepatic or extrahepatic biliary ductal dilatation. The gallbladder is normal. The spleen demonstrates no focal abnormality. The kidneys, adrenal glands and pancreas are normal. The bladder is unremarkable.  The stomach, duodenum, small intestine, and large intestine demonstrate no contrast extravasation or dilatation. There is a small fat containing umbilical hernia. There is no pneumoperitoneum, pneumatosis, or portal venous gas. There is no abdominal or pelvic free fluid. There is no lymphadenopathy. There is a T10 of IUD within the uterus.  The abdominal aorta is normal in caliber.  There are no lytic or sclerotic osseous lesions. Partial left L4-5 facetectomy.  IMPRESSION: 1. No acute abdominal or pelvic pathology.   Electronically Signed   By: Elige KoHetal  Patel   On: 10/20/2013 12:56     EKG Interpretation None  MDM   Final diagnoses:  Left lower quadrant pain  Other back pain    10:48 AM 43 y.o. female w hx of chronic back pain who pw LLQ pain since may 2015. Pain is constant. Denies fevers, diarrhea, vomiting. AFVSS here. Will get labs/imaging/pain control. Back pain is at baseline w/out significant change.   1:55 PM: Mildly elev wbc, otherwise labs/imaging non-contrib. Pt continues to appear well. She requested a medrol dose pak for her back.  I have discussed the diagnosis/risks/treatment options with the patient and believe the pt to be eligible for discharge home to follow-up with her pcp. We also discussed returning to the ED immediately if new or worsening sx occur. We discussed the sx which are most concerning (e.g., worsening pain, fever) that necessitate immediate return. Medications administered to the patient during their visit and any new prescriptions provided to the patient are listed  below.  Medications given during this visit Medications  HYDROmorphone (DILAUDID) injection 1 mg (1 mg Intravenous Given 10/20/13 1103)  iohexol (OMNIPAQUE) 300 MG/ML solution 50 mL (50 mLs Oral Contrast Given 10/20/13 1216)  HYDROmorphone (DILAUDID) injection 1 mg (1 mg Intravenous Given 10/20/13 1235)  iohexol (OMNIPAQUE) 300 MG/ML solution 80 mL (80 mLs Intravenous Contrast Given 10/20/13 1217)    New Prescriptions   METHYLPREDNISOLONE (MEDROL DOSEPAK) 4 MG TABLET    follow package directions     Purvis SheffieldForrest Raiquan Chandler, MD 10/20/13 1553

## 2013-10-20 NOTE — ED Notes (Addendum)
Pt c/o chronic lower back and LLQ pain x 1 month.  Denies n/v/d.  Pain score 10/10.  [facility] same.  Reports that she has not followed up with anyone.  Sts she took a dilaudid this morning w/ "a little" relief.

## 2013-10-23 ENCOUNTER — Other Ambulatory Visit: Payer: Self-pay

## 2014-03-01 ENCOUNTER — Emergency Department (HOSPITAL_COMMUNITY)
Admission: EM | Admit: 2014-03-01 | Discharge: 2014-03-01 | Disposition: A | Discharge status: Home / Self Care | Payer: Medicare Other | Attending: Emergency Medicine | Admitting: Emergency Medicine

## 2014-03-01 ENCOUNTER — Encounter (HOSPITAL_COMMUNITY): Payer: Self-pay | Admitting: Emergency Medicine

## 2014-03-01 ENCOUNTER — Emergency Department (HOSPITAL_COMMUNITY): Payer: Medicare Other

## 2014-03-01 DIAGNOSIS — F419 Anxiety disorder, unspecified: Secondary | ICD-10-CM | POA: Insufficient documentation

## 2014-03-01 DIAGNOSIS — N644 Mastodynia: Secondary | ICD-10-CM | POA: Insufficient documentation

## 2014-03-01 DIAGNOSIS — F329 Major depressive disorder, single episode, unspecified: Secondary | ICD-10-CM | POA: Insufficient documentation

## 2014-03-01 DIAGNOSIS — M5410 Radiculopathy, site unspecified: Secondary | ICD-10-CM

## 2014-03-01 DIAGNOSIS — Z79899 Other long term (current) drug therapy: Secondary | ICD-10-CM | POA: Diagnosis not present

## 2014-03-01 DIAGNOSIS — M79602 Pain in left arm: Secondary | ICD-10-CM | POA: Diagnosis present

## 2014-03-01 DIAGNOSIS — G8929 Other chronic pain: Secondary | ICD-10-CM | POA: Insufficient documentation

## 2014-03-01 DIAGNOSIS — Z87891 Personal history of nicotine dependence: Secondary | ICD-10-CM | POA: Diagnosis not present

## 2014-03-01 LAB — COMPREHENSIVE METABOLIC PANEL
ALBUMIN: 3.9 g/dL (ref 3.5–5.2)
ALT: 23 U/L (ref 0–35)
ANION GAP: 7 (ref 5–15)
AST: 20 U/L (ref 0–37)
Alkaline Phosphatase: 105 U/L (ref 39–117)
BILIRUBIN TOTAL: 0.7 mg/dL (ref 0.3–1.2)
BUN: 12 mg/dL (ref 6–23)
CALCIUM: 9.3 mg/dL (ref 8.4–10.5)
CO2: 27 mmol/L (ref 19–32)
Chloride: 102 mmol/L (ref 96–112)
Creatinine, Ser: 0.85 mg/dL (ref 0.50–1.10)
GFR calc Af Amer: >90 mL/min (ref >90)
GFR, EST NON AFRICAN AMERICAN: 82 mL/min — AB (ref >90)
Glucose, Bld: 96 mg/dL (ref 70–99)
POTASSIUM: 4.2 mmol/L (ref 3.5–5.1)
Sodium: 136 mmol/L (ref 135–145)
TOTAL PROTEIN: 8.1 g/dL (ref 6.0–8.3)

## 2014-03-01 LAB — CBC
HCT: 40.1 % (ref 36.0–46.0)
Hemoglobin: 13.2 g/dL (ref 12.0–15.0)
MCH: 32.8 pg (ref 26.0–34.0)
MCHC: 32.9 g/dL (ref 30.0–36.0)
MCV: 99.5 fL (ref 78.0–100.0)
PLATELETS: 324 10*3/uL (ref 150–400)
RBC: 4.03 MIL/uL (ref 3.87–5.11)
RDW: 14.1 % (ref 11.5–15.5)
WBC: 17 10*3/uL — ABNORMAL HIGH (ref 4.0–10.5)

## 2014-03-01 LAB — PROTIME-INR
INR: 0.95 (ref 0.00–1.49)
PROTHROMBIN TIME: 12.7 s (ref 11.6–15.2)

## 2014-03-01 LAB — D-DIMER, QUANTITATIVE: D-Dimer, Quant: 0.48 ug/mL-FEU (ref 0.00–0.48)

## 2014-03-01 LAB — APTT: aPTT: 25 seconds (ref 24–37)

## 2014-03-01 MED ORDER — SODIUM CHLORIDE 0.9 % IV SOLN
1000.0000 mL | INTRAVENOUS | Status: DC
  Administered 2014-03-01: 1000 mL via INTRAVENOUS

## 2014-03-01 MED ORDER — HYDROMORPHONE HCL 1 MG/ML IJ SOLN
1.0000 mg | Freq: Once | INTRAMUSCULAR | Status: AC
  Administered 2014-03-01: 1 mg via INTRAVENOUS
  Filled 2014-03-01: qty 1

## 2014-03-01 NOTE — ED Notes (Signed)
Unable to obtain useable EKG s/t patient movement, patient refuses to lay completely flat. Second staff member recruited to assist with obtaining EKG.

## 2014-03-01 NOTE — ED Notes (Signed)
Pt c/o left arm pain since Saturday, left hip pain onset 1 week ago. Denies any recent trauma.

## 2014-03-01 NOTE — ED Provider Notes (Signed)
CSN: [id number]     Arrival date & time 03/01/14  1203 History   First MD Initiated Contact with Patient 03/01/14 1248     Chief Complaint  Patient presents with  . Hip Pain  . Breast Pain    Patient is a 44 y.o. female presenting with hip pain.  Hip Pain   Pt has history of chronic back troubles.  She has had 2 prior surgeries and several injections.  She is followed at [facility] hospital.  However this past weekend she started having pain in her left arm and left breast.  That is a new issues for her.  It woke her up on sat at 1am.  It got better Sunday am then worse again today.  The pain increases with movement.  It seems to come and go in spasms too.Her Left hand feels a little numb  No fever, no chills , no cough.  .   Past Medical History  Diagnosis Date  . Anxiety   . Depression   . Arthritis   . Osteoarthritis   . Chronic back pain   . Sciatica    Past Surgical History  Procedure Laterality Date  . Appendectomy  2011  . Lumbar laminectomy/decompression microdiscectomy  01/10/2011    Procedure: LUMBAR LAMINECTOMY/DECOMPRESSION MICRODISCECTOMY;  Surgeon: Javier Docker;  Location: [facility];  Service: Orthopedics;  Laterality: N/A;  Decompression L4 - L5  (X-Ray)  . Lumbar laminectomy/decompression microdiscectomy  08/02/2011    Procedure: LUMBAR LAMINECTOMY/DECOMPRESSION MICRODISCECTOMY;  Surgeon: Javier Docker, MD;  Location: [facility];  Service: Orthopedics;  Laterality: N/A;  Re-do Decompression L4-L5  . Back surgery     Family History  Problem Relation Age of Onset  . Heart failure Mother    History  Substance Use Topics  . Smoking status: Former Games developer  . Smokeless tobacco: Never Used  . Alcohol Use: No   OB History    No data available     Review of Systems  All other systems reviewed and are negative.     Allergies  Hydrocodone  Home Medications   Prior to Admission medications   Medication Sig Start Date End Date Taking? Authorizing Provider   Ascorbic Acid (VITAMIN C PO) Take by mouth.   Yes Historical Provider, MD  CALCIUM PO Take 1 tablet by mouth daily.   Yes Historical Provider, MD  clonazePAM (KLONOPIN) 1 MG tablet Take 1 mg by mouth 2 (two) times daily as needed for anxiety.    Yes Historical Provider, MD  CRANBERRY PO Take 1 tablet by mouth daily.   Yes Historical Provider, MD  Gabapentin Enacarbil ER (HORIZANT) 300 MG TBCR Take 1 tablet by mouth 2 (two) times daily.    Yes Historical Provider, MD  Chilton Si Tea, Camillia sinensis, (GREEN TEA PO) Take 1 tablet by mouth 3 (three) times daily.    Yes Historical Provider, MD  HYDROmorphone (DILAUDID) 8 MG tablet Take 8 mg by mouth every 4 (four) hours as needed for severe pain.   Yes Historical Provider, MD  LYSINE PO Take 3 tablets by mouth daily.   Yes Historical Provider, MD  Multiple Vitamin (MULTIVITAMIN WITH MINERALS) TABS tablet Take 1 tablet by mouth daily.   Yes Historical Provider, MD  propranolol (INDERAL) 10 MG tablet Take 10 mg by mouth 2 (two) times daily.   Yes Historical Provider, MD  traZODone (DESYREL) 100 MG tablet Take 200 mg by mouth at bedtime.    Yes Historical Provider, MD  venlafaxine XR (  EFFEXOR-XR) 150 MG 24 hr capsule Take 300 mg by mouth daily with breakfast.   Yes Historical Provider, MD  magnesium oxide (MAG-OX) 400 MG tablet Take 800 mg by mouth every evening.     Historical Provider, MD  methylPREDNISolone (MEDROL DOSEPAK) 4 MG tablet follow package directions Patient not taking: Reported on 03/01/2014 10/20/13   Purvis SheffieldForrest Harrison, MD   BP 149/103 mmHg  Pulse 77  Temp(Src) 97.9 F (36.6 C) (Oral)  Resp 20  SpO2 98% Physical Exam  Constitutional: She appears well-developed and well-nourished. No distress.  HENT:  Head: Normocephalic and atraumatic.  Right Ear: External ear normal.  Left Ear: External ear normal.  Eyes: Conjunctivae are normal. Right eye exhibits no discharge. Left eye exhibits no discharge. No scleral icterus.  Neck: Neck  supple. No tracheal deviation present.  Cardiovascular: Normal rate, regular rhythm and intact distal pulses.   Pulmonary/Chest: Effort normal and breath sounds normal. No stridor. No respiratory distress. She has no wheezes. She has no rales. She exhibits no tenderness.  Abdominal: Soft. Bowel sounds are normal. She exhibits no distension. There is no tenderness. There is no rebound and no guarding.  Musculoskeletal: She exhibits no edema or tenderness.  Neurological: She is alert. She has normal strength. No cranial nerve deficit (no facial droop, extraocular movements intact, no slurred speech) or sensory deficit. She exhibits normal muscle tone. She displays no seizure activity. Coordination normal.  Equal strength in lower extremities, equal grip strength  Skin: Skin is warm and dry. No rash noted.  Psychiatric: She has a normal mood and affect.  Nursing note and vitals reviewed.   ED Course  Procedures (including critical care time) Labs Review Labs Reviewed  CBC - Abnormal; Notable for the following:    WBC 17.0 (*)    All other components within normal limits  COMPREHENSIVE METABOLIC PANEL - Abnormal; Notable for the following:    GFR calc non Af Amer 82 (*)    All other components within normal limits  APTT  PROTIME-INR  D-DIMER, QUANTITATIVE    Imaging Review Dg Chest 2 View  03/01/2014   CLINICAL DATA:  44 year old female with left upper extremity in hip pain with no known injury. Initial encounter.  EXAM: CHEST  2 VIEW  COMPARISON:  Chest radiographs 12/01/2011.  FINDINGS: Improved lung volumes. Normal cardiac size and mediastinal contours. Visualized tracheal air column is within normal limits. No pneumothorax, pulmonary edema, pleural effusion or confluent pulmonary opacity. No acute osseous abnormality identified.  IMPRESSION: No acute cardiopulmonary abnormality.   Electronically Signed   By: Odessa FlemingH  Hall M.D.   On: 03/01/2014 15:10     EKG Interpretation   Date/Time:   Monday March 01 2014 12:33:00 EST Ventricular Rate:  119 PR Interval:  162 QRS Duration: 82 QT Interval:  336 QTC Calculation: 473 R Axis:   71 Text Interpretation:  Fast sinus arrhythmia Consider right atrial  enlargement Low voltage, precordial leads Baseline wander in lead(s) I II  III aVR aVL aVF V1 V2 V3 V4 V5 Since last tracing rate faster Poor data  quality Confirmed by Shavell Nored  MD-J, Artavius Stearns ([id number]) on 03/01/2014 12:36:26 PM      MDM   Final diagnoses:  Radiculopathy of arm    [facility] The patient had a C7-T1 epidural injection performed us month on February.   I suspect her symptoms are related to a radiculopathy.  Doubt ACS, PE, PNA or acute infection.  WBC is  elevated but this is unchanged from several previous results.    Linwood Dibbles, MD 03/01/14 [phone]

## 2014-03-01 NOTE — ED Notes (Signed)
Attempting to perform EKG, patient unable to sit still, patient twitching and moving, chewing gum.

## 2014-03-01 NOTE — ED Notes (Signed)
Patient appears sedated-unable to answer questions r/t birth date, phone number-unable to focus, drowsey

## 2014-03-01 NOTE — ED Notes (Signed)
Bed: [facility] Expected date:  Expected time:  Means of arrival:  Comments: Pt in room 

## 2014-03-01 NOTE — Discharge Instructions (Signed)

## 2014-04-09 HISTORY — PX: HERNIA REPAIR: SHX51

## 2014-04-18 ENCOUNTER — Emergency Department (HOSPITAL_COMMUNITY)
Admission: EM | Admit: 2014-04-18 | Discharge: 2014-04-18 | Disposition: A | Discharge status: Home / Self Care | Payer: Medicare Other | Attending: Emergency Medicine | Admitting: Emergency Medicine

## 2014-04-18 ENCOUNTER — Encounter (HOSPITAL_COMMUNITY): Payer: Self-pay | Admitting: *Deleted

## 2014-04-18 ENCOUNTER — Emergency Department (HOSPITAL_COMMUNITY): Payer: Medicare Other

## 2014-04-18 DIAGNOSIS — M25571 Pain in right ankle and joints of right foot: Secondary | ICD-10-CM | POA: Diagnosis not present

## 2014-04-18 DIAGNOSIS — G8929 Other chronic pain: Secondary | ICD-10-CM | POA: Insufficient documentation

## 2014-04-18 DIAGNOSIS — Z87891 Personal history of nicotine dependence: Secondary | ICD-10-CM | POA: Diagnosis not present

## 2014-04-18 DIAGNOSIS — Z79899 Other long term (current) drug therapy: Secondary | ICD-10-CM | POA: Insufficient documentation

## 2014-04-18 DIAGNOSIS — M791 Myalgia: Secondary | ICD-10-CM | POA: Insufficient documentation

## 2014-04-18 DIAGNOSIS — Z8659 Personal history of other mental and behavioral disorders: Secondary | ICD-10-CM | POA: Insufficient documentation

## 2014-04-18 MED ORDER — KETOROLAC TROMETHAMINE 60 MG/2ML IM SOLN
60.0000 mg | Freq: Once | INTRAMUSCULAR | Status: AC
  Administered 2014-04-18: 60 mg via INTRAMUSCULAR
  Filled 2014-04-18: qty 2

## 2014-04-18 NOTE — ED Provider Notes (Signed)
CSN: [id number]     Arrival date & time 04/18/14  1040 History   First MD Initiated Contact with Patient 04/18/14 1101     Chief Complaint  Patient presents with  . Foot Pain     (Consider location/radiation/quality/duration/timing/severity/associated sxs/prior Treatment) HPI  Judith Hardy is a 44 y.o. female with PMH of osteoarthritis, chronic back pain, sciatica, depression, anxiety presenting with stepping in a large hole with complaint of right foot pain since yesterday. Pain chronic and worse with ambulation and palpation. Patient noted immediately swollen and she put some ice on it. She denies any numbness, tingling, weakness. No fevers or chills nausea vomiting. Patient has taken her chronic pain medications for it with mild relief.   Past Medical History  Diagnosis Date  . Anxiety   . Depression   . Arthritis   . Osteoarthritis   . Chronic back pain   . Sciatica    Past Surgical History  Procedure Laterality Date  . Appendectomy  2011  . Lumbar laminectomy/decompression microdiscectomy  01/10/2011    Procedure: LUMBAR LAMINECTOMY/DECOMPRESSION MICRODISCECTOMY;  Surgeon: Javier Docker;  Location: [facility];  Service: Orthopedics;  Laterality: N/A;  Decompression L4 - L5  (X-Ray)  . Lumbar laminectomy/decompression microdiscectomy  08/02/2011    Procedure: LUMBAR LAMINECTOMY/DECOMPRESSION MICRODISCECTOMY;  Surgeon: Javier Docker, MD;  Location: [facility];  Service: Orthopedics;  Laterality: N/A;  Re-do Decompression L4-L5  . Back surgery     Family History  Problem Relation Age of Onset  . Heart failure Mother    History  Substance Use Topics  . Smoking status: Former Games developer  . Smokeless tobacco: Never Used  . Alcohol Use: No   OB History    No data available     Review of Systems  Musculoskeletal: Positive for myalgias. Negative for joint swelling.  Skin: Negative for color change and wound.  Neurological: Negative for weakness and numbness.      Allergies   Hydrocodone  Home Medications   Prior to Admission medications   Medication Sig Start Date End Date Taking? Authorizing Provider  Ascorbic Acid (VITAMIN C PO) Take by mouth.    Historical Provider, MD  CALCIUM PO Take 1 tablet by mouth daily.    Historical Provider, MD  clonazePAM (KLONOPIN) 1 MG tablet Take 1 mg by mouth 2 (two) times daily as needed for anxiety.     Historical Provider, MD  CRANBERRY PO Take 1 tablet by mouth daily.    Historical Provider, MD  Gabapentin Enacarbil ER (HORIZANT) 300 MG TBCR Take 1 tablet by mouth 2 (two) times daily.     Historical Provider, MD  Chilton Si Tea, Camillia sinensis, (GREEN TEA PO) Take 1 tablet by mouth 3 (three) times daily.     Historical Provider, MD  HYDROmorphone (DILAUDID) 8 MG tablet Take 8 mg by mouth every 4 (four) hours as needed for severe pain.    Historical Provider, MD  LYSINE PO Take 3 tablets by mouth daily.    Historical Provider, MD  magnesium oxide (MAG-OX) 400 MG tablet Take 800 mg by mouth every evening.     Historical Provider, MD  methylPREDNISolone (MEDROL DOSEPAK) 4 MG tablet follow package directions Patient not taking: Reported on 03/01/2014 10/20/13   Purvis Sheffield, MD  Multiple Vitamin (MULTIVITAMIN WITH MINERALS) TABS tablet Take 1 tablet by mouth daily.    Historical Provider, MD  propranolol (INDERAL) 10 MG tablet Take 10 mg by mouth 2 (two) times daily.    Historical Provider,  MD  traZODone (DESYREL) 100 MG tablet Take 200 mg by mouth at bedtime.     Historical Provider, MD  venlafaxine XR (EFFEXOR-XR) 150 MG 24 hr capsule Take 300 mg by mouth daily with breakfast.    Historical Provider, MD   BP 110/65 mmHg  Pulse 64  Temp(Src) 98.5 F (36.9 C) (Oral)  Resp 16  SpO2 97% Physical Exam  Constitutional: She appears well-developed and well-nourished.  HENT:  Head: Normocephalic and atraumatic.  Eyes: Conjunctivae are normal. Right eye exhibits no discharge. Left eye exhibits no discharge.  Pulmonary/Chest:  Effort normal.  Musculoskeletal:  Right ankle: Tenderness around medial malleolus with mild swelling. No ecchymoses, warmth, redness. Full range of motion with tenderness. No tenderness to right proximal fibular head. No tenderness over base of fifth metatarsal or navicular. No tenderness of lateral malleolus. Achilles tendon intact with normal Thompson sign.  Neurological: She is alert. She exhibits normal muscle tone.  Skin: Skin is warm and dry.  Psychiatric: She has a normal mood and affect. Her behavior is normal.  Nursing note and vitals reviewed.   ED Course  Procedures (including critical care time) Labs Review Labs Reviewed - No data to display  Imaging Review Dg Foot Complete Right  04/18/2014   CLINICAL DATA:  Right foot pain 2 days after fall.  EXAM: RIGHT FOOT COMPLETE - 3+ VIEW  COMPARISON:  None.  FINDINGS: There is no evidence of fracture or dislocation. There is no evidence of arthropathy or other focal bone abnormality. Soft tissues are unremarkable.  IMPRESSION: Negative.   Electronically Signed   By: Elberta Fortisaniel  Boyle M.D.   On: 04/18/2014 12:01     EKG Interpretation None      MDM   Final diagnoses:  Ankle pain, right   Patient X-Ray negative for obvious fracture or dislocation. Pain managed in ED. Pt without erythema, edema or warmth to joint. Neurovascularly intact. I doubt septic arthritis. Pt advised to follow up with PCP/orthopedics if symptoms persist. Patient given brace while in ED, RICE, ibuprofen and conservative therapy recommended and discussed.   Discussed return precautions with patient. Discussed all results and patient verbalizes understanding and agrees with plan.    Oswaldo ConroyVictoria Linde Wilensky, PA-C 04/18/14 1222  Rolan BuccoMelanie Belfi, MD 04/18/14 [phone]

## 2014-04-18 NOTE — Discharge Instructions (Signed)
Return to the emergency room with worsening of symptoms, new symptoms or with symptoms that are concerning, especially fevers, redness, swelling, numbness, tingling, weakness. RICE: Rest, Ice (three cycles of 20 mins on, 20mins off at least twice a day), compression/brace, elevation. Heating pad works well for back pain. Ibuprofen 400mg  (2 tablets 200mg ) every 5-6 hours for 3-5 days. Follow up with PCP/orthopedist if symptoms worsen or are persistent. Read below information and follow recommendations. Ankle Sprain An ankle sprain is an injury to the strong, fibrous tissues (ligaments) that hold the bones of your ankle joint together.  CAUSES An ankle sprain is usually caused by a fall or by twisting your ankle. Ankle sprains most commonly occur when you step on the outer edge of your foot, and your ankle turns inward. People who participate in sports are more prone to these types of injuries.  SYMPTOMS   Pain in your ankle. The pain may be present at rest or only when you are trying to stand or walk.  Swelling.  Bruising. Bruising may develop immediately or within 1 to 2 days after your injury.  Difficulty standing or walking, particularly when turning corners or changing directions. DIAGNOSIS  Your caregiver will ask you details about your injury and perform a physical exam of your ankle to determine if you have an ankle sprain. During the physical exam, your caregiver will press on and apply pressure to specific areas of your foot and ankle. Your caregiver will try to move your ankle in certain ways. An X-ray exam may be done to be sure a bone was not broken or a ligament did not separate from one of the bones in your ankle (avulsion fracture).  TREATMENT  Certain types of braces can help stabilize your ankle. Your caregiver can make a recommendation for this. Your caregiver may recommend the use of medicine for pain. If your sprain is severe, your caregiver may refer you to a surgeon who  helps to restore function to parts of your skeletal system (orthopedist) or a physical therapist. HOME CARE INSTRUCTIONS   Apply ice to your injury for 1-2 days or as directed by your caregiver. Applying ice helps to reduce inflammation and pain.  Put ice in a plastic bag.  Place a towel between your skin and the bag.  Leave the ice on for 15-20 minutes at a time, every 2 hours while you are awake.  Only take over-the-counter or prescription medicines for pain, discomfort, or fever as directed by your caregiver.  Elevate your injured ankle above the level of your heart as much as possible for 2-3 days.  If your caregiver recommends crutches, use them as instructed. Gradually put weight on the affected ankle. Continue to use crutches or a cane until you can walk without feeling pain in your ankle.  If you have a plaster splint, wear the splint as directed by your caregiver. Do not rest it on anything harder than a pillow for the first 24 hours. Do not put weight on it. Do not get it wet. You may take it off to take a shower or bath.  You may have been given an elastic bandage to wear around your ankle to provide support. If the elastic bandage is too tight (you have numbness or tingling in your foot or your foot becomes cold and blue), adjust the bandage to make it comfortable.  If you have an air splint, you may blow more air into it or let air out to make it  more comfortable. You may take your splint off at night and before taking a shower or bath. Wiggle your toes in the splint several times per day to decrease swelling. °SEEK MEDICAL CARE IF:  °· You have rapidly increasing bruising or swelling. °· Your toes feel extremely cold or you lose feeling in your foot. °· Your pain is not relieved with medicine. °SEEK IMMEDIATE MEDICAL CARE IF: °· Your toes are numb or blue. °· You have severe pain that is increasing. °MAKE SURE YOU:  °· Understand these instructions. °· Will watch your  condition. °· Will get help right away if you are not doing well or get worse. °Document Released: 12/25/2004 Document Revised: 09/19/2011 Document Reviewed: 01/06/2011 °Exit[employer]® Patient Information ©2015 [employer]. This information is not intended to replace advice given to you by your health care provider. Make sure you discuss any questions you have with your health care provider. ° ° °

## 2014-04-18 NOTE — ED Notes (Signed)
Pt reports she was walking and stepped into a deep hole and fell into hole. Pt occasionally walks with a walker because she has chronic back pain, reports right foot pain 10/10.

## 2014-05-27 ENCOUNTER — Emergency Department (HOSPITAL_COMMUNITY): Payer: Medicare Other

## 2014-05-27 ENCOUNTER — Inpatient Hospital Stay (HOSPITAL_COMMUNITY)
Admission: EM | Admit: 2014-05-27 | Discharge: 2014-06-01 | DRG: 863 | Disposition: A | Discharge status: Home / Self Care | Payer: Medicare Other | Attending: General Surgery | Admitting: General Surgery

## 2014-05-27 ENCOUNTER — Encounter (HOSPITAL_COMMUNITY): Payer: Self-pay

## 2014-05-27 DIAGNOSIS — Y838 Other surgical procedures as the cause of abnormal reaction of the patient, or of later complication, without mention of misadventure at the time of the procedure: Secondary | ICD-10-CM | POA: Diagnosis present

## 2014-05-27 DIAGNOSIS — R109 Unspecified abdominal pain: Secondary | ICD-10-CM

## 2014-05-27 DIAGNOSIS — Z87891 Personal history of nicotine dependence: Secondary | ICD-10-CM

## 2014-05-27 DIAGNOSIS — T814XXA Infection following a procedure, initial encounter: Secondary | ICD-10-CM | POA: Diagnosis present

## 2014-05-27 DIAGNOSIS — D72829 Elevated white blood cell count, unspecified: Secondary | ICD-10-CM | POA: Diagnosis present

## 2014-05-27 DIAGNOSIS — Z885 Allergy status to narcotic agent status: Secondary | ICD-10-CM

## 2014-05-27 DIAGNOSIS — F329 Major depressive disorder, single episode, unspecified: Secondary | ICD-10-CM | POA: Diagnosis present

## 2014-05-27 DIAGNOSIS — Z79891 Long term (current) use of opiate analgesic: Secondary | ICD-10-CM | POA: Diagnosis not present

## 2014-05-27 DIAGNOSIS — F419 Anxiety disorder, unspecified: Secondary | ICD-10-CM | POA: Diagnosis present

## 2014-05-27 DIAGNOSIS — G8918 Other acute postprocedural pain: Secondary | ICD-10-CM | POA: Diagnosis present

## 2014-05-27 DIAGNOSIS — M1990 Unspecified osteoarthritis, unspecified site: Secondary | ICD-10-CM | POA: Diagnosis present

## 2014-05-27 LAB — BASIC METABOLIC PANEL
ANION GAP: 12 (ref 5–15)
BUN: 10 mg/dL (ref 6–20)
CHLORIDE: 99 mmol/L — AB (ref 101–111)
CO2: 25 mmol/L (ref 22–32)
Calcium: 9.5 mg/dL (ref 8.9–10.3)
Creatinine, Ser: 0.85 mg/dL (ref 0.44–1.00)
Glucose, Bld: 148 mg/dL — ABNORMAL HIGH (ref 65–99)
Potassium: 3.6 mmol/L (ref 3.5–5.1)
Sodium: 136 mmol/L (ref 135–145)

## 2014-05-27 LAB — CBC
HCT: 43.1 % (ref 36.0–46.0)
HEMOGLOBIN: 14.2 g/dL (ref 12.0–15.0)
MCH: 32.3 pg (ref 26.0–34.0)
MCHC: 32.9 g/dL (ref 30.0–36.0)
MCV: 98.2 fL (ref 78.0–100.0)
Platelets: 217 10*3/uL (ref 150–400)
RBC: 4.39 MIL/uL (ref 3.87–5.11)
RDW: 13.2 % (ref 11.5–15.5)
WBC: 14.5 10*3/uL — ABNORMAL HIGH (ref 4.0–10.5)

## 2014-05-27 MED ORDER — PROPRANOLOL HCL 10 MG PO TABS
10.0000 mg | ORAL_TABLET | Freq: Two times a day (BID) | ORAL | Status: DC
  Administered 2014-05-27 – 2014-05-31 (×8): 10 mg via ORAL
  Filled 2014-05-27 (×11): qty 1

## 2014-05-27 MED ORDER — ENOXAPARIN SODIUM 30 MG/0.3ML ~~LOC~~ SOLN
30.0000 mg | Freq: Two times a day (BID) | SUBCUTANEOUS | Status: DC
  Administered 2014-05-27 – 2014-06-01 (×10): 30 mg via SUBCUTANEOUS
  Filled 2014-05-27 (×11): qty 0.3

## 2014-05-27 MED ORDER — TRAZODONE HCL 100 MG PO TABS
200.0000 mg | ORAL_TABLET | Freq: Every day | ORAL | Status: DC
  Administered 2014-05-27 – 2014-05-31 (×5): 200 mg via ORAL
  Filled 2014-05-27 (×6): qty 2

## 2014-05-27 MED ORDER — DEXTROSE IN LACTATED RINGERS 5 % IV SOLN
INTRAVENOUS | Status: DC
  Administered 2014-05-27 – 2014-05-29 (×2): via INTRAVENOUS
  Administered 2014-05-30: 50 mL/h via INTRAVENOUS
  Administered 2014-05-31: 05:00:00 via INTRAVENOUS
  Administered 2014-05-31: 50 mL/h via INTRAVENOUS

## 2014-05-27 MED ORDER — IOHEXOL 300 MG/ML  SOLN
50.0000 mL | Freq: Once | INTRAMUSCULAR | Status: AC | PRN
  Administered 2014-05-27: 50 mL via ORAL

## 2014-05-27 MED ORDER — IOHEXOL 300 MG/ML  SOLN
100.0000 mL | Freq: Once | INTRAMUSCULAR | Status: AC | PRN
  Administered 2014-05-27: 100 mL via INTRAVENOUS

## 2014-05-27 MED ORDER — GABAPENTIN 300 MG PO CAPS
300.0000 mg | ORAL_CAPSULE | Freq: Two times a day (BID) | ORAL | Status: DC
  Administered 2014-05-27 – 2014-06-01 (×10): 300 mg via ORAL
  Filled 2014-05-27 (×11): qty 1

## 2014-05-27 MED ORDER — OXYCODONE HCL 5 MG PO TABS
10.0000 mg | ORAL_TABLET | ORAL | Status: DC | PRN
  Administered 2014-05-27 – 2014-05-29 (×9): 10 mg via ORAL
  Filled 2014-05-27 (×9): qty 2

## 2014-05-27 MED ORDER — ADULT MULTIVITAMIN W/MINERALS CH
1.0000 | ORAL_TABLET | Freq: Every day | ORAL | Status: DC
  Administered 2014-05-28 – 2014-06-01 (×5): 1 via ORAL
  Filled 2014-05-27 (×5): qty 1

## 2014-05-27 MED ORDER — ONDANSETRON HCL 4 MG/2ML IJ SOLN: 4.0000 mg | Freq: Four times a day (QID) | INTRAMUSCULAR | Status: DC | PRN

## 2014-05-27 MED ORDER — GABAPENTIN ENACARBIL ER 300 MG PO TBCR: 300.0000 mg | EXTENDED_RELEASE_TABLET | Freq: Two times a day (BID) | ORAL | Status: DC

## 2014-05-27 MED ORDER — OXYCODONE HCL 5 MG PO TABS
10.0000 mg | ORAL_TABLET | Freq: Once | ORAL | Status: AC
  Administered 2014-05-27: 10 mg via ORAL
  Filled 2014-05-27: qty 2

## 2014-05-27 MED ORDER — VENLAFAXINE HCL ER 150 MG PO CP24
300.0000 mg | ORAL_CAPSULE | Freq: Every day | ORAL | Status: DC
  Administered 2014-05-28 – 2014-06-01 (×5): 300 mg via ORAL
  Filled 2014-05-27 (×5): qty 2

## 2014-05-27 MED ORDER — CLONAZEPAM 1 MG PO TABS
1.0000 mg | ORAL_TABLET | Freq: Two times a day (BID) | ORAL | Status: DC | PRN
  Administered 2014-05-28 – 2014-06-01 (×8): 1 mg via ORAL
  Filled 2014-05-27: qty 2
  Filled 2014-05-27 (×2): qty 1
  Filled 2014-05-27 (×4): qty 2
  Filled 2014-05-27 (×2): qty 1

## 2014-05-27 MED ORDER — POLYETHYLENE GLYCOL 3350 17 G PO PACK: 17.0000 g | PACK | Freq: Every day | ORAL | Status: DC | PRN

## 2014-05-27 MED ORDER — KETOROLAC TROMETHAMINE 30 MG/ML IJ SOLN
30.0000 mg | Freq: Three times a day (TID) | INTRAMUSCULAR | Status: AC | PRN
  Administered 2014-05-27 – 2014-05-28 (×2): 30 mg via INTRAVENOUS
  Filled 2014-05-27 (×2): qty 1

## 2014-05-27 NOTE — ED Notes (Signed)
Patient transported to CT 

## 2014-05-27 NOTE — H&P (Signed)
Jaz Fayrene FearingJames is an 44 y.o. female.    Chief Complaint: abdominal pain, blacking out  HPI: Patient is a 44 year old female 3 weeks status post repair of a small umbilical/incisional hernia as an outpatient with an intra-abdominal mesh. Her situation is complicated by chronic back pain on chronic narcotics. She states she had quite a lot of pain initially but seemed to get better until about a week ago and now has been having increasing pain at her umbilicus. She describes sharp pain right at the incision with some radiation toward the lower abdomen. This is worse with any motion or activity. She denies fever or chills. No nausea or vomiting. She states she has not been eating well because she can't get up to get food. She has family at home but apparently gets very little assistance. She states a couple of times this week when trying to get up and move around she has had brief blackouts and is worried that she is going to fall and hurt herself and therefore is not getting up. She has had no redness or drainage or swelling from the incision.  Past Medical History  Diagnosis Date  . Anxiety   . Depression   . Arthritis   . Osteoarthritis   . Chronic back pain   . Sciatica     Past Surgical History  Procedure Laterality Date  . Appendectomy  2011  . Lumbar laminectomy/decompression microdiscectomy  01/10/2011    Procedure: LUMBAR LAMINECTOMY/DECOMPRESSION MICRODISCECTOMY;  Surgeon: [name];  Location: [facility];  Service: Orthopedics;  Laterality: N/A;  Decompression L4 - L5  (X-Ray)  . Lumbar laminectomy/decompression microdiscectomy  08/02/2011    Procedure: LUMBAR LAMINECTOMY/DECOMPRESSION MICRODISCECTOMY;  Surgeon: [name], MD;  Location: [facility];  Service: Orthopedics;  Laterality: N/A;  Re-do Decompression L4-L5  . Back surgery    . Hernia repair      Family History  Problem Relation Age of Onset  . Heart failure Mother    Social History:  reports that she has quit smoking.  She has never used smokeless tobacco. She reports that she does not drink alcohol or use illicit drugs.  Allergies:  Allergies  Allergen Reactions  . Hydrocodone Itching     (Not in a hospital admission)  Results for orders placed or performed during the hospital encounter of 05/27/14 (from the past 48 hour(s))  CBC     Status: Abnormal   Collection Time: 05/27/14  2:53 PM  Result Value Ref Range   WBC 14.5 (H) 4.0 - 10.5 K/uL   RBC 4.39 3.87 - 5.11 MIL/uL   Hemoglobin 14.2 12.0 - 15.0 g/dL   HCT 62.143.1 30.836.0 - 65.746.0 %   MCV 98.2 78.0 - 100.0 fL   MCH 32.3 26.0 - 34.0 pg   MCHC 32.9 30.0 - 36.0 g/dL   RDW 84.613.2 96.211.5 - 95.215.5 %   Platelets 217 150 - 400 K/uL   Ct Abdomen Pelvis W Contrast  05/27/2014   CLINICAL DATA:  Lower abdominal and back pain for 1.5 weeks. Status post surgery for umbilical hernia 3 weeks ago.  EXAM: CT ABDOMEN AND PELVIS WITH CONTRAST  TECHNIQUE: Multidetector CT imaging of the abdomen and pelvis was performed using the standard protocol following bolus administration of intravenous contrast.  CONTRAST:  50 mL OMNIPAQUE IOHEXOL 300 MG/ML SOLN, 100 mL OMNIPAQUE IOHEXOL 300 MG/ML SOLN  COMPARISON:  CT abdomen and pelvis 10/20/2013 and 12/30/2009.  FINDINGS: The lung bases are clear. No pleural or pericardial effusion.  Small hiatal hernia is noted.  The gallbladder, liver, spleen, adrenal glands, pancreas and kidneys all appear normal.  Since the prior examinations, the patient has undergone repair of an umbilical hernia. There is a small fluid collection measuring 5.9 cm craniocaudal by 3.2 cm transverse by 1.3 cm AP in a left paramidline position along the linea alba. No gas within the collection is identified and it has only a thin rim. It likely represents a postoperative seroma. No intra-abdominal or pelvic fluid collection is seen.  IUD is in place in the uterus. Adnexa and urinary bladder are unremarkable. The small and large bowel and appendix appear normal. No  lymphadenopathy is identified.  IMPRESSION: Status post umbilical hernia repair. A small fluid collection to the left of midline along the linea alba is likely a postoperative seroma. Abscess is possible but thought less likely.  Small hiatal hernia.   Electronically Signed   By: Drusilla Kannerhomas  Dalessio M.D.   On: 05/27/2014 16:52    Review of Systems  Constitutional: Negative for fever and chills.  Respiratory: Negative for shortness of breath.   Cardiovascular: Negative for chest pain and palpitations.  Gastrointestinal: Positive for abdominal pain. Negative for nausea, vomiting, diarrhea, constipation and blood in stool.  Genitourinary: Negative.   Musculoskeletal: Positive for back pain.  Neurological: Positive for dizziness.    Blood pressure 125/70, pulse 72, temperature 98.1 F (36.7 C), temperature source Oral, resp. rate 18, SpO2 100 %. Physical Exam  General: alert but uncomfortable-appearing African American female Skin: Warm and dry without rash or infection. HEENT: No palpable masses or thyromegaly. Sclera nonicteric. Pupils equal round and reactive. Oropharynx clear. Lymph nodes: No cervical, supraclavicular, or inguinal nodes palpable. Lungs: Breath sounds clear and equal without increased work of breathing Cardiovascular: Regular rate and rhythm without murmur. No JVD or edema.  Abdomen: Nondistended. Generally soft and nontender. There is a 2.5 cm infraumbilical incision which appears to be healing well without swelling or erythema or drainage. There is quite a bit of tenderness around the incision and the umbilicus. No mass or evidence of recurrence. Extremities: No edema or joint swelling or deformity. No chronic venous stasis changes. Neurologic: Alert and fully oriented.    Assessment/Plan Status post umbilical hernia repair with increasing pain. She has a moderate leukocytosis. There is no evidence of infection examining the operative site. CT scan shows a small fluid  collection around the mesh which would not be unexpected at this early postoperative stage and likely represents a seroma. However, infection around the mesh or early abscess cannot be completely ruled out. After discussion with the patient regarding options for treatment of follow-up she feels that she is unable to function at home and is a risk of falling and injuring herself. She will be admitted for initially close observation and repeat white blood count tomorrow morning. If she continues to have leukocytosis would consider empiric IV antibiotics.  Natosha Bou T 05/27/2014, 5:25 PM

## 2014-05-27 NOTE — ED Notes (Signed)
Pt continually asking for pain medication.  Staff made Pt aware that nothing is ordered and [place] MD is aware that she is in pain.

## 2014-05-27 NOTE — ED Notes (Signed)
Bed: [id number] Expected date:  Expected time:  Means of arrival:  Comments: EMS umbilical hernia

## 2014-05-27 NOTE — ED Notes (Addendum)
Pt c/o abdominal pain and low back pain x 3 weeks.  Pain score 10/10.  Pt reports taking OTC pain medications w/o relief.  Pt had hernia surgery x 3 weeks ago.  Hx of chronic back pain.  Pt reports that she has not been taking prescribed pain medication, because she "thought it was making the pain worse."

## 2014-05-27 NOTE — ED Notes (Addendum)
Per EMS-patient had surgery on umbilical surgery 3 weeks ago-no problems from surgery-c/o pain around surgical site for past 3 weeks-surgeon told her to come here for eval-patient states she ran out of pain meds 1 week ago-takes dilaudid for chronic back pain-per EMS, patient is sedated and showing no s/s's of distress

## 2014-05-27 NOTE — ED Notes (Signed)
Hoxworth MD at bedside.

## 2014-05-28 ENCOUNTER — Inpatient Hospital Stay (HOSPITAL_COMMUNITY): Payer: Medicare Other

## 2014-05-28 LAB — URINALYSIS, ROUTINE W REFLEX MICROSCOPIC
Bilirubin Urine: NEGATIVE
GLUCOSE, UA: NEGATIVE mg/dL
Hgb urine dipstick: NEGATIVE
Ketones, ur: NEGATIVE mg/dL
Leukocytes, UA: NEGATIVE
NITRITE: NEGATIVE
PH: 6 (ref 5.0–8.0)
Protein, ur: NEGATIVE mg/dL
Specific Gravity, Urine: 1.019 (ref 1.005–1.030)
Urobilinogen, UA: 0.2 mg/dL (ref 0.0–1.0)

## 2014-05-28 LAB — CBC
HCT: 38.4 % (ref 36.0–46.0)
Hemoglobin: 12.4 g/dL (ref 12.0–15.0)
MCH: 31.7 pg (ref 26.0–34.0)
MCHC: 32.3 g/dL (ref 30.0–36.0)
MCV: 98.2 fL (ref 78.0–100.0)
Platelets: 285 10*3/uL (ref 150–400)
RBC: 3.91 MIL/uL (ref 3.87–5.11)
RDW: 13.3 % (ref 11.5–15.5)
WBC: 16.8 10*3/uL — ABNORMAL HIGH (ref 4.0–10.5)

## 2014-05-28 MED ORDER — MORPHINE SULFATE 4 MG/ML IJ SOLN
4.0000 mg | Freq: Three times a day (TID) | INTRAMUSCULAR | Status: DC | PRN
  Administered 2014-05-28 – 2014-06-01 (×9): 4 mg via INTRAVENOUS
  Filled 2014-05-28 (×10): qty 1

## 2014-05-28 MED ORDER — PIPERACILLIN-TAZOBACTAM 3.375 G IVPB
3.3750 g | Freq: Three times a day (TID) | INTRAVENOUS | Status: DC
  Administered 2014-05-28 – 2014-06-01 (×13): 3.375 g via INTRAVENOUS
  Filled 2014-05-28 (×14): qty 50

## 2014-05-28 NOTE — Progress Notes (Signed)
05/28/14 0919  Walked in room found patient at sink washing up. Patient is unsteady. Asked patient to go back to bed and staff will help her with bath. Patient refuses to go back to bed. Patient knows she is unsteady and almost fell back while i was in room.

## 2014-05-28 NOTE — Progress Notes (Signed)
Patient ID: Judith Hardy, female   DOB: 12/21/1970, 44 y.o.   MRN: [id number]    Subjective: Pain at umbilicus unchanged  Objective: Vital signs in last 24 hours: Temp:  [97.2 F (36.2 C)-98.3 F (36.8 C)] 98.3 F (36.8 C) (05/20 0630) Pulse Rate:  [66-93] 73 (05/20 0630) Resp:  [16-18] 18 (05/20 0630) BP: (97-172)/(50-102) 101/56 mmHg (05/20 0630) SpO2:  [96 %-100 %] 100 % (05/20 0630) Weight:  [99.338 kg (219 lb)] 99.338 kg (219 lb) (05/20 0600) Last BM Date: 05/27/14  Intake/Output from previous day: 05/19 0701 - 05/20 0700 In: 1914.5 [P.O.:1377; I.V.:537.5] Out: 400 [Urine:400] Intake/Output this shift:    General appearance: alert, cooperative and no distress Incision/Wound: Tender but clean and dry without erythema or drainage of swelling  Lab Results:   Recent Labs  05/27/14 1453 05/28/14 0449  WBC 14.5* 16.8*  HGB 14.2 12.4  HCT 43.1 38.4  PLT 217 285   BMET  Recent Labs  05/27/14 1940  NA 136  K 3.6  CL 99*  CO2 25  GLUCOSE 148*  BUN 10  CREATININE 0.85  CALCIUM 9.5     Studies/Results: Ct Abdomen Pelvis W Contrast  05/27/2014   CLINICAL DATA:  Lower abdominal and back pain for 1.5 weeks. Status post surgery for umbilical hernia 3 weeks ago.  EXAM: CT ABDOMEN AND PELVIS WITH CONTRAST  TECHNIQUE: Multidetector CT imaging of the abdomen and pelvis was performed using the standard protocol following bolus administration of intravenous contrast.  CONTRAST:  50 mL OMNIPAQUE IOHEXOL 300 MG/ML SOLN, 100 mL OMNIPAQUE IOHEXOL 300 MG/ML SOLN  COMPARISON:  CT abdomen and pelvis 10/20/2013 and 12/30/2009.  FINDINGS: The lung bases are clear. No pleural or pericardial effusion. Small hiatal hernia is noted.  The gallbladder, liver, spleen, adrenal glands, pancreas and kidneys all appear normal.  Since the prior examinations, the patient has undergone repair of an umbilical hernia. There is a small fluid collection measuring 5.9 cm craniocaudal by 3.2 cm transverse  by 1.3 cm AP in a left paramidline position along the linea alba. No gas within the collection is identified and it has only a thin rim. It likely represents a postoperative seroma. No intra-abdominal or pelvic fluid collection is seen.  IUD is in place in the uterus. Adnexa and urinary bladder are unremarkable. The small and large bowel and appendix appear normal. No lymphadenopathy is identified.  IMPRESSION: Status post umbilical hernia repair. A small fluid collection to the left of midline along the linea alba is likely a postoperative seroma. Abscess is possible but thought less likely.  Small hiatal hernia.   Electronically Signed   By: Drusilla Kannerhomas  Dalessio M.D.   On: 05/27/2014 16:52    Anti-infectives: Anti-infectives    Start     Dose/Rate Route Frequency Ordered Stop   05/28/14 0800  piperacillin-tazobactam (ZOSYN) IVPB 3.375 g     3.375 g 12.5 mL/hr over 240 Minutes Intravenous 3 times per day 05/28/14 0717        Assessment/Plan: Increased pain p UH repair with increasing WBC, small amt fluid at mesh of uncertain significance.  Possible infection.  Will start IV Zosyn and observe     LOS: 1 day    Destyn Parfitt T 05/28/2014

## 2014-05-29 LAB — CBC
HCT: 36.9 % (ref 36.0–46.0)
Hemoglobin: 11.6 g/dL — ABNORMAL LOW (ref 12.0–15.0)
MCH: 31.2 pg (ref 26.0–34.0)
MCHC: 31.4 g/dL (ref 30.0–36.0)
MCV: 99.2 fL (ref 78.0–100.0)
Platelets: 254 10*3/uL (ref 150–400)
RBC: 3.72 MIL/uL — ABNORMAL LOW (ref 3.87–5.11)
RDW: 13.3 % (ref 11.5–15.5)
WBC: 14.7 10*3/uL — ABNORMAL HIGH (ref 4.0–10.5)

## 2014-05-29 MED ORDER — OXYCODONE HCL 5 MG PO TABS
10.0000 mg | ORAL_TABLET | Freq: Once | ORAL | Status: AC
  Administered 2014-05-29: 10 mg via ORAL
  Filled 2014-05-29: qty 2

## 2014-05-29 MED ORDER — OXYCODONE HCL 5 MG PO TABS
10.0000 mg | ORAL_TABLET | ORAL | Status: DC | PRN
  Administered 2014-05-29 – 2014-06-01 (×15): 15 mg via ORAL
  Filled 2014-05-29 (×15): qty 3

## 2014-05-29 NOTE — Progress Notes (Signed)
Patient ID: Judith PatrickSetaria Hardy, female   DOB: 07/09/1970, 44 y.o.   MRN: [id number]    Subjective: Pain is at best a 7.  WBCs finally down.  No fevers/chills.    Objective: Vital signs in last 24 hours: Temp:  [98.1 F (36.7 C)-98.7 F (37.1 C)] 98.6 F (37 C) (05/21 0640) Pulse Rate:  [61-88] 69 (05/21 0640) Resp:  [15-18] 18 (05/21 0640) BP: (106-121)/(53-64) 106/64 mmHg (05/21 0640) SpO2:  [97 %-100 %] 98 % (05/21 0640) Last BM Date: 05/27/14  Intake/Output from previous day: 05/20 0701 - 05/21 0700 In: 1610 [P.O.:960; I.V.:550; IV Piggyback:100] Out: 1825 [Urine:1825] Intake/Output this shift:    General appearance: alert, cooperative and no distress Incision/Wound: Tender but clean and dry without erythema or drainage of swelling  Lab Results:   Recent Labs  05/28/14 0449 05/29/14 0504  WBC 16.8* 14.7*  HGB 12.4 11.6*  HCT 38.4 36.9  PLT 285 254   BMET  Recent Labs  05/27/14 1940  NA 136  K 3.6  CL 99*  CO2 25  GLUCOSE 148*  BUN 10  CREATININE 0.85  CALCIUM 9.5     Studies/Results: Dg Chest 2 View  05/28/2014   CLINICAL DATA:  44 year old female with leukocytosis and weakness. Initial encounter.  EXAM: CHEST  2 VIEW  COMPARISON:  03/01/2014 and earlier.  FINDINGS: Lower lung volumes. Diffuse increased interstitial pulmonary opacity. No pleural effusion. No pneumothorax or other confluent pulmonary opacity. Stable mediastinal contours allowing for lower lung volumes. Oral contrast in normal appearing left colon. No acute osseous abnormality identified.  IMPRESSION: Lower lung volumes with diffuse increased interstitial opacity which could reflect acute viral or atypical respiratory infection in this setting, versus atelectasis.   Electronically Signed   By: Odessa FlemingH  Hall M.D.   On: 05/28/2014 11:11   Ct Abdomen Pelvis W Contrast  05/27/2014   CLINICAL DATA:  Lower abdominal and back pain for 1.5 weeks. Status post surgery for umbilical hernia 3 weeks ago.  EXAM: CT  ABDOMEN AND PELVIS WITH CONTRAST  TECHNIQUE: Multidetector CT imaging of the abdomen and pelvis was performed using the standard protocol following bolus administration of intravenous contrast.  CONTRAST:  50 mL OMNIPAQUE IOHEXOL 300 MG/ML SOLN, 100 mL OMNIPAQUE IOHEXOL 300 MG/ML SOLN  COMPARISON:  CT abdomen and pelvis 10/20/2013 and 12/30/2009.  FINDINGS: The lung bases are clear. No pleural or pericardial effusion. Small hiatal hernia is noted.  The gallbladder, liver, spleen, adrenal glands, pancreas and kidneys all appear normal.  Since the prior examinations, the patient has undergone repair of an umbilical hernia. There is a small fluid collection measuring 5.9 cm craniocaudal by 3.2 cm transverse by 1.3 cm AP in a left paramidline position along the linea alba. No gas within the collection is identified and it has only a thin rim. It likely represents a postoperative seroma. No intra-abdominal or pelvic fluid collection is seen.  IUD is in place in the uterus. Adnexa and urinary bladder are unremarkable. The small and large bowel and appendix appear normal. No lymphadenopathy is identified.  IMPRESSION: Status post umbilical hernia repair. A small fluid collection to the left of midline along the linea alba is likely a postoperative seroma. Abscess is possible but thought less likely.  Small hiatal hernia.   Electronically Signed   By: Drusilla Kannerhomas  Dalessio M.D.   On: 05/27/2014 16:52    Anti-infectives: Anti-infectives    Start     Dose/Rate Route Frequency Ordered Stop   05/28/14 0800  piperacillin-tazobactam (ZOSYN) IVPB 3.375 g     3.375 g 12.5 mL/hr over 240 Minutes Intravenous 3 times per day 05/28/14 0717        Assessment/Plan: Increase PO oxycodone.   Diet as tolerated. Recheck WBCs tomorrow.     LOS: 2 days    [facility] 05/29/2014

## 2014-05-30 LAB — CBC WITH DIFFERENTIAL/PLATELET
Basophils Absolute: 0.1 10*3/uL (ref 0.0–0.1)
Basophils Relative: 0 % (ref 0–1)
Eosinophils Absolute: 0.5 10*3/uL (ref 0.0–0.7)
Eosinophils Relative: 4 % (ref 0–5)
HCT: 36.8 % (ref 36.0–46.0)
Hemoglobin: 11.6 g/dL — ABNORMAL LOW (ref 12.0–15.0)
LYMPHS PCT: 39 % (ref 12–46)
Lymphs Abs: 5.5 10*3/uL — ABNORMAL HIGH (ref 0.7–4.0)
MCH: 31.7 pg (ref 26.0–34.0)
MCHC: 31.5 g/dL (ref 30.0–36.0)
MCV: 100.5 fL — AB (ref 78.0–100.0)
MONO ABS: 0.7 10*3/uL (ref 0.1–1.0)
Monocytes Relative: 5 % (ref 3–12)
Neutro Abs: 7.5 10*3/uL (ref 1.7–7.7)
Neutrophils Relative %: 53 % (ref 43–77)
Platelets: 273 10*3/uL (ref 150–400)
RBC: 3.66 MIL/uL — AB (ref 3.87–5.11)
RDW: 13.5 % (ref 11.5–15.5)
WBC: 14.3 10*3/uL — AB (ref 4.0–10.5)

## 2014-05-30 NOTE — Progress Notes (Signed)
Patient ID: Judith Hardy, female   DOB: 05/08/1970, 44 y.o.   MRN: [id number]    Subjective: Feels "overwhelmed" this morning because of difficulty with IV sticks but now has one that is working. Overall she feels that her umbilical pain is improving although still gets bad at times.  Objective: Vital signs in last 24 hours: Temp:  [98 F (36.7 C)-98.5 F (36.9 C)] 98 F (36.7 C) (05/22 0603) Pulse Rate:  [69-71] 71 (05/22 0903) Resp:  [20-22] 20 (05/22 0603) BP: (99-128)/(56-73) 99/56 mmHg (05/22 0903) SpO2:  [100 %] 100 % (05/22 0603) Last BM Date: 05/29/14  Intake/Output from previous day: 05/21 0701 - 05/22 0700 In: 4380 [P.O.:2280; I.V.:1850; IV Piggyback:250] Out: 3500 [Urine:3500] Intake/Output this shift:    General appearance: alert, cooperative and no distress GI: tender around the umbilicus  but somewhat less so that admission Incision/Wound: remains without erythema or swelling or drainage  Lab Results:   Recent Labs  05/29/14 0504 05/30/14 0446  WBC 14.7* 14.3*  HGB 11.6* 11.6*  HCT 36.9 36.8  PLT 254 273   BMET  Recent Labs  05/27/14 1940  NA 136  K 3.6  CL 99*  CO2 25  GLUCOSE 148*  BUN 10  CREATININE 0.85  CALCIUM 9.5     Studies/Results: No results found.  Anti-infectives: Anti-infectives    Start     Dose/Rate Route Frequency Ordered Stop   05/28/14 0800  piperacillin-tazobactam (ZOSYN) IVPB 3.375 g     3.375 g 12.5 mL/hr over 240 Minutes Intravenous 3 times per day 05/28/14 [id number]        Assessment/Plan: Status post umbilical hernia repair with mesh several weeks ago presenting with increased pain, small nonspecific fluid collection around the mesh and elevated white count. Possible early infection. On IV antibiotics and clinically some improvement and white blood count decreasing. Continue current therapy for now. Consider aspiration of the fluid if she fails to continue to improve. All this discussed with her and questions  answered.     LOS: 3 days    Ziaire Hagos T 05/30/2014

## 2014-05-31 LAB — CBC
HCT: 39.4 % (ref 36.0–46.0)
Hemoglobin: 12.7 g/dL (ref 12.0–15.0)
MCH: 32.3 pg (ref 26.0–34.0)
MCHC: 32.2 g/dL (ref 30.0–36.0)
MCV: 100.3 fL — AB (ref 78.0–100.0)
PLATELETS: 300 10*3/uL (ref 150–400)
RBC: 3.93 MIL/uL (ref 3.87–5.11)
RDW: 13.4 % (ref 11.5–15.5)
WBC: 14.6 10*3/uL — ABNORMAL HIGH (ref 4.0–10.5)

## 2014-05-31 NOTE — Progress Notes (Signed)
Pt's BR call light alerted NT and nurse to go to room. Pt found on left side lying in floor of BR partially under sink. Pt stated "I was using the toilet and when I got up I felt dizzy and went down." Pt first reported hitting left side of head to NT and told nurse " I think I hit my chin." Both areas assessed with no marks, bumps, bruising or skin breaks noted. C/o back pain.Wearing abd binder. Pt able to get up out of floor with assist of nurse and NT. Pt assisted to bed. Vital signs taken. Temp 97.4  HR 94  Resp 18  BP 139/82  O2 sats 100%. No distress noted. Analgesic obtained for back pain. Charge Nurse and Dagmar HaitAnn Councilman-director  made aware of fall. Dr Johna SheriffHoxworth paged.

## 2014-05-31 NOTE — Progress Notes (Signed)
Patient ID: Judith Hardy, female   DOB: 03/18/1970, 44 y.o.   MRN: [id number]    Subjective: Happily, she feels much better today. Decreased pain at the umbilicus. She is somewhat upset at enteric isolation and on questioning she has had her normal soft small bowel movements and no diarrhea.  Objective: Vital signs in last 24 hours: Temp:  [98.2 F (36.8 C)-98.5 F (36.9 C)] 98.2 F (36.8 C) (05/23 0521) Pulse Rate:  [61-73] 67 (05/23 0521) Resp:  [16-22] 16 (05/23 0521) BP: (108-142)/(60-72) 108/60 mmHg (05/23 0521) SpO2:  [100 %] 100 % (05/23 0521) Last BM Date: 05/30/14  Intake/Output from previous day: 05/22 0701 - 05/23 0700 In: 2250 [P.O.:900; I.V.:1200; IV Piggyback:150] Out: 2650 [Urine:2650] Intake/Output this shift:    General appearance: alert, cooperative and no distress Incision/Wound:incision clean and dry without erythema or swelling or drainage. Quite a bit less tenderness around the incision, now minimal  Lab Results:   Recent Labs  05/30/14 0446 05/31/14 0508  WBC 14.3* 14.6*  HGB 11.6* 12.7  HCT 36.8 39.4  PLT 273 300   BMET No results for input(s): NA, K, CL, CO2, GLUCOSE, BUN, CREATININE, CALCIUM in the last 72 hours.   Studies/Results: No results found.  Anti-infectives: Anti-infectives    Start     Dose/Rate Route Frequency Ordered Stop   05/28/14 0800  piperacillin-tazobactam (ZOSYN) IVPB 3.375 g     3.375 g 12.5 mL/hr over 240 Minutes Intravenous 3 times per day 05/28/14 [id number]        Assessment/Plan: Postoperative pain and umbilical hernia site with possible infection, leukocytosis and nondescript fluid around the mesh. Clinically she is much improved although still has moderate leukocytosis. As she is feeling so much better I would not aspirate the fluid at this point. Continue IV antibiotics today and repeat CBC tomorrow and possibly home soon  To follow-up on oral antibiotics. If she worsens we could proceed with aspiration. She is not  having diarrhea, states her bowel movements are soft and normal for her and no diarrhea has been documented. I will DC enteric precautions.     LOS: 4 days    Judith Hardy T 05/31/2014

## 2014-05-31 NOTE — Progress Notes (Signed)
Pt reported to me that she had 3 loose stools today. Per protocol, I put her on Cdiff precautions, and sent a sample to the lab. Explained the policy to the pt. She became concerned that she was on isolation and did not want to be on it. I assured her that is the test results came back negative, we would discontinue isolation as soon as possible. She voiced understanding.

## 2014-05-31 NOTE — Progress Notes (Signed)
Pt reassessed post fall. Back pain better. No marks, bumps, bruising nor skin breaks noted on lt side of head or chin. Denied headache. VSS. Resting quietly. Reminded to call for assistance as needed. Bed alarms on.

## 2014-05-31 NOTE — Progress Notes (Signed)
Dr. Johna SheriffHoxworth aware via phone of pt's recent fall. VS stable and reported to MD as well as pt's current status. Back pain decreased since med given and resting quietly in bed. No new orders received.

## 2014-06-01 LAB — CBC
HEMATOCRIT: 38.1 % (ref 36.0–46.0)
HEMOGLOBIN: 12 g/dL (ref 12.0–15.0)
MCH: 31.6 pg (ref 26.0–34.0)
MCHC: 31.5 g/dL (ref 30.0–36.0)
MCV: 100.3 fL — AB (ref 78.0–100.0)
Platelets: 270 10*3/uL (ref 150–400)
RBC: 3.8 MIL/uL — ABNORMAL LOW (ref 3.87–5.11)
RDW: 13.3 % (ref 11.5–15.5)
WBC: 14.7 10*3/uL — ABNORMAL HIGH (ref 4.0–10.5)

## 2014-06-01 MED ORDER — AMOXICILLIN-POT CLAVULANATE 875-125 MG PO TABS: 1.0000 | ORAL_TABLET | Freq: Two times a day (BID) | ORAL | Status: DC

## 2014-06-01 MED ORDER — OXYCODONE HCL 10 MG PO TABS: 10.0000 mg | ORAL_TABLET | ORAL | Status: DC | PRN

## 2014-06-01 NOTE — Progress Notes (Signed)
Discharge instructions and information provided. Questions answered, patient verbalized understanding. Discharged via wheelchair to private vehicle.

## 2014-06-01 NOTE — Discharge Summary (Signed)
Patient ID: Judith Hardy [id number] 44 y.o. 09/07/1970  05/27/2014  Discharge date and time: 06/01/2014   Admitting Physician: Glenna FellowsHOXWORTH,Gilbert Narain T  Discharge Physician: Glenna FellowsHOXWORTH,Jalaiyah Throgmorton T  Admission Diagnoses: Abdominal pain [R10.9]  Status post umbilical hernia repair  Discharge Diagnoses: same  Operations: none  Admission Condition: fair  Discharged Condition: good  Indication for Admission: patient is approximately 3 weeks status post open repair of a small umbilical/incisional hernia with an intra-abdominal piece of ventral mesh 8 cm in diameter. The patient states she initially did well for about 2 weeks and has had increasing pain at the surgical site which has become intolerable. She has no fever or chills or associated GI symptoms. No redness or swelling or drainage from the wound. She was also having some intermittent syncopal episodes. She was asked to come to the emergency room for evaluation. At that time she was found to have marked tenderness around her incision although no other physical findings. CT scan showed a rim of fluid around the mesh intra-abdominally which had the appearance more of seroma then abscess. She did have moderate leukocytosis of about 14.5 thousand. The patient was admitted initially for observation.   Hospital Course: The following day her white count was up to 16,000 with continued symptoms. At that point I started her on IV Zosyn for possible infection around the mesh. Over the next several days she initially had no improvement but then began to have significant improvement in her pain. Her white count decreased back down to about 14.5 thousand but stayed at this level throughout the remainder of her hospitalization. Her incision became progressively less tender. She never developed any redness or swelling or other evidence of infection on examination of her wound. On the day of admission her pain is significantly improved and tolerable. She did  have one further episode of near syncope and fall the day prior to admission. She is on Inderal twice a day for anxiety and her blood pressure and heart rate in the hospital and borderline low. We discussed that this could be contributing to postural hypotension and she will be in contact today with her therapist to discuss reducing this medication. In the meantime we discussed precautions about getting up very slowly and standing for a moment for walking to avoid falls. We discussed her persistent leukocytosis and options of aspiration of the fluid around the mesh. However this would have the possibility of introducing infection and she is very reluctant to do this and also if she is clinically much improved we elected to hold off on this procedure for now. We will plan to continue oral antibiotics for one week and then see her back in the office and repeat her CBC. She is instructed to call as needed for fever or increased pain, wound changes or other concerns.   Treatments: antibiotics: Zosyn  Disposition: Home  Patient Instructions:    Medication List    STOP taking these medications        HYDROmorphone 8 MG tablet  Commonly known as:  DILAUDID     methylPREDNISolone 4 MG tablet  Commonly known as:  MEDROL DOSEPAK      TAKE these medications        amoxicillin-clavulanate 875-125 MG per tablet  Commonly known as:  AUGMENTIN  Take 1 tablet by mouth 2 (two) times daily.     CALCIUM PO  Take 1 tablet by mouth daily.     clonazePAM 1 MG tablet  Commonly known as:  Scarlette CalicoKLONOPIN  Take 1 mg by mouth 2 (two) times daily as needed for anxiety.     HORIZANT 300 MG Tbcr  Generic drug:  Gabapentin Enacarbil ER  Take 300 mg by mouth 2 (two) times daily.     LYSINE PO  Take 3 tablets by mouth daily.     multivitamin with minerals Tabs tablet  Take 1 tablet by mouth daily.     Oxycodone HCl 10 MG Tabs  Take 1-1.5 tablets (10-15 mg total) by mouth every 4 (four) hours as needed for  moderate pain.     propranolol 10 MG tablet  Commonly known as:  INDERAL  Take 10 mg by mouth 2 (two) times daily.     traZODone 100 MG tablet  Commonly known as:  DESYREL  Take 200 mg by mouth at bedtime.     venlafaxine XR 150 MG 24 hr capsule  Commonly known as:  EFFEXOR-XR  Take 300 mg by mouth daily with breakfast.     VITAMIN C PO  Take 1 tablet by mouth daily.        Activity: activity as tolerated and no heavy lifting for 3 weeks Diet: regular diet Wound Care: none needed  Follow-up:  With Dr. Johna Sheriff in 1 week.  Signed: Mariella Saa MD, FACS  06/01/2014, 10:05 AM

## 2014-06-01 NOTE — Discharge Instructions (Signed)
[facility] _______[facility]  UMBILICAL OR INGUINAL HERNIA REPAIR: POST OP INSTRUCTIONS  Always review your discharge instruction sheet given to you by the facility where your surgery was performed. IF YOU HAVE DISABILITY OR FAMILY LEAVE FORMS, YOU MUST BRING THEM TO THE OFFICE FOR PROCESSING.   DO NOT GIVE THEM TO YOUR DOCTOR.  1. A  prescription for pain medication may be given to you upon discharge.  Take your pain medication as prescribed, if needed.  If narcotic pain medicine is not needed, then you may take acetaminophen (Tylenol) or ibuprofen (Advil) as needed. 2. Take your usually prescribed medications unless otherwise directed. 3. If you need a refill on your pain medication, please contact your pharmacy.  They will contact our office to request authorization. Prescriptions will not be filled after 5 pm or on week-ends. 4. You should follow a light diet the first 24 hours after arrival home, such as soup and crackers, etc.  Be sure to include lots of fluids daily.  Resume your normal diet the day after surgery. 5. Most patients will experience some swelling and bruising around the umbilicus or in the groin and scrotum.  Ice packs and reclining will help.  Swelling and bruising can take several days to resolve.  6. It is common to experience some constipation if taking pain medication after surgery.  Increasing fluid intake and taking a stool softener (such as Colace) will usually help or prevent this problem from occurring.  A mild laxative (Milk of Magnesia or Miralax) should be taken according to package directions if there are no bowel movements after 48 hours. 7. Unless discharge instructions indicate otherwise, you may remove your bandages 24-48 hours after surgery, and you may shower at that time.  You may have steri-strips (small skin tapes) in place directly over the incision.  These strips should be left on the skin for 7-10 days.  If your surgeon used skin glue on the  incision, you may shower in 24 hours.  The glue will flake off over the next 2-3 weeks.  Any sutures or staples will be removed at the office during your follow-up visit. 8. ACTIVITIES:  You may resume regular (light) daily activities beginning the next day--such as daily self-care, walking, climbing stairs--gradually increasing activities as tolerated.  You may have sexual intercourse when it is comfortable.  Refrain from any heavy lifting or straining until approved by your doctor. a. You may drive when you are no longer taking prescription pain medication, you can comfortably wear a seatbelt, and you can safely maneuver your car and apply brakes. b. RETURN TO WORK:  __________________________________________________________ 9. You should see your doctor in the office for a follow-up appointment approximately 2-3 weeks after your surgery.  Make sure that you call for this appointment within a day or two after you arrive home to insure a convenient appointment time. 10. OTHER INSTRUCTIONS:  __________________________________________________________________________________________________________________________________________________________________________________________  WHEN TO CALL YOUR DOCTOR: 1. Fever over 101.0 2. Inability to urinate 3. Nausea and/or vomiting 4. Extreme swelling or bruising 5. Continued bleeding from incision. 6. Increased pain, redness, or drainage from the incision  The clinic staff is available to answer your questions during regular business hours.  Please don't hesitate to call and ask to speak to one of the nurses for clinical concerns.  If you have a medical emergency, go to the nearest emergency room or call 911.  A surgeon from [facility] is always on call at the hospital     [address], [address], [place], [place]  [address] ?  [address] [address], [place], [place]   [address] [phone] ? [phone] ? FAX [phone] Web site:  [email or web address]  

## 2014-06-01 NOTE — Progress Notes (Signed)
Patient ID: Judith Hardy, female   DOB: 08/28/1970, 44 y.o.   MRN: [id number]    Subjective: reports she is doing "okay".  States the pain at her umbilicus is clearly much better than at admission. She did get lightheaded with standing up in the bathroom yesterday and had a brief near syncopal episode and fall. States she may have twisted her back little bit which is a chronic problem for but denies any specific injuries.   Objective: Vital signs in last 24 hours: Temp:  [97.4 F (36.3 C)-98.2 F (36.8 C)] 98.1 F (36.7 C) (05/24 0525) Pulse Rate:  [57-97] 57 (05/24 0525) Resp:  [16-18] 18 (05/24 0525) BP: (99-139)/(51-82) 107/52 mmHg (05/24 0525) SpO2:  [97 %-100 %] 100 % (05/24 0525) Last BM Date: 05/31/14  Intake/Output from previous day: 05/23 0701 - 05/24 0700 In: 4085 [P.O.:2885; I.V.:1200] Out: 3900 [Urine:3900] Intake/Output this shift: Total I/O In: -  Out: 550 [Urine:550]  General appearance: alert, cooperative and no distress GI: generally soft and nontender Incision/Wound: clean and dry without swelling or erythema or drainage. Mildly tender, much improved from admission.  Lab Results:   Recent Labs  05/31/14 0508 06/01/14 0430  WBC 14.6* 14.7*  HGB 12.7 12.0  HCT 39.4 38.1  PLT 300 270   BMET No results for input(s): NA, K, CL, CO2, GLUCOSE, BUN, CREATININE, CALCIUM in the last 72 hours.   Studies/Results: No results found.  Anti-infectives: Anti-infectives    Start     Dose/Rate Route Frequency Ordered Stop   05/28/14 0800  piperacillin-tazobactam (ZOSYN) IVPB 3.375 g     3.375 g 12.5 mL/hr over 240 Minutes Intravenous 3 times per day 05/28/14 [id number]        Assessment/Plan: Pain at  umbilical hernia repair site. Question infection with elevated white count and some fluid around the mesh although by CT the fluid appeared to be mild postoperative seroma rather than abscess. There is no evidence of infection examining the wound. She does have some  persistent leukocytosis. I discussed options in detail with the patient. If she were not improving I would recommend aspiration of the fluid collection behind the mesh. However she is very reluctant to do this and also she is clinically much improved from discharge. As she is so much improved we elected to follow this closely and discharge home on 1 week of oral antibiotics with follow-up in the office in one week which time we'll  repeat her CBC and will plan to get her back to the office at any point if she feels she is worsening.   LOS: 5 days    Jesselyn Rask T 06/01/2014

## 2014-06-25 ENCOUNTER — Emergency Department (HOSPITAL_COMMUNITY): Payer: PRIVATE HEALTH INSURANCE

## 2014-06-25 ENCOUNTER — Emergency Department (HOSPITAL_COMMUNITY)
Admission: EM | Admit: 2014-06-25 | Discharge: 2014-06-25 | Disposition: A | Discharge status: Home / Self Care | Payer: PRIVATE HEALTH INSURANCE | Attending: Emergency Medicine | Admitting: Emergency Medicine

## 2014-06-25 ENCOUNTER — Encounter (HOSPITAL_COMMUNITY): Payer: Self-pay

## 2014-06-25 DIAGNOSIS — R1033 Periumbilical pain: Secondary | ICD-10-CM | POA: Insufficient documentation

## 2014-06-25 DIAGNOSIS — Z9889 Other specified postprocedural states: Secondary | ICD-10-CM

## 2014-06-25 DIAGNOSIS — Z8719 Personal history of other diseases of the digestive system: Secondary | ICD-10-CM

## 2014-06-25 DIAGNOSIS — G8929 Other chronic pain: Secondary | ICD-10-CM | POA: Diagnosis not present

## 2014-06-25 DIAGNOSIS — F329 Major depressive disorder, single episode, unspecified: Secondary | ICD-10-CM | POA: Insufficient documentation

## 2014-06-25 DIAGNOSIS — Z79899 Other long term (current) drug therapy: Secondary | ICD-10-CM | POA: Insufficient documentation

## 2014-06-25 DIAGNOSIS — R531 Weakness: Secondary | ICD-10-CM | POA: Insufficient documentation

## 2014-06-25 DIAGNOSIS — Z3202 Encounter for pregnancy test, result negative: Secondary | ICD-10-CM | POA: Insufficient documentation

## 2014-06-25 DIAGNOSIS — Z9049 Acquired absence of other specified parts of digestive tract: Secondary | ICD-10-CM | POA: Insufficient documentation

## 2014-06-25 DIAGNOSIS — Z87891 Personal history of nicotine dependence: Secondary | ICD-10-CM | POA: Diagnosis not present

## 2014-06-25 DIAGNOSIS — R55 Syncope and collapse: Secondary | ICD-10-CM | POA: Diagnosis present

## 2014-06-25 DIAGNOSIS — M1990 Unspecified osteoarthritis, unspecified site: Secondary | ICD-10-CM | POA: Diagnosis not present

## 2014-06-25 DIAGNOSIS — F419 Anxiety disorder, unspecified: Secondary | ICD-10-CM | POA: Diagnosis not present

## 2014-06-25 LAB — URINALYSIS, ROUTINE W REFLEX MICROSCOPIC
BILIRUBIN URINE: NEGATIVE
GLUCOSE, UA: NEGATIVE mg/dL
Hgb urine dipstick: NEGATIVE
KETONES UR: NEGATIVE mg/dL
Leukocytes, UA: NEGATIVE
Nitrite: NEGATIVE
PH: 7 (ref 5.0–8.0)
Protein, ur: NEGATIVE mg/dL
Specific Gravity, Urine: 1.016 (ref 1.005–1.030)
Urobilinogen, UA: 0.2 mg/dL (ref 0.0–1.0)

## 2014-06-25 LAB — CBG MONITORING, ED: Glucose-Capillary: 106 mg/dL — ABNORMAL HIGH (ref 65–99)

## 2014-06-25 LAB — LIPASE, BLOOD: Lipase: 13 U/L — ABNORMAL LOW (ref 22–51)

## 2014-06-25 LAB — COMPREHENSIVE METABOLIC PANEL
ALK PHOS: 119 U/L (ref 38–126)
ALT: 26 U/L (ref 14–54)
AST: 19 U/L (ref 15–41)
Albumin: 4 g/dL (ref 3.5–5.0)
Anion gap: 11 (ref 5–15)
BUN: 10 mg/dL (ref 6–20)
CHLORIDE: 103 mmol/L (ref 101–111)
CO2: 24 mmol/L (ref 22–32)
Calcium: 9.9 mg/dL (ref 8.9–10.3)
Creatinine, Ser: 0.74 mg/dL (ref 0.44–1.00)
GFR calc non Af Amer: >60 mL/min (ref >60)
GLUCOSE: 127 mg/dL — AB (ref 65–99)
POTASSIUM: 4 mmol/L (ref 3.5–5.1)
Sodium: 138 mmol/L (ref 135–145)
Total Bilirubin: 0.4 mg/dL (ref 0.3–1.2)
Total Protein: 8.3 g/dL — ABNORMAL HIGH (ref 6.5–8.1)

## 2014-06-25 LAB — I-STAT TROPONIN, ED: TROPONIN I, POC: 0 ng/mL (ref 0.00–0.08)

## 2014-06-25 LAB — CBC
HCT: 41.9 % (ref 36.0–46.0)
Hemoglobin: 13.7 g/dL (ref 12.0–15.0)
MCH: 32.3 pg (ref 26.0–34.0)
MCHC: 32.7 g/dL (ref 30.0–36.0)
MCV: 98.8 fL (ref 78.0–100.0)
PLATELETS: 329 10*3/uL (ref 150–400)
RBC: 4.24 MIL/uL (ref 3.87–5.11)
RDW: 13.3 % (ref 11.5–15.5)
WBC: 14.8 10*3/uL — ABNORMAL HIGH (ref 4.0–10.5)

## 2014-06-25 LAB — POC URINE PREG, ED: Preg Test, Ur: NEGATIVE

## 2014-06-25 MED ORDER — OXYCODONE-ACETAMINOPHEN 5-325 MG PO TABS: 2.0000 | ORAL_TABLET | ORAL | Status: DC | PRN

## 2014-06-25 MED ORDER — SODIUM CHLORIDE 0.9 % IV BOLUS (SEPSIS)
1000.0000 mL | Freq: Once | INTRAVENOUS | Status: AC
  Administered 2014-06-25: 1000 mL via INTRAVENOUS

## 2014-06-25 MED ORDER — MORPHINE SULFATE 4 MG/ML IJ SOLN
4.0000 mg | Freq: Once | INTRAMUSCULAR | Status: AC
  Administered 2014-06-25: 4 mg via INTRAVENOUS
  Filled 2014-06-25: qty 1

## 2014-06-25 NOTE — Progress Notes (Signed)
approximately 5-6 weeks status post open repair of a small umbilical/incisional hernia with an intra-abdominal piece of ventral mesh 8 cm in diameter- Dr B Hoxworth D/c from hospital on 06/01/14 to home

## 2014-06-25 NOTE — Discharge Instructions (Signed)
°  Abdominal Pain   Sometimes people will continue to have pain after a surgical procedure. Healing may take time and the muscles and nerves may not be aligned just as they were before your surgery. It is very important that you continue to see your surgeon in follow-up appointments to make sure that no additional procedures or tests are needed if your surgeon does not feel that her post operative healing is going as expected.    Many things can cause abdominal pain. Usually, abdominal pain is not caused by a disease and will improve without treatment. It can often be observed and treated at home. Your health care provider will do a physical exam and possibly order blood tests and X-rays to help determine the seriousness of your pain. However, in many cases, more time must pass before a clear cause of the pain can be found. Before that point, your health care provider may not know if you need more testing or further treatment. HOME CARE INSTRUCTIONS  Monitor your abdominal pain for any changes. The following actions may help to alleviate any discomfort you are experiencing:  Only take over-the-counter or prescription medicines as directed by your health care provider.  Do not take laxatives unless directed to do so by your health care provider.  Try a clear liquid diet (broth, tea, or water) as directed by your health care provider. Slowly move to a bland diet as tolerated. SEEK MEDICAL CARE IF:  You have unexplained abdominal pain.  You have abdominal pain associated with nausea or diarrhea.  You have pain when you urinate or have a bowel movement.  You experience abdominal pain that wakes you in the night.  You have abdominal pain that is worsened or improved by eating food.  You have abdominal pain that is worsened with eating fatty foods.  You have a fever. SEEK IMMEDIATE MEDICAL CARE IF:   Your pain does not go away within 2 hours.  You keep throwing up (vomiting).  Your pain  is felt only in portions of the abdomen, such as the right side or the left lower portion of the abdomen.  You pass bloody or black tarry stools. MAKE SURE YOU:  Understand these instructions.   Will watch your condition.   Will get help right away if you are not doing well or get worse.  Document Released: 10/04/2004 Document Revised: 12/30/2012 Document Reviewed: 09/03/2012 [facility] Patient Information 2015 [place], [place]. This information is not intended to replace advice given to you by your health care provider. Make sure you discuss any questions you have with your health care provider.

## 2014-06-25 NOTE — ED Provider Notes (Addendum)
CSN: [id number]     Arrival date & time 06/25/14  1134 History   First MD Initiated Contact with Patient 06/25/14 1224     Chief Complaint  Patient presents with  . Loss of Consciousness  . Weakness  . Abdominal Pain     (Consider location/radiation/quality/duration/timing/severity/associated sxs/prior Treatment) HPI Patient is status post umbilical hernia repair in May. Since that time she reports she isn't having problems with central pain and lightheadedness. She apparently gets fainting episodes when the pain increases. The patient reports that happened today, she got increased central pain and got lightheaded. The patient did have a follow-up outpatient surgery appointment today but she reports she just couldn't wait. She has not had nausea or vomiting. She is able to eat and drink but she states she has no appetite. No fever. She states she's been wearing the abdominal binder without any improvement. There is not redness or discharge from the area. Past Medical History  Diagnosis Date  . Anxiety   . Depression   . Arthritis   . Osteoarthritis   . Chronic back pain   . Sciatica    Past Surgical History  Procedure Laterality Date  . Appendectomy  2011  . Lumbar laminectomy/decompression microdiscectomy  01/10/2011    Procedure: LUMBAR LAMINECTOMY/DECOMPRESSION MICRODISCECTOMY;  Surgeon: Javier Docker;  Location: [facility];  Service: Orthopedics;  Laterality: N/A;  Decompression L4 - L5  (X-Ray)  . Lumbar laminectomy/decompression microdiscectomy  08/02/2011    Procedure: LUMBAR LAMINECTOMY/DECOMPRESSION MICRODISCECTOMY;  Surgeon: Javier Docker, MD;  Location: [facility];  Service: Orthopedics;  Laterality: N/A;  Re-do Decompression L4-L5  . Back surgery    . Hernia repair     Family History  Problem Relation Age of Onset  . Heart failure Mother    History  Substance Use Topics  . Smoking status: Former Games developer  . Smokeless tobacco: Never Used  . Alcohol Use: No   OB History     No data available     Review of Systems 10 Systems reviewed and are negative for acute change except as noted in the HPI.    Allergies  Hydrocodone  Home Medications   Prior to Admission medications   Medication Sig Start Date End Date Taking? Authorizing Provider  Ascorbic Acid (VITAMIN C PO) Take 1 tablet by mouth daily.    Yes Historical Provider, MD  CALCIUM PO Take 1 tablet by mouth daily.   Yes Historical Provider, MD  clonazePAM (KLONOPIN) 1 MG tablet Take 1 mg by mouth 2 (two) times daily as needed for anxiety.    Yes Historical Provider, MD  Gabapentin Enacarbil ER (HORIZANT) 300 MG TBCR Take 600 mg by mouth 2 (two) times daily.    Yes Historical Provider, MD  HYDROmorphone (DILAUDID) 8 MG tablet Take 8 mg by mouth every 4 (four) hours as needed for moderate pain or severe pain.  06/10/14  Yes Historical Provider, MD  LYSINE PO Take 3 tablets by mouth daily.   Yes Historical Provider, MD  Multiple Vitamin (MULTIVITAMIN WITH MINERALS) TABS tablet Take 1 tablet by mouth daily.   Yes Historical Provider, MD  propranolol (INDERAL) 10 MG tablet Take 10 mg by mouth 2 (two) times daily.   Yes Historical Provider, MD  traZODone (DESYREL) 100 MG tablet Take 200 mg by mouth at bedtime.    Yes Historical Provider, MD  venlafaxine XR (EFFEXOR-XR) 150 MG 24 hr capsule Take 300 mg by mouth daily with breakfast.   Yes Historical Provider,  MD  amoxicillin-clavulanate (AUGMENTIN) 875-125 MG per tablet Take 1 tablet by mouth 2 (two) times daily. Patient not taking: Reported on 06/25/2014 06/01/14   Glenna Fellows, MD  oxyCODONE 10 MG TABS Take 1-1.5 tablets (10-15 mg total) by mouth every 4 (four) hours as needed for moderate pain. Patient not taking: Reported on 06/25/2014 06/01/14   Glenna Fellows, MD   BP 146/86 mmHg  Pulse 76  Temp(Src) 98.5 F (36.9 C) (Oral)  Resp 16  SpO2 100% Physical Exam  Constitutional: She is oriented to person, place, and time. She appears well-developed  and well-nourished.  HENT:  Head: Normocephalic and atraumatic.  Eyes: EOM are normal. Pupils are equal, round, and reactive to light.  Neck: Neck supple.  Cardiovascular: Normal rate, regular rhythm, normal heart sounds and intact distal pulses.   Pulmonary/Chest: Effort normal and breath sounds normal.  Abdominal: Soft. Bowel sounds are normal. She exhibits no distension. There is tenderness.  The umbilical area is clean and dry. There is no erythema or drainage. Palpation does reveal an approximately 1 cm firm, marblelike nodule deep in the subcutaneous fat. Patient endorses some additional generalized pain to palpation of the central abdomen. She does not have guarding or rebound.  Musculoskeletal: Normal range of motion. She exhibits no edema.  Neurological: She is alert and oriented to person, place, and time. She has normal strength. Coordination normal. GCS eye subscore is 4. GCS verbal subscore is 5. GCS motor subscore is 6.  Skin: Skin is warm, dry and intact.  Psychiatric: She has a normal mood and affect.    ED Course  Procedures (including critical care time) Labs Review Labs Reviewed  CBC - Abnormal; Notable for the following:    WBC 14.8 (*)    All other components within normal limits  COMPREHENSIVE METABOLIC PANEL - Abnormal; Notable for the following:    Glucose, Bld 127 (*)    Total Protein 8.3 (*)    All other components within normal limits  LIPASE, BLOOD - Abnormal; Notable for the following:    Lipase 13 (*)    All other components within normal limits  CBG MONITORING, ED - Abnormal; Notable for the following:    Glucose-Capillary 106 (*)    All other components within normal limits  URINALYSIS, ROUTINE W REFLEX MICROSCOPIC (NOT AT [facility])  POC URINE PREG, ED  I-STAT TROPOININ, ED    Imaging Review No results found.   EKG Interpretation   Date/Time:  Friday June 25 2014 12:16:41 EDT Ventricular Rate:  84 PR Interval:  162 QRS Duration: 84 QT  Interval:  374 QTC Calculation: 442 R Axis:   -4 Text Interpretation:  Sinus rhythm Probable left atrial enlargement  Confirmed by Donnald Garre, MD, Lebron Conners [phone]) on 06/25/2014 12:41:19 PM     PA-C Francoise Schaumann of Gen. surgery has consult to Dr. Johna Sheriff concerning the patient's presentation and history. She reports that Dr. Johna Sheriff requested CT scan abdomen and pelvis. If the scan is the same or improved from prior CT comparison, no change to management. If the scan shows increased fluid collection or other change contact surgery for admission. MDM   Final diagnoses:  Periumbilical abdominal pain  History of umbilical hernia repair  Vasovagal syncope   The patient is well appearance. She continues to complain of severe umbilical pain since her hernia repair. I have consults the case with the surgical physician assistant who discussed it with the patient's managing surgeon. He has requested CT abdomen and pelvis to reassess the  area for comparison to prior CT. The patient is nontoxic and does not have peritoneal signs. Her fainting episodes are consistent with vasovagal episodes. These occur when her pain worsens. This is been happening since her surgery. She does not show signs of being anemic or orthostatic. He does not express chest pain or shortness of breath. Her final disposition will be pending her CT results.  General surgery has reviewed the patient's CT findings and find that there is improvement in at this time she is safe for discharge with outpatient follow-up with Dr. Johna Sheriff.  After completing the patient's evaluation and treatment and having consult with general surgery via telephone. The patient called Dr. Jamse Mead office and had me speaking with the triage nurse. She was not clear on the plan of treatment that had been arranged. I advised the nurse that whatever pain management Dr. Johna Sheriff thought was appropriate could be prescribed by him.  The patient reportedly later called the  office and states she was told that she was supposed to get her prescription here. At this point she will be provided with 6 Percocet tablets.Ongoing prescribing to be done through surgery for management of postoperative pain that is persisting.   Arby Barrette, MD 06/25/14 1456  Arby Barrette, MD 06/25/14 1616  Arby Barrette, MD 06/25/14 1710

## 2014-06-25 NOTE — ED Notes (Signed)
Bed: TU88 Expected date:  Expected time:  Means of arrival:  Comments: TR 3

## 2014-06-25 NOTE — ED Notes (Signed)
Pt c/o fainting x several episodes x 2-3 days, emesis this morning, and abdominal pain since April.  Pain score 10/10.  Pt reports having hernia surgery in April.  Pt has not taken anything for the pain.  Pt had an appointment w/ surgeon this afternoon, but sts "I couldn't wait."

## 2014-06-25 NOTE — ED Notes (Signed)
Pt presents with generalized abd pain that has been persistent since her abd surgery in May. Pt also reports feeling light headed and fainting when the pain increases. Pt last BM was this am. Pt denies nausea, vomiting, able t eat and drink, pt states "I don't have an appetite though."

## 2014-06-25 NOTE — ED Notes (Signed)
Patient transported to CT 

## 2014-07-08 ENCOUNTER — Ambulatory Visit: Payer: Medicare Other | Admitting: Physical Therapy

## 2014-10-15 ENCOUNTER — Encounter (HOSPITAL_COMMUNITY): Payer: Self-pay | Admitting: Emergency Medicine

## 2014-10-15 ENCOUNTER — Emergency Department (HOSPITAL_COMMUNITY): Payer: Medicare HMO

## 2014-10-15 ENCOUNTER — Emergency Department (HOSPITAL_COMMUNITY)
Admission: EM | Admit: 2014-10-15 | Discharge: 2014-10-15 | Disposition: A | Discharge status: Home / Self Care | Payer: Medicare HMO | Attending: Emergency Medicine | Admitting: Emergency Medicine

## 2014-10-15 DIAGNOSIS — F329 Major depressive disorder, single episode, unspecified: Secondary | ICD-10-CM | POA: Insufficient documentation

## 2014-10-15 DIAGNOSIS — Z3202 Encounter for pregnancy test, result negative: Secondary | ICD-10-CM | POA: Diagnosis not present

## 2014-10-15 DIAGNOSIS — F419 Anxiety disorder, unspecified: Secondary | ICD-10-CM | POA: Diagnosis not present

## 2014-10-15 DIAGNOSIS — R1084 Generalized abdominal pain: Secondary | ICD-10-CM | POA: Diagnosis not present

## 2014-10-15 DIAGNOSIS — D72829 Elevated white blood cell count, unspecified: Secondary | ICD-10-CM | POA: Diagnosis not present

## 2014-10-15 DIAGNOSIS — G8929 Other chronic pain: Secondary | ICD-10-CM | POA: Insufficient documentation

## 2014-10-15 DIAGNOSIS — Z9889 Other specified postprocedural states: Secondary | ICD-10-CM | POA: Insufficient documentation

## 2014-10-15 DIAGNOSIS — R109 Unspecified abdominal pain: Secondary | ICD-10-CM | POA: Diagnosis present

## 2014-10-15 DIAGNOSIS — M1990 Unspecified osteoarthritis, unspecified site: Secondary | ICD-10-CM | POA: Insufficient documentation

## 2014-10-15 DIAGNOSIS — Z79899 Other long term (current) drug therapy: Secondary | ICD-10-CM | POA: Insufficient documentation

## 2014-10-15 LAB — URINALYSIS, ROUTINE W REFLEX MICROSCOPIC
Bilirubin Urine: NEGATIVE
Glucose, UA: NEGATIVE mg/dL
HGB URINE DIPSTICK: NEGATIVE
Ketones, ur: NEGATIVE mg/dL
Leukocytes, UA: NEGATIVE
Nitrite: NEGATIVE
PROTEIN: NEGATIVE mg/dL
Specific Gravity, Urine: 1.018 (ref 1.005–1.030)
UROBILINOGEN UA: 0.2 mg/dL (ref 0.0–1.0)
pH: 6.5 (ref 5.0–8.0)

## 2014-10-15 LAB — COMPREHENSIVE METABOLIC PANEL
ALK PHOS: 92 U/L (ref 38–126)
ALT: 16 U/L (ref 14–54)
ANION GAP: 8 (ref 5–15)
AST: 13 U/L — ABNORMAL LOW (ref 15–41)
Albumin: 4.1 g/dL (ref 3.5–5.0)
BUN: 16 mg/dL (ref 6–20)
CALCIUM: 10.2 mg/dL (ref 8.9–10.3)
CO2: 26 mmol/L (ref 22–32)
CREATININE: 0.82 mg/dL (ref 0.44–1.00)
Chloride: 103 mmol/L (ref 101–111)
GFR calc Af Amer: >60 mL/min (ref >60)
Glucose, Bld: 103 mg/dL — ABNORMAL HIGH (ref 65–99)
Potassium: 3.9 mmol/L (ref 3.5–5.1)
SODIUM: 137 mmol/L (ref 135–145)
Total Bilirubin: 0.5 mg/dL (ref 0.3–1.2)
Total Protein: 8.5 g/dL — ABNORMAL HIGH (ref 6.5–8.1)

## 2014-10-15 LAB — CBC WITH DIFFERENTIAL/PLATELET
BASOS PCT: 0 %
Basophils Absolute: 0 10*3/uL (ref 0.0–0.1)
EOS ABS: 0 10*3/uL (ref 0.0–0.7)
EOS PCT: 0 %
HCT: 43.2 % (ref 36.0–46.0)
Hemoglobin: 14.4 g/dL (ref 12.0–15.0)
Lymphocytes Relative: 20 %
Lymphs Abs: 5.4 10*3/uL — ABNORMAL HIGH (ref 0.7–4.0)
MCH: 33 pg (ref 26.0–34.0)
MCHC: 33.3 g/dL (ref 30.0–36.0)
MCV: 99.1 fL (ref 78.0–100.0)
MONO ABS: 1.1 10*3/uL — AB (ref 0.1–1.0)
Monocytes Relative: 4 %
NEUTROS ABS: 20.4 10*3/uL — AB (ref 1.7–7.7)
Neutrophils Relative %: 76 %
Platelets: 374 10*3/uL (ref 150–400)
RBC: 4.36 MIL/uL (ref 3.87–5.11)
RDW: 13.6 % (ref 11.5–15.5)
WBC: 26.9 10*3/uL — ABNORMAL HIGH (ref 4.0–10.5)

## 2014-10-15 LAB — PREGNANCY, URINE: Preg Test, Ur: NEGATIVE

## 2014-10-15 LAB — LIPASE, BLOOD: LIPASE: 19 U/L — AB (ref 22–51)

## 2014-10-15 LAB — I-STAT CG4 LACTIC ACID, ED: Lactic Acid, Venous: 0.97 mmol/L (ref 0.5–2.0)

## 2014-10-15 MED ORDER — OXYCODONE-ACETAMINOPHEN 5-325 MG PO TABS: 1.0000 | ORAL_TABLET | Freq: Three times a day (TID) | ORAL | Status: DC | PRN

## 2014-10-15 MED ORDER — IOHEXOL 300 MG/ML  SOLN
100.0000 mL | Freq: Once | INTRAMUSCULAR | Status: AC | PRN
  Administered 2014-10-15: 100 mL via INTRAVENOUS

## 2014-10-15 MED ORDER — SODIUM CHLORIDE 0.9 % IV BOLUS (SEPSIS)
1000.0000 mL | Freq: Once | INTRAVENOUS | Status: AC
  Administered 2014-10-15: 1000 mL via INTRAVENOUS

## 2014-10-15 MED ORDER — IOHEXOL 300 MG/ML  SOLN
50.0000 mL | Freq: Once | INTRAMUSCULAR | Status: AC | PRN
Start: 2014-10-15 — End: 2014-10-15
  Administered 2014-10-15: 50 mL via ORAL

## 2014-10-15 MED ORDER — SODIUM CHLORIDE 0.9 % IV SOLN: Freq: Once | INTRAVENOUS | Status: DC

## 2014-10-15 MED ORDER — MORPHINE SULFATE (PF) 4 MG/ML IV SOLN
4.0000 mg | Freq: Once | INTRAVENOUS | Status: AC
  Administered 2014-10-15: 4 mg via INTRAVENOUS
  Filled 2014-10-15: qty 1

## 2014-10-15 NOTE — ED Notes (Signed)
Pt in CT.

## 2014-10-15 NOTE — ED Notes (Signed)
Bed: WA21 Expected date:  Expected time:  Means of arrival:  Comments: 

## 2014-10-15 NOTE — ED Notes (Addendum)
Pt reports generalized abd pain for the past several months that has gradually gotten worse. No n/v/d. Pt reports pain worse when passing gas or having a BM. Had a hernia repair in April. Reports she has been same for the same by several providers during this time period. Pt drinking iced coffee in the lobby

## 2014-10-15 NOTE — Discharge Instructions (Signed)
Abdominal Pain, Adult Many things can cause abdominal pain. Usually, abdominal pain is not caused by a disease and will improve without treatment. It can often be observed and treated at home. Your health care provider will do a physical exam and possibly order blood tests and X-rays to help determine the seriousness of your pain. However, in many cases, more time must pass before a clear cause of the pain can be found. Before that point, your health care provider may not know if you need more testing or further treatment. HOME CARE INSTRUCTIONS Monitor your abdominal pain for any changes. The following actions may help to alleviate any discomfort you are experiencing:  Only take over-the-counter or prescription medicines as directed by your health care provider.  Do not take laxatives unless directed to do so by your health care provider.  Try a clear liquid diet (broth, tea, or water) as directed by your health care provider. Slowly move to a bland diet as tolerated. SEEK MEDICAL CARE IF:  You have unexplained abdominal pain.  You have abdominal pain associated with nausea or diarrhea.  You have pain when you urinate or have a bowel movement.  You experience abdominal pain that wakes you in the night.  You have abdominal pain that is worsened or improved by eating food.  You have abdominal pain that is worsened with eating fatty foods.  You have a fever. SEEK IMMEDIATE MEDICAL CARE IF:  Your pain does not go away within 2 hours.  You keep throwing up (vomiting).  Your pain is felt only in portions of the abdomen, such as the right side or the left lower portion of the abdomen.  You pass bloody or black tarry stools. MAKE SURE YOU:  Understand these instructions.  Will watch your condition.  Will get help right away if you are not doing well or get worse.   This information is not intended to replace advice given to you by your health care provider. Make sure you discuss  any questions you have with your health care provider.   Document Released: 10/04/2004 Document Revised: 09/15/2014 Document Reviewed: 09/03/2012 Elsevier Interactive Patient Education 2016 [employer]  Leukocytosis  Your elevated white blood cell count is related to your steroid use.  You need to have this rechecked next week.  Leukocytosis means you have more white blood cells than normal. White blood cells are made in your bone marrow. The main job of white blood cells is to fight infection. Having too many white blood cells is a common condition. It can develop as a result of many types of medical problems. CAUSES  In some cases, your bone marrow may be normal, but it is still making too many white blood cells. This could be the result of:  Infection.  Injury.  Physical stress.  Emotional stress.  Surgery.  Allergic reactions.  Tumors that do not start in the blood or bone marrow.  An inherited disease.  Certain medicines.  Pregnancy and labor. In other cases, you may have a bone marrow disorder that is causing your body to make too many white blood cells. Bone marrow disorders include:  Leukemia. This is a type of blood cancer.  Myeloproliferative disorders. These disorders cause blood cells to grow abnormally. SYMPTOMS  Some people have no symptoms. Others have symptoms due to the medical problem that is causing their leukocytosis. These symptoms may include:  Bleeding.  Bruising.  Fever.  Night sweats.  Repeated infections.  Weakness.  Weight loss.  DIAGNOSIS  Leukocytosis is often found during blood tests that are done as part of a normal physical exam. Your caregiver will probably order other tests to help determine why you have too many white blood cells. These tests may include:  A complete blood count (CBC). This test measures all the types of blood cells in your body.  Chest X-rays, urine tests (urinalysis), or other tests to look for signs of  infection.  Bone marrow aspiration. For this test, a needle is put into your bone. Cells from the bone marrow are removed through the needle. The cells are then examined under a microscope. TREATMENT  Treatment is usually not needed for leukocytosis. However, if a disorder is causing your leukocytosis, it will need to be treated. Treatment may include:  Antibiotic medicines if you have a bacterial infection.  Bone marrow transplant. Your diseased bone marrow is replaced with healthy cells that will grow new bone marrow.  Chemotherapy. This is the use of drugs to kill cancer cells. HOME CARE INSTRUCTIONS  Only take over-the-counter or prescription medicines as directed by your caregiver.  Maintain a healthy weight. Ask your caregiver what weight is best for you.  Eat foods that are low in saturated fats and high in fiber. Eat plenty of fruits and vegetables.  Drink enough fluids to keep your urine clear or pale yellow.  Get 30 minutes of exercise at least 5 times a week. Check with your caregiver before starting a new exercise routine.  Limit caffeine and alcohol.  Do not smoke.  Keep all follow-up appointments as directed by your caregiver. SEEK MEDICAL CARE IF:  You feel weak or more tired than usual.  You develop chills, a cough, or nasal congestion.  You lose weight without trying.  You have night sweats.  You bruise easily. SEEK IMMEDIATE MEDICAL CARE IF:  You bleed more than normal.  You have chest pain.  You have trouble breathing.  You have a fever.  You have uncontrolled nausea or vomiting.  You feel dizzy or lightheaded. MAKE SURE YOU:  Understand these instructions.  Will watch your condition.  Will get help right away if you are not doing well or get worse.   This information is not intended to replace advice given to you by your health care provider. Make sure you discuss any questions you have with your health care provider.   Document  Released: 12/14/2010 Document Revised: 03/19/2011 Document Reviewed: 06/28/2014 Elsevier Interactive Patient Education [employer]

## 2014-10-15 NOTE — ED Provider Notes (Signed)
CSN: [id number]     Arrival date & time 10/15/14  4098 History   First MD Initiated Contact with Patient 10/15/14 0759     Chief Complaint  Patient presents with  . Abdominal Pain     (Consider location/radiation/quality/duration/timing/severity/associated sxs/prior Treatment) HPI Comments: 44 y.o. Female with history of umbilical hernia repair in April 2016, chronic back pain presents for abdominal pain.  The patient reports that she has had abdominal pain since having an umbilical hernia repair in April.  She says that recently the pain has been getting worse. She says that it is centered around her belly button.  She reports pain with passing gas and bowel movements but states that the pain feels around the belly button.  No rectal pain.  She denies nausea or vomiting.  No fever, chills, diarrhea or constipation.  Patient states she was seen by her OBGYN yesterday and told that her vagina, uterus, and ovaries were normal and that she should come to the ER.  Patient is a 44 y.o. female presenting with abdominal pain.  Abdominal Pain Associated symptoms: no chest pain, no chills, no constipation, no cough, no diarrhea, no dysuria, no fatigue, no fever, no hematuria, no nausea, no shortness of breath and no vomiting     Past Medical History  Diagnosis Date  . Anxiety   . Depression   . Arthritis   . Osteoarthritis   . Chronic back pain   . Sciatica    Past Surgical History  Procedure Laterality Date  . Appendectomy  2011  . Lumbar laminectomy/decompression microdiscectomy  01/10/2011    Procedure: LUMBAR LAMINECTOMY/DECOMPRESSION MICRODISCECTOMY;  Surgeon: Javier Docker;  Location: [facility];  Service: Orthopedics;  Laterality: N/A;  Decompression L4 - L5  (X-Ray)  . Lumbar laminectomy/decompression microdiscectomy  08/02/2011    Procedure: LUMBAR LAMINECTOMY/DECOMPRESSION MICRODISCECTOMY;  Surgeon: Javier Docker, MD;  Location: [facility];  Service: Orthopedics;  Laterality: N/A;  Re-do  Decompression L4-L5  . Back surgery    . Hernia repair     Family History  Problem Relation Age of Onset  . Heart failure Mother    Social History  Substance Use Topics  . Smoking status: Former Games developer  . Smokeless tobacco: Never Used  . Alcohol Use: No   OB History    No data available     Review of Systems  Constitutional: Negative for fever, chills and fatigue.  HENT: Negative for congestion, postnasal drip and rhinorrhea.   Eyes: Negative for pain and redness.  Respiratory: Negative for cough, chest tightness and shortness of breath.   Cardiovascular: Negative for chest pain and palpitations.  Gastrointestinal: Positive for abdominal pain. Negative for nausea, vomiting, diarrhea and constipation.  Genitourinary: Negative for dysuria, urgency, hematuria and flank pain.  Musculoskeletal: Negative for myalgias and back pain.  Skin: Negative for rash.  Neurological: Negative for dizziness, weakness and headaches.  Hematological: Negative for adenopathy. Does not bruise/bleed easily.      Allergies  Acetaminophen and Hydrocodone  Home Medications   Prior to Admission medications   Medication Sig Start Date End Date Taking? Authorizing Provider  clonazePAM (KLONOPIN) 1 MG tablet Take 1 mg by mouth 2 (two) times daily as needed for anxiety.    Yes Historical Provider, MD  diazepam (VALIUM) 10 MG tablet Take 10 mg by mouth daily as needed. 10/07/14  Yes Historical Provider, MD  HORIZANT 600 MG TBCR Take 600 mg by mouth 2 (two) times daily. 10/12/14  Yes Historical Provider, MD  Oxycodone HCl 10 MG TABS Take 10 mg by mouth 4 (four) times daily as needed (For pain.).  10/06/14  Yes Historical Provider, MD  traMADol (ULTRAM) 50 MG tablet Take 100 mg by mouth 2 (two) times daily. 10/13/14  Yes Historical Provider, MD  amoxicillin-clavulanate (AUGMENTIN) 875-125 MG per tablet Take 1 tablet by mouth 2 (two) times daily. Patient not taking: Reported on 06/25/2014 06/01/14   Glenna Fellows, MD  Ascorbic Acid (VITAMIN C PO) Take 1 tablet by mouth daily.     Historical Provider, MD  CALCIUM PO Take 1 tablet by mouth daily.    Historical Provider, MD  dexamethasone (DECADRON) 2 MG tablet Take 2 mg by mouth every evening. 10/06/14   Historical Provider, MD  Gabapentin Enacarbil ER (HORIZANT) 300 MG TBCR Take 600 mg by mouth 2 (two) times daily.     Historical Provider, MD  HYDROmorphone (DILAUDID) 8 MG tablet Take 8 mg by mouth every 4 (four) hours as needed for moderate pain or severe pain.  06/10/14   Historical Provider, MD  LYSINE PO Take 3 tablets by mouth daily.    Historical Provider, MD  Multiple Vitamin (MULTIVITAMIN WITH MINERALS) TABS tablet Take 1 tablet by mouth daily.    Historical Provider, MD  oxyCODONE 10 MG TABS Take 1-1.5 tablets (10-15 mg total) by mouth every 4 (four) hours as needed for moderate pain. Patient not taking: Reported on 06/25/2014 06/01/14   Glenna Fellows, MD  oxyCODONE-acetaminophen (PERCOCET/ROXICET) 5-325 MG per tablet Take 2 tablets by mouth every 4 (four) hours as needed for severe pain. 06/25/14   Arby Barrette, MD  propranolol (INDERAL) 10 MG tablet Take 10 mg by mouth 2 (two) times daily.    Historical Provider, MD  traZODone (DESYREL) 100 MG tablet Take 200 mg by mouth at bedtime.     Historical Provider, MD  venlafaxine XR (EFFEXOR-XR) 150 MG 24 hr capsule Take 300 mg by mouth daily with breakfast.    Historical Provider, MD   BP 95/50 mmHg  Pulse 67  Temp(Src) 97.6 F (36.4 C) (Oral)  Resp 16  SpO2 94% Physical Exam  Constitutional: She is oriented to person, place, and time. She appears well-developed and well-nourished. No distress.  HENT:  Head: Normocephalic and atraumatic.  Right Ear: External ear normal.  Left Ear: External ear normal.  Nose: Nose normal.  Mouth/Throat: Oropharynx is clear and moist. No oropharyngeal exudate.  Eyes: EOM are normal. Pupils are equal, round, and reactive to light.  Neck: Normal range  of motion. Neck supple.  Cardiovascular: Normal rate, regular rhythm, normal heart sounds and intact distal pulses.   No murmur heard. Pulmonary/Chest: Effort normal. No respiratory distress. She has no wheezes. She has no rales.  Abdominal: Soft. She exhibits no distension. There is tenderness (mild diffuse, most significant over the umbilicus).  Musculoskeletal: Normal range of motion. She exhibits no edema or tenderness.  Neurological: She is alert and oriented to person, place, and time.  Skin: Skin is warm and dry. No rash noted. She is not diaphoretic.  Vitals reviewed.   ED Course  Procedures (including critical care time) Labs Review Labs Reviewed  CBC WITH DIFFERENTIAL/PLATELET  COMPREHENSIVE METABOLIC PANEL  LIPASE, BLOOD  URINALYSIS, ROUTINE W REFLEX MICROSCOPIC (NOT AT [facility])  I-STAT CG4 LACTIC ACID, ED  I-STAT BETA HCG BLOOD, ED ([facility], [facility], [facility] ONLY)    Imaging Review No results found. I have personally reviewed and evaluated these images and lab results as part of my  medical decision-making.   EKG Interpretation None      MDM  Patient was seen and evaluated in stable condition.  Acute on chronic abdominal pain.  Vitals stable.  CT negative for acute process.  Labs unremarkable other than leukocytosis but patient currently on extended steroid taper.  Patient without fever or sign of acute infection, no diarrhea, normal lactic acid.  Discussed all results with patient who tolerated PO.  She was discharged home in stable condition with instruction to follow up with her primary care physician and with her surgeon.  All questions were answered prior to discharge. Final diagnoses:  None    1. Acute on chronic abdominal pain    Leta Baptist, MD 10/15/14 2156

## 2014-10-15 NOTE — ED Notes (Signed)
Pt requesting pain medication. Dr. Cyndie Chime made aware of same.

## 2014-10-15 NOTE — ED Notes (Signed)
Denies N/V or SOB

## 2014-11-30 ENCOUNTER — Encounter: Payer: Self-pay | Admitting: Family Medicine

## 2014-12-09 DIAGNOSIS — J189 Pneumonia, unspecified organism: Secondary | ICD-10-CM

## 2014-12-09 HISTORY — DX: Pneumonia, unspecified organism: J18.9

## 2014-12-24 ENCOUNTER — Emergency Department (HOSPITAL_COMMUNITY): Payer: Medicare HMO

## 2014-12-24 ENCOUNTER — Observation Stay (HOSPITAL_COMMUNITY)
Admission: EM | Admit: 2014-12-24 | Discharge: 2014-12-26 | Disposition: A | Discharge status: Home / Self Care | Payer: Medicare HMO | Attending: Internal Medicine | Admitting: Internal Medicine

## 2014-12-24 ENCOUNTER — Encounter (HOSPITAL_COMMUNITY): Payer: Self-pay

## 2014-12-24 DIAGNOSIS — G8929 Other chronic pain: Secondary | ICD-10-CM | POA: Diagnosis not present

## 2014-12-24 DIAGNOSIS — Z87891 Personal history of nicotine dependence: Secondary | ICD-10-CM | POA: Diagnosis not present

## 2014-12-24 DIAGNOSIS — R05 Cough: Secondary | ICD-10-CM

## 2014-12-24 DIAGNOSIS — J189 Pneumonia, unspecified organism: Secondary | ICD-10-CM | POA: Diagnosis not present

## 2014-12-24 DIAGNOSIS — M1990 Unspecified osteoarthritis, unspecified site: Secondary | ICD-10-CM | POA: Diagnosis not present

## 2014-12-24 DIAGNOSIS — Z79899 Other long term (current) drug therapy: Secondary | ICD-10-CM | POA: Diagnosis not present

## 2014-12-24 DIAGNOSIS — J159 Unspecified bacterial pneumonia: Secondary | ICD-10-CM | POA: Diagnosis not present

## 2014-12-24 DIAGNOSIS — R112 Nausea with vomiting, unspecified: Secondary | ICD-10-CM | POA: Diagnosis present

## 2014-12-24 DIAGNOSIS — F419 Anxiety disorder, unspecified: Secondary | ICD-10-CM | POA: Insufficient documentation

## 2014-12-24 DIAGNOSIS — M5126 Other intervertebral disc displacement, lumbar region: Secondary | ICD-10-CM | POA: Diagnosis present

## 2014-12-24 DIAGNOSIS — R059 Cough, unspecified: Secondary | ICD-10-CM

## 2014-12-24 DIAGNOSIS — F329 Major depressive disorder, single episode, unspecified: Secondary | ICD-10-CM | POA: Diagnosis not present

## 2014-12-24 LAB — URINALYSIS, ROUTINE W REFLEX MICROSCOPIC
Bilirubin Urine: NEGATIVE
GLUCOSE, UA: NEGATIVE mg/dL
HGB URINE DIPSTICK: NEGATIVE
KETONES UR: NEGATIVE mg/dL
Nitrite: NEGATIVE
PH: 6 (ref 5.0–8.0)
PROTEIN: NEGATIVE mg/dL
Specific Gravity, Urine: 1.017 (ref 1.005–1.030)

## 2014-12-24 LAB — CBC WITH DIFFERENTIAL/PLATELET
BASOS PCT: 0 %
Basophils Absolute: 0 10*3/uL (ref 0.0–0.1)
EOS PCT: 1 %
Eosinophils Absolute: 0.1 10*3/uL (ref 0.0–0.7)
HCT: 38.1 % (ref 36.0–46.0)
HEMOGLOBIN: 12.6 g/dL (ref 12.0–15.0)
LYMPHS PCT: 30 %
Lymphs Abs: 3.7 10*3/uL (ref 0.7–4.0)
MCH: 32.6 pg (ref 26.0–34.0)
MCHC: 33.1 g/dL (ref 30.0–36.0)
MCV: 98.4 fL (ref 78.0–100.0)
MONOS PCT: 4 %
Monocytes Absolute: 0.5 10*3/uL (ref 0.1–1.0)
NEUTROS ABS: 7.9 10*3/uL — AB (ref 1.7–7.7)
Neutrophils Relative %: 65 %
Platelets: 504 10*3/uL — ABNORMAL HIGH (ref 150–400)
RBC: 3.87 MIL/uL (ref 3.87–5.11)
RDW: 13.1 % (ref 11.5–15.5)
WBC: 12.2 10*3/uL — ABNORMAL HIGH (ref 4.0–10.5)

## 2014-12-24 LAB — COMPREHENSIVE METABOLIC PANEL
ALBUMIN: 3.7 g/dL (ref 3.5–5.0)
ALK PHOS: 89 U/L (ref 38–126)
ALT: 29 U/L (ref 14–54)
AST: 23 U/L (ref 15–41)
Anion gap: 10 (ref 5–15)
BUN: 10 mg/dL (ref 6–20)
CO2: 26 mmol/L (ref 22–32)
Calcium: 9.1 mg/dL (ref 8.9–10.3)
Chloride: 104 mmol/L (ref 101–111)
Creatinine, Ser: 0.93 mg/dL (ref 0.44–1.00)
GFR calc Af Amer: >60 mL/min (ref >60)
GFR calc non Af Amer: >60 mL/min (ref >60)
GLUCOSE: 116 mg/dL — AB (ref 65–99)
Potassium: 3.8 mmol/L (ref 3.5–5.1)
Sodium: 140 mmol/L (ref 135–145)
Total Bilirubin: 0.5 mg/dL (ref 0.3–1.2)
Total Protein: 7.5 g/dL (ref 6.5–8.1)

## 2014-12-24 LAB — LIPASE, BLOOD: Lipase: 24 U/L (ref 11–51)

## 2014-12-24 LAB — URINE MICROSCOPIC-ADD ON

## 2014-12-24 LAB — HCG, QUANTITATIVE, PREGNANCY: hCG, Beta Chain, Quant, S: 1 m[IU]/mL (ref <5)

## 2014-12-24 LAB — PREGNANCY, URINE: Preg Test, Ur: NEGATIVE

## 2014-12-24 MED ORDER — ONDANSETRON HCL 4 MG/2ML IJ SOLN
4.0000 mg | Freq: Once | INTRAMUSCULAR | Status: AC
  Administered 2014-12-24: 4 mg via INTRAVENOUS
  Filled 2014-12-24: qty 2

## 2014-12-24 MED ORDER — DEXTROSE 5 % IV SOLN: 1.0000 g | INTRAVENOUS | Status: DC

## 2014-12-24 MED ORDER — ALBUTEROL SULFATE (2.5 MG/3ML) 0.083% IN NEBU
2.5000 mg | INHALATION_SOLUTION | RESPIRATORY_TRACT | Status: DC | PRN
  Administered 2014-12-25: 2.5 mg via RESPIRATORY_TRACT
  Filled 2014-12-24: qty 3

## 2014-12-24 MED ORDER — SODIUM CHLORIDE 0.9 % IV SOLN: INTRAVENOUS | Status: DC

## 2014-12-24 MED ORDER — DEXTROSE 5 % IV SOLN
500.0000 mg | INTRAVENOUS | Status: DC
  Administered 2014-12-24 – 2014-12-25 (×2): 500 mg via INTRAVENOUS
  Filled 2014-12-24 (×2): qty 500

## 2014-12-24 MED ORDER — DIAZEPAM 5 MG PO TABS
10.0000 mg | ORAL_TABLET | Freq: Every evening | ORAL | Status: DC | PRN
  Administered 2014-12-24 – 2014-12-25 (×2): 10 mg via ORAL
  Filled 2014-12-24 (×2): qty 2

## 2014-12-24 MED ORDER — VENLAFAXINE HCL ER 150 MG PO CP24
300.0000 mg | ORAL_CAPSULE | Freq: Every day | ORAL | Status: DC
  Administered 2014-12-25 – 2014-12-26 (×2): 300 mg via ORAL
  Filled 2014-12-24 (×2): qty 2

## 2014-12-24 MED ORDER — HYDROMORPHONE HCL 1 MG/ML IJ SOLN
1.0000 mg | Freq: Once | INTRAMUSCULAR | Status: AC
  Administered 2014-12-24: 1 mg via INTRAVENOUS
  Filled 2014-12-24: qty 1

## 2014-12-24 MED ORDER — SODIUM CHLORIDE 0.9 % IV BOLUS (SEPSIS)
2000.0000 mL | Freq: Once | INTRAVENOUS | Status: AC
  Administered 2014-12-24: 2000 mL via INTRAVENOUS

## 2014-12-24 MED ORDER — ENOXAPARIN SODIUM 40 MG/0.4ML ~~LOC~~ SOLN
40.0000 mg | SUBCUTANEOUS | Status: DC
  Administered 2014-12-24: 40 mg via SUBCUTANEOUS

## 2014-12-24 MED ORDER — SODIUM CHLORIDE 0.9 % IV BOLUS (SEPSIS)
1000.0000 mL | Freq: Once | INTRAVENOUS | Status: AC
  Administered 2014-12-24: 1000 mL via INTRAVENOUS

## 2014-12-24 MED ORDER — SODIUM CHLORIDE 0.9 % IV SOLN
3.0000 g | Freq: Four times a day (QID) | INTRAVENOUS | Status: DC
  Administered 2014-12-24 – 2014-12-25 (×4): 3 g via INTRAVENOUS
  Filled 2014-12-24 (×5): qty 3

## 2014-12-24 MED ORDER — SODIUM CHLORIDE 0.9 % IV SOLN
INTRAVENOUS | Status: DC
  Administered 2014-12-24: 09:00:00 via INTRAVENOUS

## 2014-12-24 MED ORDER — LORAZEPAM 2 MG/ML IJ SOLN
1.0000 mg | Freq: Once | INTRAMUSCULAR | Status: AC
  Administered 2014-12-24: 1 mg via INTRAVENOUS
  Filled 2014-12-24: qty 1

## 2014-12-24 MED ORDER — ONDANSETRON HCL 4 MG/2ML IJ SOLN: 4.0000 mg | Freq: Four times a day (QID) | INTRAMUSCULAR | Status: DC | PRN

## 2014-12-24 MED ORDER — GABAPENTIN 300 MG PO CAPS
900.0000 mg | ORAL_CAPSULE | Freq: Every day | ORAL | Status: DC
  Administered 2014-12-24 – 2014-12-25 (×2): 900 mg via ORAL
  Filled 2014-12-24 (×2): qty 3

## 2014-12-24 MED ORDER — NALOXONE HCL 0.4 MG/ML IJ SOLN: 0.4000 mg | INTRAMUSCULAR | Status: DC | PRN

## 2014-12-24 MED ORDER — PROPRANOLOL HCL 10 MG PO TABS
10.0000 mg | ORAL_TABLET | Freq: Two times a day (BID) | ORAL | Status: DC
  Administered 2014-12-24: 10 mg via ORAL
  Filled 2014-12-24 (×3): qty 1

## 2014-12-24 MED ORDER — HYDROMORPHONE HCL 4 MG PO TABS
4.0000 mg | ORAL_TABLET | Freq: Two times a day (BID) | ORAL | Status: DC | PRN
  Administered 2014-12-24 – 2014-12-26 (×5): 4 mg via ORAL
  Filled 2014-12-24 (×5): qty 1

## 2014-12-24 MED ORDER — TRAZODONE HCL 50 MG PO TABS
250.0000 mg | ORAL_TABLET | Freq: Every evening | ORAL | Status: DC | PRN
  Administered 2014-12-24 – 2014-12-25 (×2): 250 mg via ORAL
  Filled 2014-12-24 (×2): qty 5

## 2014-12-24 NOTE — ED Provider Notes (Signed)
CSN: [id number]     Arrival date & time 12/24/14  [id number] History   First MD Initiated Contact with Patient 12/24/14 [phone]     Chief Complaint  Patient presents with  . Back Pain  . Cough     (Consider location/radiation/quality/duration/timing/severity/associated sxs/prior Treatment) HPI Comments: Patient here complaining of worsening chronic lower back pain secondary to cough she's had for 2 weeks. Last week she described the cough is productive of black sputum and she took amoxicillin and it did resolve. She had a fever last week but none this week. For the past 2 weeks she has had trouble keeping down food. When she eats she has dry heaves as well as watery diarrhea. Denies any abdominal discomfort with this. Her back pain is sharp and worse with movement and without neurological findings. Denies any urinary symptoms  Patient is a 44 y.o. female presenting with back pain and cough. The history is provided by the patient.  Back Pain Cough   Past Medical History  Diagnosis Date  . Anxiety   . Depression   . Arthritis   . Osteoarthritis   . Chronic back pain   . Sciatica    Past Surgical History  Procedure Laterality Date  . Appendectomy  2011  . Lumbar laminectomy/decompression microdiscectomy  01/10/2011    Procedure: LUMBAR LAMINECTOMY/DECOMPRESSION MICRODISCECTOMY;  Surgeon: [name];  Location: [facility];  Service: Orthopedics;  Laterality: N/A;  Decompression L4 - L5  (X-Ray)  . Lumbar laminectomy/decompression microdiscectomy  08/02/2011    Procedure: LUMBAR LAMINECTOMY/DECOMPRESSION MICRODISCECTOMY;  Surgeon: [name], MD;  Location: [facility];  Service: Orthopedics;  Laterality: N/A;  Re-do Decompression L4-L5  . Back surgery    . Hernia repair     Family History  Problem Relation Age of Onset  . Heart failure Mother    Social History  Substance Use Topics  . Smoking status: Former Games developermoker  . Smokeless tobacco: Never Used  . Alcohol Use: No   OB History    No data available     Review of Systems  Respiratory: Positive for cough.   Musculoskeletal: Positive for back pain.  All other systems reviewed and are negative.     Allergies  Acetaminophen and Hydrocodone  Home Medications   Prior to Admission medications   Medication Sig Start Date End Date Taking? Authorizing Provider  amoxicillin-clavulanate (AUGMENTIN) 875-125 MG per tablet Take 1 tablet by mouth 2 (two) times daily. Patient not taking: Reported on 06/25/2014 06/01/14   Glenna FellowsBenjamin Hoxworth, MD  clonazePAM (KLONOPIN) 1 MG tablet Take 1 mg by mouth 2 (two) times daily as needed for anxiety.     Historical Provider, MD  dexamethasone (DECADRON) 2 MG tablet Take 2 mg by mouth every evening. 10/06/14   Historical Provider, MD  diazepam (VALIUM) 10 MG tablet Take 10 mg by mouth at bedtime as needed (For anxiety or panic attacks.).  10/07/14   Historical Provider, MD  HORIZANT 600 MG TBCR Take 600 mg by mouth 2 (two) times daily. 10/12/14   Historical Provider, MD  Oxycodone HCl 10 MG TABS Take 10 mg by mouth 4 (four) times daily as needed (For pain.).  10/06/14   Historical Provider, MD  oxyCODONE-acetaminophen (PERCOCET/ROXICET) 5-325 MG tablet Take 1 tablet by mouth every 8 (eight) hours as needed for moderate pain or severe pain. 10/15/14   Leta BaptistEmily Roe Nguyen, MD  propranolol (INDERAL) 10 MG tablet Take 10 mg by mouth 2 (two) times daily.    Historical  Provider, MD  traMADol (ULTRAM) 50 MG tablet Take 100 mg by mouth 2 (two) times daily as needed (For pain.).  10/13/14   Historical Provider, MD  traZODone (DESYREL) 100 MG tablet Take 250-300 mg by mouth at bedtime as needed for sleep.     Historical Provider, MD  venlafaxine XR (EFFEXOR-XR) 150 MG 24 hr capsule Take 300 mg by mouth daily. 09/21/14   Historical Provider, MD   BP 137/76 mmHg  Pulse 92  Temp(Src) 97.8 F (36.6 C) (Oral)  Resp 20  SpO2 96% Physical Exam  Constitutional: She is oriented to person, place, and time. She appears  well-developed and well-nourished.  Non-toxic appearance. No distress.  HENT:  Head: Normocephalic and atraumatic.  Eyes: Conjunctivae, EOM and lids are normal. Pupils are equal, round, and reactive to light.  Neck: Normal range of motion. Neck supple. No tracheal deviation present. No thyroid mass present.  Cardiovascular: Normal rate, regular rhythm and normal heart sounds.  Exam reveals no gallop.   No murmur heard. Pulmonary/Chest: Effort normal and breath sounds normal. No stridor. No respiratory distress. She has no decreased breath sounds. She has no wheezes. She has no rhonchi. She has no rales.  Abdominal: Soft. Normal appearance and bowel sounds are normal. She exhibits no distension. There is no tenderness. There is no rebound and no CVA tenderness.  Musculoskeletal: Normal range of motion. She exhibits no edema or tenderness.       Back:  Neurological: She is alert and oriented to person, place, and time. She has normal strength. No cranial nerve deficit or sensory deficit. GCS eye subscore is 4. GCS verbal subscore is 5. GCS motor subscore is 6.  Skin: Skin is warm and dry. No abrasion and no rash noted.  Psychiatric: She has a normal mood and affect. Her speech is normal and behavior is normal.  Nursing note and vitals reviewed.   ED Course  Procedures (including critical care time) Labs Review Labs Reviewed  CBC WITH DIFFERENTIAL/PLATELET  COMPREHENSIVE METABOLIC PANEL  LIPASE, BLOOD    Imaging Review No results found. I have personally reviewed and evaluated these images and lab results as part of my medical decision-making.   EKG Interpretation None      MDM   Final diagnoses:  Cough    Patient given IV fluids and pain meds here. Chest x-ray shows right-sided pneumonia patient started on Zithromax and Rocephin and will be admitted for treatment of 3 acquired pneumonia    Lorre Nick, MD 12/24/14 1103

## 2014-12-24 NOTE — H&P (Addendum)
Patient Demographics:    Judith Hardy, is a 44 y.o. female  MRN: [id number]   DOB - May 14, 1970  Admit Date - 12/24/2014  Outpatient Primary MD for the patient is PROVIDER NOT IN SYSTEM   With History of -  Past Medical History  Diagnosis Date  . Anxiety   . Depression   . Arthritis   . Osteoarthritis   . Chronic back pain   . Sciatica       Past Surgical History  Procedure Laterality Date  . Appendectomy  2011  . Lumbar laminectomy/decompression microdiscectomy  01/10/2011    Procedure: LUMBAR LAMINECTOMY/DECOMPRESSION MICRODISCECTOMY;  Surgeon: Javier Docker;  Location: [facility];  Service: Orthopedics;  Laterality: N/A;  Decompression L4 - L5  (X-Ray)  . Lumbar laminectomy/decompression microdiscectomy  08/02/2011    Procedure: LUMBAR LAMINECTOMY/DECOMPRESSION MICRODISCECTOMY;  Surgeon: Javier Docker, MD;  Location: [facility];  Service: Orthopedics;  Laterality: N/A;  Re-do Decompression L4-L5  . Back surgery    . Hernia repair      in for   Chief Complaint  Patient presents with  . Back Pain  . Cough      HPI:    Judith Hardy  is a 44 y.o. female, chronic L-spine pain and left lower extremity weakness needing a cane to walk, has been followed at [facility] [place] with different pain clinics for back pain, perineum vehicle hernia repair surgery by Dr. Johna Sheriff few months ago with healed and normal-looking postop site but patient has pain around that site on a chronic basis, anxiety and depression. Patient with above history comes to the ER with 7 to ten-day history of mildly productive cough with intermittent nausea and vomiting, she has also developed mild diarrhea, she came to the hospital for ongoing cough.  The ER workup suggestive of right upper lobe infiltrate due to community-acquired  pneumonia, her lab work suggested mild leukocytosis, her vital signs were stable without any signs of hypoxia. I was called to keep the patient in hospital for a 23 observation.    Review of systems:    In addition to the HPI above,   No Fever-chills, No Headache, No changes with Vision or hearing, No problems swallowing food or Liquids, No Chest pain, positive ongoing Cough but without Shortness of Breath, Chronic periumbilical incisional Abdominal pain, No Nausea or Vommitting, Bowel movements are regular, No Blood in stool or Urine, No dysuria, No new skin rashes or bruises, No new joints pains-aches,  No new weakness, tingling, numbness in any extremity, No recent weight gain or loss, No polyuria, polydypsia or polyphagia, No significant Mental Stressors.  A full 10 point Review of Systems was done, except as stated above, all other Review of Systems were negative.    Social History:     Social History  Substance Use Topics  . Smoking status: Former Games developer  . Smokeless tobacco: Never Used  . Alcohol Use:  No    Lives - at home walks with a cane chronic left lower extremity weakness      Family History :     Family History  Problem Relation Age of Onset  . Heart failure Mother        Home Medications:   Prior to Admission medications   Medication Sig Start Date End Date Taking? Authorizing Provider  diazepam (VALIUM) 10 MG tablet Take 10 mg by mouth at bedtime as needed (For anxiety or panic attacks.).  10/07/14  Yes Historical Provider, MD  EVZIO 0.4 MG/0.4ML SOAJ 0.4 mg once as needed (emergency opiod overdose).  11/26/14  Yes Historical Provider, MD  gabapentin (NEURONTIN) 300 MG capsule Take 900 mg by mouth at bedtime.   Yes Historical Provider, MD  HYDROmorphone (DILAUDID) 4 MG tablet Take 4 mg by mouth 2 (two) times daily as needed for severe pain.   Yes Historical Provider, MD  ibuprofen (ADVIL,MOTRIN) 200 MG tablet Take 400 mg by mouth every 6 (six)  hours as needed for moderate pain.   Yes Historical Provider, MD  NON FORMULARY Take 4 capsules by mouth daily. super lysine   Yes Historical Provider, MD  propranolol (INDERAL) 10 MG tablet Take 10 mg by mouth 2 (two) times daily.   Yes Historical Provider, MD  tiZANidine (ZANAFLEX) 4 MG tablet Take 2-4 mg by mouth 2 (two) times daily as needed for muscle spasms. 4 mg at bedtime and 2mg  in the morning if needed 11/03/14  Yes Historical Provider, MD  traZODone (DESYREL) 100 MG tablet Take 250 mg by mouth at bedtime as needed for sleep.    Yes Historical Provider, MD  venlafaxine XR (EFFEXOR-XR) 150 MG 24 hr capsule Take 300 mg by mouth daily. 09/21/14  Yes Historical Provider, MD  amoxicillin-clavulanate (AUGMENTIN) 875-125 MG per tablet Take 1 tablet by mouth 2 (two) times daily. Patient not taking: Reported on 06/25/2014 06/01/14   Glenna Fellows, MD  oxyCODONE-acetaminophen (PERCOCET/ROXICET) 5-325 MG tablet Take 1 tablet by mouth every 8 (eight) hours as needed for moderate pain or severe pain. Patient not taking: Reported on 12/24/2014 10/15/14   Leta Baptist, MD     Allergies:     Allergies  Allergen Reactions  . Acetaminophen Other (See Comments)    Headache  . Hydrocodone Itching     Physical Exam:   Vitals  Blood pressure 114/69, pulse 78, temperature 97.8 F (36.6 C), temperature source Oral, resp. rate 18, SpO2 95 %.   1. General obese middle-aged African-American female lying in bed in NAD,     2. Normal affect and insight, Not Suicidal or Homicidal, Awake Alert, Oriented X 3.  3. No F.N deficits, ALL C.Nerves Intact, Strength 5/5 all 3extremities, Sensation intact all 4 extremities, Plantars down going bilaterally . Chronic left lower extremity weakness uses a cane to walk   4. Ears and Eyes appear Normal, Conjunctivae clear, PERRLA. Moist Oral Mucosa.  5. Supple Neck, No JVD, No cervical lymphadenopathy appriciated, No Carotid Bruits.  6. Symmetrical Chest  wall movement, Good air movement bilaterally, right upper lobe rales  7. RRR, No Gallops, Rubs or Murmurs, No Parasternal Heave.  8. Positive Bowel Sounds, Abdomen Soft, No tenderness, No organomegaly appriciated,No rebound -guarding or rigidity.  9.  No Cyanosis, Normal Skin Turgor, No Skin Rash or Bruise.  10. Good muscle tone,  joints appear normal , no effusions, Normal ROM.  11. No Palpable Lymph Nodes in Neck or Axillae  Data Review:    CBC  Recent Labs Lab 12/24/14 0858  WBC 12.2*  HGB 12.6  HCT 38.1  PLT 504*  MCV 98.4  MCH 32.6  MCHC 33.1  RDW 13.1  LYMPHSABS 3.7  MONOABS 0.5  EOSABS 0.1  BASOSABS 0.0   ------------------------------------------------------------------------------------------------------------------  Chemistries   Recent Labs Lab 12/24/14 0858  NA 140  K 3.8  CL 104  CO2 26  GLUCOSE 116*  BUN 10  CREATININE 0.93  CALCIUM 9.1  AST 23  ALT 29  ALKPHOS 89  BILITOT 0.5   ------------------------------------------------------------------------------------------------------------------ CrCl cannot be calculated (Unknown ideal weight.). ------------------------------------------------------------------------------------------------------------------ No results for input(s): TSH, T4TOTAL, T3FREE, THYROIDAB in the last 72 hours.  Invalid input(s): FREET3   Coagulation profile No results for input(s): INR, PROTIME in the last 168 hours. ------------------------------------------------------------------------------------------------------------------- No results for input(s): DDIMER in the last 72 hours. -------------------------------------------------------------------------------------------------------------------  Cardiac Enzymes No results for input(s): CKMB, TROPONINI, MYOGLOBIN in the last 168 hours.  Invalid input(s):  CK ------------------------------------------------------------------------------------------------------------------ Invalid input(s): POCBNP   ---------------------------------------------------------------------------------------------------------------  Urinalysis    Component Value Date/Time   COLORURINE YELLOW 10/15/2014 0901   APPEARANCEUR CLOUDY* 10/15/2014 0901   LABSPEC 1.018 10/15/2014 0901   PHURINE 6.5 10/15/2014 0901   GLUCOSEU NEGATIVE 10/15/2014 0901   HGBUR NEGATIVE 10/15/2014 0901   BILIRUBINUR NEGATIVE 10/15/2014 0901   KETONESUR NEGATIVE 10/15/2014 0901   PROTEINUR NEGATIVE 10/15/2014 0901   UROBILINOGEN 0.2 10/15/2014 0901   NITRITE NEGATIVE 10/15/2014 0901   LEUKOCYTESUR NEGATIVE 10/15/2014 0901    ----------------------------------------------------------------------------------------------------------------   Imaging Results:    Dg Chest 2 View  12/24/2014  CLINICAL DATA:  Productive dry cough EXAM: CHEST  2 VIEW COMPARISON:  06/25/2014 FINDINGS: There is right upper lobe airspace disease. There no other focal parenchymal opacity. There is no pleural effusion or pneumothorax. The heart and mediastinal contours are unremarkable. The osseous structures are unremarkable. IMPRESSION: Right upper lobe pneumonia. Electronically Signed   By: Elige Ko   On: 12/24/2014 09:35        Assessment & Plan:      1.  Community-acquired pneumonia with some nausea vomiting ongoing for 7-10 days - patient appears nontoxic, lab work and vital signs are benign, will be kept for 23 are observation, sputum and blood cultures, Unasyn with Rocephin to be dosed by pharmacy, supportive care with albuterol nebulizer treatments and if required oxygen. Likely discharge in 24-48 hours. Patient clearly informed.    2. Chronic lumbar spine pain with chronic left lower extremity weakness requiring a cane to walk. Patient has had multiple visits to all the surrounding hospitals  which include [facility], [facility], [facility] and [facility] health care. She has also been to pain clinics. She has been clearly told that for her chronic back pain her home medications will be continued and she will not be given any IV narcotics or narcotic prescription upon discharge. Continue supportive care. For her chronic lower extremity weakness PT. offer an outpatient takes considerable amount of narcotics and sedatives at home. We will order as needed Narcan.    3. Anxiety and depression continue home medications.    4. Essential hypertension continue home dose beta blocker.    5. Ongoing diarrhea. Rule out C. difficile although clinically less likely.    Follow urine and serum pregnancy test. Follow UA.     DVT Prophylaxis   Lovenox   AM Labs Ordered, also please review Full Orders  Family Communication: Admission, patients condition and plan of care including tests being ordered  have been discussed with the patient who indicates understanding and agree with the plan and Code Status.  Code Status Full  Likely DC to  Home in a day  Condition Fair  Time spent in minutes :35    SINGH,PRASHANT K M.D on 12/24/2014 at 11:21 AM  Between 7am to 7pm - Pager - [phone]  After 7pm go to [email or web address] - password [email or web address]  [facility] - Office  [phone]

## 2014-12-24 NOTE — Progress Notes (Signed)
ANTIBIOTIC CONSULT NOTE - INITIAL  Pharmacy Consult for Unasyn Indication: CAP  Allergies  Allergen Reactions  . Acetaminophen Other (See Comments)    Headache  . Hydrocodone Itching    Patient Measurements:   Weight from 05/28/14: 99.3 kg  Vital Signs: Temp: 97.8 F (36.6 C) (12/16 0806) Temp Source: Oral (12/16 0806) BP: 114/69 mmHg (12/16 1000) Pulse Rate: 78 (12/16 1000) Intake/Output from previous day:   Intake/Output from this shift:    Labs:  Recent Labs  12/24/14 0858  WBC 12.2*  HGB 12.6  PLT 504*  CREATININE 0.93   CrCl cannot be calculated (Unknown ideal weight.).    Microbiology: No results found for this or any previous visit (from the past 720 hour(s)).  Medical History: Past Medical History  Diagnosis Date  . Anxiety   . Depression   . Arthritis   . Osteoarthritis   . Chronic back pain   . Sciatica     Medications:  Scheduled:   Infusions:  . sodium chloride    . ampicillin-sulbactam (UNASYN) IV    . azithromycin     PRN:   Assessment: 44 year-old F admitted with CAP.  Orders received to begin azithromycin 500 mg IV q24h and Unasyn IV with pharmacy dosing assistance.  Goal of Therapy:  Appropriate antibiotic dosing for indication and renal function; eradication of infection.   Plan:  1. Unasyn 3 grams IV q6h 2. Azithromycin as ordered by M.D. 3. Follow culture results, clinical course, planned DOT.  Elie Goodyandy Kalee Mcclenathan, PharmD, BCPS Pager: [phone] 12/24/2014  11:20 AM

## 2014-12-24 NOTE — ED Notes (Signed)
Patient c/o lower back pain which is normal for her but, the has gotten worse due to cough and congestion for 2 weeks.  Patient has been unable to sleep comfortably for 2 weeks and states that is unable to keep food down.  States when eats has vomiting and diarrhea.  States that took a course of ABX and has not gotten better.  States pain in back 10/10.  Breathing even and unlabored, NAD at this time.

## 2014-12-25 DIAGNOSIS — R05 Cough: Secondary | ICD-10-CM | POA: Diagnosis not present

## 2014-12-25 DIAGNOSIS — J189 Pneumonia, unspecified organism: Secondary | ICD-10-CM | POA: Diagnosis not present

## 2014-12-25 DIAGNOSIS — R112 Nausea with vomiting, unspecified: Secondary | ICD-10-CM

## 2014-12-25 LAB — CBC
HCT: 36.4 % (ref 36.0–46.0)
Hemoglobin: 12 g/dL (ref 12.0–15.0)
MCH: 32.6 pg (ref 26.0–34.0)
MCHC: 33 g/dL (ref 30.0–36.0)
MCV: 98.9 fL (ref 78.0–100.0)
PLATELETS: 462 10*3/uL — AB (ref 150–400)
RBC: 3.68 MIL/uL — ABNORMAL LOW (ref 3.87–5.11)
RDW: 13.2 % (ref 11.5–15.5)
WBC: 11.7 10*3/uL — ABNORMAL HIGH (ref 4.0–10.5)

## 2014-12-25 LAB — EXPECTORATED SPUTUM ASSESSMENT W GRAM STAIN, RFLX TO RESP C

## 2014-12-25 LAB — EXPECTORATED SPUTUM ASSESSMENT W REFEX TO RESP CULTURE

## 2014-12-25 LAB — HIV ANTIBODY (ROUTINE TESTING W REFLEX): HIV Screen 4th Generation wRfx: NONREACTIVE

## 2014-12-25 MED ORDER — HYDROCOD POLST-CPM POLST ER 10-8 MG/5ML PO SUER
5.0000 mL | Freq: Two times a day (BID) | ORAL | Status: DC | PRN
  Administered 2014-12-25 – 2014-12-26 (×2): 5 mL via ORAL
  Filled 2014-12-25 (×2): qty 5

## 2014-12-25 MED ORDER — TIZANIDINE HCL 2 MG PO TABS
2.0000 mg | ORAL_TABLET | Freq: Two times a day (BID) | ORAL | Status: DC | PRN
  Administered 2014-12-25 – 2014-12-26 (×2): 2 mg via ORAL
  Filled 2014-12-25 (×3): qty 2

## 2014-12-25 MED ORDER — IBUPROFEN 200 MG PO TABS
600.0000 mg | ORAL_TABLET | Freq: Four times a day (QID) | ORAL | Status: DC | PRN
  Administered 2014-12-25: 600 mg via ORAL
  Administered 2014-12-26: 400 mg via ORAL
  Filled 2014-12-25 (×3): qty 3

## 2014-12-25 MED ORDER — BENZONATATE 100 MG PO CAPS
200.0000 mg | ORAL_CAPSULE | Freq: Three times a day (TID) | ORAL | Status: DC
  Administered 2014-12-25 – 2014-12-26 (×3): 200 mg via ORAL
  Filled 2014-12-25 (×3): qty 2

## 2014-12-25 MED ORDER — LEVOFLOXACIN 750 MG PO TABS
750.0000 mg | ORAL_TABLET | Freq: Every day | ORAL | Status: DC
  Administered 2014-12-25 – 2014-12-26 (×2): 750 mg via ORAL
  Filled 2014-12-25 (×2): qty 1

## 2014-12-25 MED ORDER — DM-GUAIFENESIN ER 30-600 MG PO TB12
1.0000 | ORAL_TABLET | Freq: Two times a day (BID) | ORAL | Status: DC
  Administered 2014-12-25 – 2014-12-26 (×3): 1 via ORAL
  Filled 2014-12-25 (×3): qty 1

## 2014-12-25 NOTE — Progress Notes (Signed)
PROGRESS NOTE  Judith Hardy ZOX:[id number] DOB: 03/05/1970 DOA: 12/24/2014 PCP: Verlon Au, MD  Assessment/Plan: Community-acquired pneumonia  =patient appears nontoxic- not requiring O2 =levaquin =cough appears to be exacerbating her chronic pain  Chronic lumbar spine pain with chronic left lower extremity weakness requiring a cane to walk. Patient has had multiple visits to all the surrounding hospitals which include [facility], [facility], [facility] and [facility] health care. She has also been to pain clinics. She has been clearly told that for her chronic back pain her home medications will be continued and she will not be given any IV narcotics or narcotic prescription upon discharge. Continue supportive care.  Anxiety and depression continue home medications.   Essential hypertension continue home dose beta blocker.   Code Status: full Family Communication: patient Disposition Plan: home later today or in AM   Consultants:    Procedures:     HPI/Subjective: Cough makes her back pain worse  Objective: Filed Vitals:   12/25/14 0527 12/25/14 1430  BP: 128/63 119/66  Pulse: 72 70  Temp: 98.4 F (36.9 C) 98 F (36.7 C)  Resp: 18 16    Intake/Output Summary (Last 24 hours) at 12/25/14 1542 Last data filed at 12/25/14 1230  Gross per 24 hour  Intake   1940 ml  Output    950 ml  Net    990 ml   Filed Weights   12/24/14 1230  Weight: 106.4 kg (234 lb 9.1 oz)    Exam:   General:  Awake, appears uncomfortable  Cardiovascular: rrr  Respiratory: clear  Abdomen: +BS, soft  Musculoskeletal: no edema   Data Reviewed: Basic Metabolic Panel:  Recent Labs Lab 12/24/14 0858  NA 140  K 3.8  CL 104  CO2 26  GLUCOSE 116*  BUN 10  CREATININE 0.93  CALCIUM 9.1   Liver Function Tests:  Recent Labs Lab 12/24/14 0858  AST 23  ALT 29  ALKPHOS 89  BILITOT 0.5  PROT 7.5  ALBUMIN 3.7    Recent Labs Lab 12/24/14 0858  LIPASE 24   No results for  input(s): AMMONIA in the last 168 hours. CBC:  Recent Labs Lab 12/24/14 0858 12/25/14 0440  WBC 12.2* 11.7*  NEUTROABS 7.9*  --   HGB 12.6 12.0  HCT 38.1 36.4  MCV 98.4 98.9  PLT 504* 462*   Cardiac Enzymes: No results for input(s): CKTOTAL, CKMB, CKMBINDEX, TROPONINI in the last 168 hours. BNP (last 3 results) No results for input(s): BNP in the last 8760 hours.  ProBNP (last 3 results) No results for input(s): PROBNP in the last 8760 hours.  CBG: No results for input(s): GLUCAP in the last 168 hours.  Recent Results (from the past 240 hour(s))  Culture, blood (routine x 2)     Status: None (Preliminary result)   Collection Time: 12/24/14 11:52 AM  Result Value Ref Range Status   Specimen Description BLOOD RIGHT ANTECUBITAL  Final   Special Requests BOTTLES DRAWN AEROBIC AND ANAEROBIC  Final   Culture   Final    NO GROWTH < 24 HOURS Performed at [facility]    Report Status PENDING  Incomplete  Culture, blood (routine x 2)     Status: None (Preliminary result)   Collection Time: 12/24/14 11:57 AM  Result Value Ref Range Status   Specimen Description BLOOD LEFT ANTECUBITAL  Final   Special Requests BOTTLES DRAWN AEROBIC AND ANAEROBIC  Final   Culture   Final    NO  GROWTH < 24 HOURS [facility]    Report Status PENDING  Incomplete  Urine culture     Status: None (Preliminary result)   Collection Time: 12/24/14  3:04 PM  Result Value Ref Range Status   Specimen Description URINE, RANDOM  Final   Special Requests NONE  Final   Culture   Final    TOO YOUNG TO READ [facility]    Report Status PENDING  Incomplete     Studies: Dg Chest 2 View  12/24/2014  CLINICAL DATA:  Productive dry cough EXAM: CHEST  2 VIEW COMPARISON:  06/25/2014 FINDINGS: There is right upper lobe airspace disease. There no other focal parenchymal opacity. There is no pleural effusion or pneumothorax. The heart and mediastinal  contours are unremarkable. The osseous structures are unremarkable. IMPRESSION: Right upper lobe pneumonia. Electronically Signed   By: Elige KoHetal  Patel   On: 12/24/2014 09:35    Scheduled Meds: . benzonatate  200 mg Oral TID  . dextromethorphan-guaiFENesin  1 tablet Oral BID  . enoxaparin (LOVENOX) injection  40 mg Subcutaneous Q24H  . gabapentin  900 mg Oral QHS  . levofloxacin  750 mg Oral Daily  . propranolol  10 mg Oral BID  . venlafaxine XR  300 mg Oral Daily   Continuous Infusions:  Antibiotics Given (last 72 hours)    Date/Time Action Medication Dose Rate   12/24/14 1545 Given   Ampicillin-Sulbactam (UNASYN) 3 g in sodium chloride 0.9 % 100 mL IVPB 3 g 100 mL/hr   12/24/14 1558 Given   azithromycin (ZITHROMAX) 500 mg in dextrose 5 % 250 mL IVPB 500 mg 250 mL/hr   12/24/14 1800 Given   Ampicillin-Sulbactam (UNASYN) 3 g in sodium chloride 0.9 % 100 mL IVPB 3 g 100 mL/hr   12/25/14 0051 Given   Ampicillin-Sulbactam (UNASYN) 3 g in sodium chloride 0.9 % 100 mL IVPB 3 g 100 mL/hr   12/25/14 0604 Given   Ampicillin-Sulbactam (UNASYN) 3 g in sodium chloride 0.9 % 100 mL IVPB 3 g 100 mL/hr   12/25/14 1001 Given   azithromycin (ZITHROMAX) 500 mg in dextrose 5 % 250 mL IVPB 500 mg 250 mL/hr   12/25/14 1247 Given   levofloxacin (LEVAQUIN) tablet 750 mg 750 mg       Principal Problem:   CAP (community acquired pneumonia) Active Problems:   HNP (herniated nucleus pulposus), lumbar   Nausea & vomiting    Time spent: 25 min    Adelheid Hoggard [facility]  [facility] Pager [phone]. If 7PM-7AM, please contact night-coverage at [email or web address], password [email or web address] 12/25/2014, 3:42 PM  LOS: 1 day

## 2014-12-25 NOTE — Progress Notes (Signed)
PT Note  Patient Details Name: Judith Hardy MRN: [id number] DOB: 03/28/1970   Chart reviewed, and nurse and CNA stated she has been going to bathroom independently. Spoke with patient who has great pain with intraabdominal pressure caused by coughing which is exacerbating her bugling disc (according to her). We talked at length about what to do, body mechanics, and pt very aware of all this and has been managing through this since 2012. The pain is just increased lately and with radicular pain down the hip and leg as well. She has a TENS unit, attends OPPT and goes to water aerobics so has a lot of good things in place once this gets back to a manageable level. Pt agreed , not a lot I can do for her at this time. Signing off. Thanks     Marella BileBRITT, Oluwaseun Bruyere 12/25/2014, 12:23 PM  Marella BileSharron Jemel Ono, PT Pager: [phone] 12/25/2014

## 2014-12-26 DIAGNOSIS — J189 Pneumonia, unspecified organism: Secondary | ICD-10-CM | POA: Diagnosis not present

## 2014-12-26 DIAGNOSIS — R112 Nausea with vomiting, unspecified: Secondary | ICD-10-CM | POA: Diagnosis not present

## 2014-12-26 DIAGNOSIS — M5126 Other intervertebral disc displacement, lumbar region: Secondary | ICD-10-CM | POA: Diagnosis not present

## 2014-12-26 DIAGNOSIS — R05 Cough: Secondary | ICD-10-CM | POA: Diagnosis not present

## 2014-12-26 LAB — URINE CULTURE

## 2014-12-26 MED ORDER — BACID PO TABS: 2.0000 | ORAL_TABLET | Freq: Three times a day (TID) | ORAL | Status: DC

## 2014-12-26 MED ORDER — LEVOFLOXACIN 750 MG PO TABS: 750.0000 mg | ORAL_TABLET | Freq: Every day | ORAL | Status: DC

## 2014-12-26 MED ORDER — HYDROCOD POLST-CPM POLST ER 10-8 MG/5ML PO SUER: 5.0000 mL | Freq: Two times a day (BID) | ORAL | Status: DC | PRN

## 2014-12-26 MED ORDER — BENZONATATE 200 MG PO CAPS: 200.0000 mg | ORAL_CAPSULE | Freq: Three times a day (TID) | ORAL | Status: DC

## 2014-12-26 MED ORDER — BACID PO TABS
2.0000 | ORAL_TABLET | Freq: Three times a day (TID) | ORAL | Status: DC
  Filled 2014-12-26 (×2): qty 2

## 2014-12-26 NOTE — Discharge Summary (Signed)
Physician Discharge Summary  Mashal Slavick WUJ:[id number] DOB: Feb 05, 1970 DOA: 12/24/2014  PCP: Verlon Au, MD  Admit date: 12/24/2014 Discharge date: 12/27/2014  Time spent: 35 minutes  Recommendations for Outpatient Follow-up:  1.    Discharge Diagnoses:  Principal Problem:   CAP (community acquired pneumonia) Active Problems:   HNP (herniated nucleus pulposus), lumbar   Nausea & vomiting   Discharge Condition: improved  Diet recommendation: cardiac  Filed Weights   12/24/14 1230  Weight: 106.4 kg (234 lb 9.1 oz)    History of present illness:  Judith Hardy is a 44 y.o. female, chronic L-spine pain and left lower extremity weakness needing a cane to walk, has been followed at [facility] [place] with different pain clinics for back pain, perineum vehicle hernia repair surgery by Dr. Johna Sheriff few months ago with healed and normal-looking postop site but patient has pain around that site on a chronic basis, anxiety and depression. Patient with above history comes to the ER with 7 to ten-day history of mildly productive cough with intermittent nausea and vomiting, she has also developed mild diarrhea, she came to the hospital for ongoing cough.  The ER workup suggestive of right upper lobe infiltrate due to community-acquired pneumonia, her lab work suggested mild leukocytosis, her vital signs were stable without any signs of hypoxia. I was called to keep the patient in hospital for a 23 observation.  Hospital Course:  Community-acquired pneumonia  -patient appears nontoxic- not requiring O2 -levaquin -cough appears to be exacerbating her chronic pain- controlled better with tessalon pearls and   Chronic lumbar spine pain with chronic left lower extremity weakness requiring a cane to walk. Patient has had multiple visits to all the surrounding hospitals which include [facility], [facility], [facility] and [facility] health care. She has also been to pain clinics. She has been  clearly told that for her chronic back pain her home medications will be continued and she will not be given any IV narcotics or narcotic prescription upon discharge. Continue supportive care.  Anxiety and depression continue home medications.  Essential hypertension continue home dose beta blocker.   Diarrhea -resolved to formed BMS and then back to some loose stools (not seen by nursing) -related to abx most likely- probiotics  Procedures:    Consultations:    Discharge Exam: Filed Vitals:   12/25/14 2047 12/26/14 0600  BP: 128/58 101/52  Pulse: 77 67  Temp: 97.6 F (36.4 C) 97.7 F (36.5 C)  Resp: 16 16    General: more relaxed appearing   Discharge Instructions   Discharge Instructions    Diet general    Complete by:  As directed      Discharge instructions    Complete by:  As directed   No driving while taking pain meds/cough syrup     Increase activity slowly    Complete by:  As directed           Discharge Medication List as of 12/26/2014  1:01 PM    START taking these medications   Details  benzonatate (TESSALON) 200 MG capsule Take 1 capsule (200 mg total) by mouth 3 (three) times daily., Starting 12/26/2014, Until Discontinued, Normal    chlorpheniramine-HYDROcodone (TUSSIONEX) 10-8 MG/5ML SUER Take 5 mLs by mouth every 12 (twelve) hours as needed for cough., Starting 12/26/2014, Until Discontinued, Normal    lactobacillus acidophilus (BACID) TABS tablet Take 2 tablets by mouth 3 (three) times daily., Starting 12/26/2014, Until Discontinued, Normal    levofloxacin (LEVAQUIN) 750 MG  tablet Take 1 tablet (750 mg total) by mouth daily., Starting 12/26/2014, Until Discontinued, Normal      CONTINUE these medications which have NOT CHANGED   Details  diazepam (VALIUM) 10 MG tablet Take 10 mg by mouth at bedtime as needed (For anxiety or panic attacks.). , Starting 10/07/2014, Until Discontinued, Historical Med    EVZIO 0.4 MG/0.4ML SOAJ 0.4 mg once  as needed (emergency opiod overdose). , Starting 11/26/2014, Until Discontinued, Historical Med    gabapentin (NEURONTIN) 300 MG capsule Take 900 mg by mouth at bedtime., Until Discontinued, Historical Med    HYDROmorphone (DILAUDID) 4 MG tablet Take 4 mg by mouth 2 (two) times daily as needed for severe pain., Until Discontinued, Historical Med    ibuprofen (ADVIL,MOTRIN) 200 MG tablet Take 400 mg by mouth every 6 (six) hours as needed for moderate pain., Until Discontinued, Historical Med    NON FORMULARY Take 4 capsules by mouth daily. super lysine, Until Discontinued, Historical Med    propranolol (INDERAL) 10 MG tablet Take 10 mg by mouth 2 (two) times daily., Until Discontinued, Historical Med    tiZANidine (ZANAFLEX) 4 MG tablet Take 2-4 mg by mouth 2 (two) times daily as needed for muscle spasms. 4 mg at bedtime and 2mg  in the morning if needed, Starting 11/03/2014, Until Discontinued, Historical Med    traZODone (DESYREL) 100 MG tablet Take 250 mg by mouth at bedtime as needed for sleep. , Until Discontinued, Historical Med    venlafaxine XR (EFFEXOR-XR) 150 MG 24 hr capsule Take 300 mg by mouth daily., Starting 09/21/2014, Until Discontinued, Historical Med      STOP taking these medications     amoxicillin-clavulanate (AUGMENTIN) 875-125 MG per tablet      oxyCODONE-acetaminophen (PERCOCET/ROXICET) 5-325 MG tablet        Allergies  Allergen Reactions  . Acetaminophen Other (See Comments)    Headache  . Hydrocodone Itching   Follow-up Information    Follow up with Verlon AuBoyd, Tammy Lamonica, MD In 1 week.   Specialty:  Family Medicine   Contact information:   [address] [facility] [place] [address] [phone]        The results of significant diagnostics from this hospitalization (including imaging, microbiology, ancillary and laboratory) are listed below for reference.    Significant Diagnostic Studies: Dg Chest 2 View  12/24/2014   CLINICAL DATA:  Productive dry cough EXAM: CHEST  2 VIEW COMPARISON:  06/25/2014 FINDINGS: There is right upper lobe airspace disease. There no other focal parenchymal opacity. There is no pleural effusion or pneumothorax. The heart and mediastinal contours are unremarkable. The osseous structures are unremarkable. IMPRESSION: Right upper lobe pneumonia. Electronically Signed   By: Elige KoHetal  Patel   On: 12/24/2014 09:35    Microbiology: Recent Results (from the past 240 hour(s))  Culture, blood (routine x 2)     Status: None (Preliminary result)   Collection Time: 12/24/14 11:52 AM  Result Value Ref Range Status   Specimen Description BLOOD RIGHT ANTECUBITAL  Final   Special Requests BOTTLES DRAWN AEROBIC AND ANAEROBIC 5ML  Final   Culture   Final    NO GROWTH 2 DAYS Performed at [facility]    Report Status PENDING  Incomplete  Culture, blood (routine x 2)     Status: None (Preliminary result)   Collection Time: 12/24/14 11:57 AM  Result Value Ref Range Status   Specimen Description BLOOD LEFT ANTECUBITAL  Final   Special Requests  BOTTLES DRAWN AEROBIC AND ANAEROBIC  Final   Culture   Final    NO GROWTH 2 DAYS Performed at [facility]    Report Status PENDING  Incomplete  Urine culture     Status: None   Collection Time: 12/24/14  3:04 PM  Result Value Ref Range Status   Specimen Description URINE, RANDOM  Final   Special Requests NONE  Final   Culture   Final    MULTIPLE SPECIES PRESENT, SUGGEST RECOLLECTION Performed at [facility]    Report Status 12/26/2014 FINAL  Final  Culture, expectorated sputum-assessment     Status: None   Collection Time: 12/25/14  9:44 PM  Result Value Ref Range Status   Specimen Description SPU  Final   Special Requests NONE  Final   Sputum evaluation   Final    MICROSCOPIC FINDINGS SUGGEST THAT THIS SPECIMEN IS NOT REPRESENTATIVE OF LOWER RESPIRATORY SECRETIONS. PLEASE RECOLLECT. [facility] RN 2024 [id number] [place],N    Report Status 12/25/2014 FINAL  Final     Labs: Basic Metabolic Panel:  Recent Labs Lab 12/24/14 0858  NA 140  K 3.8  CL 104  CO2 26  GLUCOSE 116*  BUN 10  CREATININE 0.93  CALCIUM 9.1   Liver Function Tests:  Recent Labs Lab 12/24/14 0858  AST 23  ALT 29  ALKPHOS 89  BILITOT 0.5  PROT 7.5  ALBUMIN 3.7    Recent Labs Lab 12/24/14 0858  LIPASE 24   No results for input(s): AMMONIA in the last 168 hours. CBC:  Recent Labs Lab 12/24/14 0858 12/25/14 0440  WBC 12.2* 11.7*  NEUTROABS 7.9*  --   HGB 12.6 12.0  HCT 38.1 36.4  MCV 98.4 98.9  PLT 504* 462*   Cardiac Enzymes: No results for input(s): CKTOTAL, CKMB, CKMBINDEX, TROPONINI in the last 168 hours. BNP: BNP (last 3 results) No results for input(s): BNP in the last 8760 hours.  ProBNP (last 3 results) No results for input(s): PROBNP in the last 8760 hours.  CBG: No results for input(s): GLUCAP in the last 168 hours.     Signed:  Shantae Vantol Juanetta Gosling  [facility] 12/27/2014, 12:35 PM

## 2014-12-26 NOTE — Progress Notes (Signed)
Patient discharged to home, discharge instructions reviewed with patient who verbalized understanding. New Rx given to patient.

## 2014-12-29 LAB — CULTURE, BLOOD (ROUTINE X 2)
CULTURE: NO GROWTH
CULTURE: NO GROWTH

## 2015-01-03 ENCOUNTER — Emergency Department (HOSPITAL_COMMUNITY): Payer: Medicare HMO

## 2015-01-03 ENCOUNTER — Emergency Department (HOSPITAL_COMMUNITY)
Admission: EM | Admit: 2015-01-03 | Discharge: 2015-01-03 | Disposition: A | Discharge status: Home / Self Care | Payer: Medicare HMO | Attending: Emergency Medicine | Admitting: Emergency Medicine

## 2015-01-03 ENCOUNTER — Encounter (HOSPITAL_COMMUNITY): Payer: Self-pay | Admitting: Emergency Medicine

## 2015-01-03 DIAGNOSIS — F419 Anxiety disorder, unspecified: Secondary | ICD-10-CM | POA: Diagnosis not present

## 2015-01-03 DIAGNOSIS — G8929 Other chronic pain: Secondary | ICD-10-CM | POA: Insufficient documentation

## 2015-01-03 DIAGNOSIS — S8992XA Unspecified injury of left lower leg, initial encounter: Secondary | ICD-10-CM | POA: Insufficient documentation

## 2015-01-03 DIAGNOSIS — Z79899 Other long term (current) drug therapy: Secondary | ICD-10-CM | POA: Insufficient documentation

## 2015-01-03 DIAGNOSIS — Z87891 Personal history of nicotine dependence: Secondary | ICD-10-CM | POA: Insufficient documentation

## 2015-01-03 DIAGNOSIS — Y998 Other external cause status: Secondary | ICD-10-CM | POA: Insufficient documentation

## 2015-01-03 DIAGNOSIS — M1990 Unspecified osteoarthritis, unspecified site: Secondary | ICD-10-CM | POA: Insufficient documentation

## 2015-01-03 DIAGNOSIS — F329 Major depressive disorder, single episode, unspecified: Secondary | ICD-10-CM | POA: Insufficient documentation

## 2015-01-03 DIAGNOSIS — M5442 Lumbago with sciatica, left side: Secondary | ICD-10-CM

## 2015-01-03 DIAGNOSIS — W1839XA Other fall on same level, initial encounter: Secondary | ICD-10-CM | POA: Insufficient documentation

## 2015-01-03 DIAGNOSIS — Y9389 Activity, other specified: Secondary | ICD-10-CM | POA: Insufficient documentation

## 2015-01-03 DIAGNOSIS — Y9289 Other specified places as the place of occurrence of the external cause: Secondary | ICD-10-CM | POA: Insufficient documentation

## 2015-01-03 DIAGNOSIS — S8991XA Unspecified injury of right lower leg, initial encounter: Secondary | ICD-10-CM | POA: Insufficient documentation

## 2015-01-03 DIAGNOSIS — M25561 Pain in right knee: Secondary | ICD-10-CM

## 2015-01-03 MED ORDER — PREDNISONE 50 MG PO TABS: 50.0000 mg | ORAL_TABLET | Freq: Every day | ORAL | Status: DC

## 2015-01-03 MED ORDER — LIDOCAINE 5 % EX PTCH
2.0000 | MEDICATED_PATCH | CUTANEOUS | Status: DC
  Administered 2015-01-03: 2 via TRANSDERMAL
  Filled 2015-01-03: qty 2

## 2015-01-03 MED ORDER — PREDNISONE 20 MG PO TABS
60.0000 mg | ORAL_TABLET | Freq: Once | ORAL | Status: AC
  Administered 2015-01-03: 60 mg via ORAL
  Filled 2015-01-03: qty 3

## 2015-01-03 MED ORDER — IBUPROFEN 800 MG PO TABS: 800.0000 mg | ORAL_TABLET | Freq: Three times a day (TID) | ORAL | Status: DC

## 2015-01-03 MED ORDER — KETOROLAC TROMETHAMINE 60 MG/2ML IM SOLN
60.0000 mg | Freq: Once | INTRAMUSCULAR | Status: AC
  Administered 2015-01-03: 60 mg via INTRAMUSCULAR
  Filled 2015-01-03: qty 2

## 2015-01-03 MED ORDER — METHOCARBAMOL 500 MG PO TABS
1000.0000 mg | ORAL_TABLET | Freq: Once | ORAL | Status: AC
  Administered 2015-01-03: 1000 mg via ORAL
  Filled 2015-01-03: qty 2

## 2015-01-03 NOTE — Discharge Instructions (Signed)
You have been seen today for evaluation following a fall. Your imaging showed no abnormalities. Follow-up with orthopedics for chronic management of your pain.  Follow up with PCP as needed. Return to ED should symptoms worsen.

## 2015-01-03 NOTE — ED Notes (Signed)
Pt complaint of fall in kitchen last night resulting in new onset right knee pain and exacerbation of chronic back pain. Ambulatory with cane to triage.

## 2015-01-03 NOTE — Progress Notes (Addendum)
Pt states she fell in her kitchen and now has low back pain and right knee pain.Pt stated ,"the medications I got will probably not work. Pt was taken for x-ray earlier. Lidociane patches were applied.pt requested a knee immobolizer for her right knee. Ortho tech was called at 4:45pm. Pt stated she ran out of her dialudid last pm . Instructed the pt to follow up with her ortho surgeon.

## 2015-01-03 NOTE — ED Provider Notes (Signed)
CSN: [id number]     Arrival date & time 01/03/15  1356 History  By signing my name below, I, Judith Hardy, attest that this documentation has been prepared under the direction and in the presence of Horacio Werth,PA-C. Electronically Signed: Phillis HaggisGabriella Hardy, ED Scribe. 01/03/2015. 2:49 PM.   Chief Complaint  Patient presents with  . Fall   The history is provided by the patient. No language interpreter was used.  HPI Comments: Judith Hardy is a 44 y.o. Female with a hx of arthritis, osteoarthritis, chronic back pain, multiple falls, and sciatica who presents to the Emergency Department complaining of a fall onset one day ago. Pt states that she was in the kitchen last night when she fell onto her left side. She reports gradually worsening, constant, stabbing, throbbing, right knee pain and worsening of her chronic back pain that radiates to the back of her left leg. She rates her pain 10/10. She reports worsening pain with extension of the legs. Pt has been taking 600 mg ibuprofen and regularly takes Dilaudid. She denies relief from her pain medication. Pt ambulatory to ED with her cane without other assistance. She denies hx of kidney disease, nausea, vomiting, hitting head, LOC, neuro deficits, urinary complaints, or any other pain or complaints.   Past Medical History  Diagnosis Date  . Anxiety   . Depression   . Arthritis   . Osteoarthritis   . Chronic back pain   . Sciatica    Past Surgical History  Procedure Laterality Date  . Appendectomy  2011  . Lumbar laminectomy/decompression microdiscectomy  01/10/2011    Procedure: LUMBAR LAMINECTOMY/DECOMPRESSION MICRODISCECTOMY;  Surgeon: [name];  Location: [facility];  Service: Orthopedics;  Laterality: N/A;  Decompression L4 - L5  (X-Ray)  . Lumbar laminectomy/decompression microdiscectomy  08/02/2011    Procedure: LUMBAR LAMINECTOMY/DECOMPRESSION MICRODISCECTOMY;  Surgeon: [name], MD;  Location: [facility];  Service: Orthopedics;   Laterality: N/A;  Re-do Decompression L4-L5  . Back surgery    . Hernia repair     Family History  Problem Relation Age of Onset  . Heart failure Mother    Social History  Substance Use Topics  . Smoking status: Former Games developermoker  . Smokeless tobacco: Never Used  . Alcohol Use: No   OB History    No data available     Review of Systems  Constitutional: Negative for fever, chills and diaphoresis.  Respiratory: Negative for shortness of breath.   Cardiovascular: Negative for chest pain and leg swelling.  Gastrointestinal: Negative for nausea and vomiting.  Musculoskeletal: Positive for back pain and arthralgias (Right knee pain). Negative for neck pain and neck stiffness.  Neurological: Negative for syncope, weakness, numbness and headaches.  All other systems reviewed and are negative.  Allergies  Acetaminophen and Hydrocodone  Home Medications   Prior to Admission medications   Medication Sig Start Date End Date Taking? Authorizing Provider  benzonatate (TESSALON) 200 MG capsule Take 1 capsule (200 mg total) by mouth 3 (three) times daily. 12/26/14   Joseph ArtJessica U Vann, DO  chlorpheniramine-HYDROcodone (TUSSIONEX) 10-8 MG/5ML SUER Take 5 mLs by mouth every 12 (twelve) hours as needed for cough. 12/26/14   Joseph ArtJessica U Vann, DO  diazepam (VALIUM) 10 MG tablet Take 10 mg by mouth at bedtime as needed (For anxiety or panic attacks.).  10/07/14   Historical Provider, MD  EVZIO 0.4 MG/0.4ML SOAJ 0.4 mg once as needed (emergency opiod overdose).  11/26/14   Historical Provider, MD  gabapentin (NEURONTIN)  300 MG capsule Take 900 mg by mouth at bedtime.    Historical Provider, MD  HYDROmorphone (DILAUDID) 4 MG tablet Take 4 mg by mouth 2 (two) times daily as needed for severe pain.    Historical Provider, MD  ibuprofen (ADVIL,MOTRIN) 200 MG tablet Take 400 mg by mouth every 6 (six) hours as needed for moderate pain.    Historical Provider, MD  ibuprofen (ADVIL,MOTRIN) 800 MG tablet Take 1 tablet  (800 mg total) by mouth 3 (three) times daily. 01/03/15   Aleysia Oltmann C Maryanne Huneycutt, PA-C  lactobacillus acidophilus (BACID) TABS tablet Take 2 tablets by mouth 3 (three) times daily. 12/26/14   Joseph Art, DO  levofloxacin (LEVAQUIN) 750 MG tablet Take 1 tablet (750 mg total) by mouth daily. 12/26/14   Joseph Art, DO  NON FORMULARY Take 4 capsules by mouth daily. super lysine    Historical Provider, MD  predniSONE (DELTASONE) 50 MG tablet Take 1 tablet (50 mg total) by mouth daily. 01/03/15   Betsey Sossamon C Pina Sirianni, PA-C  propranolol (INDERAL) 10 MG tablet Take 10 mg by mouth 2 (two) times daily.    Historical Provider, MD  tiZANidine (ZANAFLEX) 4 MG tablet Take 2-4 mg by mouth 2 (two) times daily as needed for muscle spasms. 4 mg at bedtime and  in the morning if needed 11/03/14   Historical Provider, MD  traZODone (DESYREL) 100 MG tablet Take 250 mg by mouth at bedtime as needed for sleep.     Historical Provider, MD  venlafaxine XR (EFFEXOR-XR) 150 MG 24 hr capsule Take 300 mg by mouth daily. 09/21/14   Historical Provider, MD   BP 124/76 mmHg  Pulse 76  Temp(Src) 98.3 F (36.8 C) (Oral)  Resp 20  Ht  (1.702 m)  Wt 104.327 kg  BMI 36.01 kg/m2  SpO2 100% Physical Exam  Constitutional: She is oriented to person, place, and time. She appears well-developed and well-nourished.  HENT:  Head: Normocephalic and atraumatic.  Eyes: Conjunctivae and EOM are normal. Pupils are equal, round, and reactive to light.  Neck: Normal range of motion. Neck supple.  Cardiovascular: Normal rate.   Pulmonary/Chest: Effort normal.  Musculoskeletal: Normal range of motion.  Tenderness to medial, lateral, and anterior surface of the right patella. No obvious displacement or deformity. ROM intact; tenderness to left lumbar musculature. No paraspinal tenderness.  Neurological: She is alert and oriented to person, place, and time. She has normal reflexes.  No sensory deficits. Strength 5/5 in all extremities. No gait  disturbance. Cranial nerves III-XII grossly intact. No facial droop.  Skin: Skin is warm and dry.  Psychiatric: She has a normal mood and affect. Her behavior is normal.  Nursing note and vitals reviewed.   ED Course  Procedures (including critical care time) DIAGNOSTIC STUDIES: Oxygen Saturation is 100% on RA, normal by my interpretation.    COORDINATION OF CARE: 2:48 PM-Discussed treatment plan which includes Toradol injection in the ED with pt at bedside and pt agreed to plan.    Labs Review Labs Reviewed - No data to display  Imaging Review Dg Knee Complete 4 Views Right  01/03/2015  CLINICAL DATA:  Right lateral medial knee pain, fall yesterday EXAM: RIGHT KNEE - COMPLETE 4+ VIEW COMPARISON:  None. FINDINGS: Four views of the right knee submitted. No acute fracture or subluxation. No joint effusion. Mild narrowing of patellofemoral joint space. IMPRESSION: No acute fracture or subluxation. Mild narrowing of patellofemoral joint space. Electronically Signed   By: Lang Snow  Pop M.D.   On: 01/03/2015 15:49   I have personally reviewed and evaluated these images and lab results as part of my medical decision-making.   EKG Interpretation None      MDM   Final diagnoses:  Left-sided low back pain with left-sided sciatica  Right knee pain    Judith Hardy presents with acute on chronic left lower back pain with sciatica symptoms and right knee pain.  Patient's home medication is stronger than the medication that we could provide in the ED. Patient got some relief from the conservative treatment provided here in the ED. Additionally, patient states that she is driving home today and thus strong pain medications cannot be administered. Patient was advised to follow-up with orthopedics or her pain clinic after discharge. She was in home care instructions as well as return precautions. Patient voiced understanding of these instructions, accepts the plan, and is comfortable with  discharge.  I personally performed the services described in this documentation, which was scribed in my presence. The recorded information has been reviewed and is accurate. Filed Vitals:   01/03/15 1411 01/03/15 1620  BP: 120/59 124/76  Pulse: 108 76  Temp: 97.5 F (36.4 C) 98.3 F (36.8 C)  TempSrc: Oral   Resp: 18 20  Height:  (1.702 m)   Weight: 104.327 kg   SpO2: 100% 100%      Anselm Pancoast, PA-C 01/03/15 1727  [name], MD 01/06/15 1027

## 2015-02-25 ENCOUNTER — Other Ambulatory Visit: Payer: Self-pay | Admitting: Orthopaedic Surgery

## 2015-02-25 NOTE — Pre-Procedure Instructions (Addendum)
Afsheen Fayrene Fearing  02/25/2015      [facility] [address] [address], [place] - [address] AT [facility] [address] [place] [address] Phone: [phone] Fax: [phone]    Your procedure is scheduled on Feb 28  Report to [facility] Admitting at 530 A.M.  Call this number if you have problems the morning of surgery:  [phone]   Remember:  Do not eat food or drink liquids after midnight.  Take these medicines the morning of surgery with A SIP OF WATER clonazepam (Klonopin), Hydromorphone (Dilaudid) if needed, Propranolol (Inderal), Venlafaxine XR (Effexor)  Stop taking aspirin, Ibuprofen, Advil, Motrin, BC's, Goody's, Herbal medications, Fish Oil ,lysine 03/03/15   Do not wear jewelry, make-up or nail polish.  Do not wear lotions, powders, or perfumes.  You may wear deodorant.  Do not shave 48 hours prior to surgery.  Men may shave face and neck.  Do not bring valuables to the hospital.  [facility] is not responsible for any belongings or valuables.  Contacts, dentures or bridgework may not be worn into surgery.  Leave your suitcase in the car.  After surgery it may be brought to your room.  For patients admitted to the hospital, discharge time will be determined by your treatment team.  Patients discharged the day of surgery will not be allowed to drive home.    Special instructions:  [place] - Preparing for Surgery  Before surgery, you can play an important role.  Because skin is not sterile, your skin needs to be as free of germs as possible.  You can reduce the number of germs on you skin by washing with CHG (chlorahexidine gluconate) soap before surgery.  CHG is an antiseptic cleaner which kills germs and bonds with the skin to continue killing germs even after washing.  Please DO NOT use if you have an allergy to CHG or antibacterial soaps.  If your skin becomes reddened/irritated stop using the CHG and inform your  nurse when you arrive at Short Stay.  Do not shave (including legs and underarms) for at least 48 hours prior to the first CHG shower.  You may shave your face.  Please follow these instructions carefully:   1.  Shower with CHG Soap the night before surgery and the    morning of Surgery.  2.  If you choose to wash your hair, wash your hair first as usual with your   normal shampoo.  3.  After you shampoo, rinse your hair and body thoroughly to remove the   Shampoo.  4.  Use CHG as you would any other liquid soap.  You can apply chg directly    to the skin and wash gently with scrungie or a clean washcloth.  5.  Apply the CHG Soap to your body ONLY FROM THE NECK DOWN.   Do not use on open wounds or open sores.  Avoid contact with your eyes,   ears, mouth and genitals (private parts).  Wash genitals (private parts)  with your normal soap.  6.  Wash thoroughly, paying special attention to the area where your surgery  will be performed.  7.  Thoroughly rinse your body with warm water from the neck down.  8.  DO NOT shower/wash with your normal soap after using and rinsing off  the CHG Soap.  9.  Pat yourself dry with a clean towel.  10.  Wear clean pajamas.            11.  Place clean sheets on your bed the night of your first shower and do not sleep with pets.  Day of Surgery  Do not apply any lotions/deoderants the morning of surgery.  Please wear clean clothes to the hospital/surgery center.     Please read over the following fact sheets that you were given. Pain Booklet, Coughing and Deep Breathing, Blood Transfusion Information, Total Joint Packet, MRSA Information and Surgical Site Infection Prevention

## 2015-02-28 ENCOUNTER — Encounter (HOSPITAL_COMMUNITY)
Admission: RE | Admit: 2015-02-28 | Discharge: 2015-02-28 | Disposition: A | Discharge status: Home / Self Care | Payer: Medicare HMO | Source: Ambulatory Visit | Attending: Orthopaedic Surgery | Admitting: Orthopaedic Surgery

## 2015-02-28 ENCOUNTER — Encounter (HOSPITAL_COMMUNITY): Payer: Self-pay

## 2015-02-28 DIAGNOSIS — Z0183 Encounter for blood typing: Secondary | ICD-10-CM | POA: Diagnosis not present

## 2015-02-28 DIAGNOSIS — Z01812 Encounter for preprocedural laboratory examination: Secondary | ICD-10-CM | POA: Insufficient documentation

## 2015-02-28 DIAGNOSIS — M1612 Unilateral primary osteoarthritis, left hip: Secondary | ICD-10-CM | POA: Insufficient documentation

## 2015-02-28 DIAGNOSIS — Z01818 Encounter for other preprocedural examination: Secondary | ICD-10-CM | POA: Insufficient documentation

## 2015-02-28 HISTORY — DX: Pneumonia, unspecified organism: J18.9

## 2015-02-28 LAB — URINALYSIS, ROUTINE W REFLEX MICROSCOPIC
BILIRUBIN URINE: NEGATIVE
GLUCOSE, UA: NEGATIVE mg/dL
HGB URINE DIPSTICK: NEGATIVE
Ketones, ur: NEGATIVE mg/dL
Leukocytes, UA: NEGATIVE
Nitrite: NEGATIVE
PROTEIN: NEGATIVE mg/dL
Specific Gravity, Urine: 1.033 — ABNORMAL HIGH (ref 1.005–1.030)
pH: 5 (ref 5.0–8.0)

## 2015-02-28 LAB — CBC WITH DIFFERENTIAL/PLATELET
BASOS ABS: 0 10*3/uL (ref 0.0–0.1)
BASOS PCT: 0 %
Eosinophils Absolute: 0 10*3/uL (ref 0.0–0.7)
Eosinophils Relative: 0 %
HEMATOCRIT: 41.5 % (ref 36.0–46.0)
HEMOGLOBIN: 13.4 g/dL (ref 12.0–15.0)
LYMPHS PCT: 18 %
Lymphs Abs: 2.1 10*3/uL (ref 0.7–4.0)
MCH: 32.1 pg (ref 26.0–34.0)
MCHC: 32.3 g/dL (ref 30.0–36.0)
MCV: 99.5 fL (ref 78.0–100.0)
Monocytes Absolute: 0.7 10*3/uL (ref 0.1–1.0)
Monocytes Relative: 6 %
NEUTROS ABS: 8.6 10*3/uL — AB (ref 1.7–7.7)
NEUTROS PCT: 76 %
Platelets: 440 10*3/uL — ABNORMAL HIGH (ref 150–400)
RBC: 4.17 MIL/uL (ref 3.87–5.11)
RDW: 13.8 % (ref 11.5–15.5)
WBC: 11.6 10*3/uL — AB (ref 4.0–10.5)

## 2015-02-28 LAB — BASIC METABOLIC PANEL
ANION GAP: 11 (ref 5–15)
BUN: 11 mg/dL (ref 6–20)
CALCIUM: 9.6 mg/dL (ref 8.9–10.3)
CO2: 23 mmol/L (ref 22–32)
Chloride: 104 mmol/L (ref 101–111)
Creatinine, Ser: 0.85 mg/dL (ref 0.44–1.00)
GLUCOSE: 100 mg/dL — AB (ref 65–99)
POTASSIUM: 3.9 mmol/L (ref 3.5–5.1)
Sodium: 138 mmol/L (ref 135–145)

## 2015-02-28 LAB — PROTIME-INR
INR: 1.03 (ref 0.00–1.49)
Prothrombin Time: 13.7 seconds (ref 11.6–15.2)

## 2015-02-28 LAB — APTT: APTT: 26 s (ref 24–37)

## 2015-02-28 LAB — ABO/RH: ABO/RH(D): A POS

## 2015-02-28 LAB — HCG, SERUM, QUALITATIVE: PREG SERUM: NEGATIVE

## 2015-02-28 LAB — TYPE AND SCREEN
ABO/RH(D): A POS
ANTIBODY SCREEN: NEGATIVE

## 2015-02-28 LAB — SURGICAL PCR SCREEN
MRSA, PCR: NEGATIVE
Staphylococcus aureus: NEGATIVE

## 2015-02-28 NOTE — H&P (Signed)
TOTAL HIP ADMISSION H&P  Patient is admitted for left total hip arthroplasty.  Subjective:  Chief Complaint: left hip pain  HPI: Judith Hardy, 45 y.o. female, has a history of pain and functional disability in the left hip(s) due to arthritis and patient has failed non-surgical conservative treatments for greater than 12 weeks to include NSAID's and/or analgesics, corticosteriod injections, flexibility and strengthening excercises, use of assistive devices, weight reduction as appropriate and activity modification.  Onset of symptoms was gradual starting 5 years ago with gradually worsening course since that time.The patient noted no past surgery on the left hip(s).  Patient currently rates pain in the left hip at 10 out of 10 with activity. Patient has night pain, worsening of pain with activity and weight bearing, trendelenberg gait, pain that interfers with activities of daily living and crepitus. Patient has evidence of subchondral cysts, subchondral sclerosis, periarticular osteophytes and joint space narrowing by imaging studies. This condition presents safety issues increasing the risk of falls.  There is no current active infection.  Patient Active Problem List   Diagnosis Date Noted  . CAP (community acquired pneumonia) 12/24/2014  . Nausea & vomiting 12/24/2014  . Postoperative abdominal pain 05/27/2014  . HNP (herniated nucleus pulposus), lumbar 08/02/2011   Past Medical History  Diagnosis Date  . Anxiety   . Depression   . Arthritis   . Osteoarthritis   . Chronic back pain   . Sciatica   . Hypertension   . Pneumonia     12/16    Past Surgical History  Procedure Laterality Date  . Appendectomy  2011  . Lumbar laminectomy/decompression microdiscectomy  01/10/2011    Procedure: LUMBAR LAMINECTOMY/DECOMPRESSION MICRODISCECTOMY;  Surgeon: Javier Docker;  Location: [facility];  Service: Orthopedics;  Laterality: N/A;  Decompression L4 - L5  (X-Ray)  . Lumbar  laminectomy/decompression microdiscectomy  08/02/2011    Procedure: LUMBAR LAMINECTOMY/DECOMPRESSION MICRODISCECTOMY;  Surgeon: Javier Docker, MD;  Location: [facility];  Service: Orthopedics;  Laterality: N/A;  Re-do Decompression L4-L5  . Back surgery    . Hernia repair  4/16    umbilical    No prescriptions prior to admission   Allergies  Allergen Reactions  . Acetaminophen Other (See Comments)    Headache  . Hydrocodone Itching  . Other Other (See Comments)    NO STEROIDS - causes white blood cell count to go up    Social History  Substance Use Topics  . Smoking status: Never Smoker   . Smokeless tobacco: Never Used     Comment: tried smoking 3 days 2012  . Alcohol Use: No    Family History  Problem Relation Age of Onset  . Heart failure Mother      Review of Systems  Musculoskeletal: Positive for joint pain.       Left hip  All other systems reviewed and are negative.   Objective:  Physical Exam  Constitutional: She is oriented to person, place, and time. She appears well-developed and well-nourished.  HENT:  Head: Normocephalic and atraumatic.  Eyes: Pupils are equal, round, and reactive to light.  Neck: Normal range of motion.  Cardiovascular: Normal rate and regular rhythm.   Respiratory: Effort normal.  GI: Soft.  Musculoskeletal:  Left hip motion is limited and extremely painful.  Sensation and motor function are intact in her feet.  She has palpable pulses at both ankles.  Calves are soft and nontender.  She has normal unlabored respirations.  I do not feel any lymphadenopathy  at the groin on either side.  She is significantly overweight but her BMI is under 40.    Neurological: She is alert and oriented to person, place, and time.  Skin: Skin is warm and dry.  Psychiatric: She has a normal mood and affect. Her behavior is normal. Judgment and thought content normal.    Vital signs in last 24 hours: Temp:  [98.1 F (36.7 C)] 98.1 F (36.7 C) (02/20  0817) Pulse Rate:  [85] 85 (02/20 0817) Resp:  [20] 20 (02/20 0817) BP: (123)/(68) 123/68 mmHg (02/20 0817) SpO2:  [99 %] 99 % (02/20 0817) Weight:  [104.645 kg (230 lb 11.2 oz)] 104.645 kg (230 lb 11.2 oz) (02/20 0817)  Labs:   Estimated body mass index is 36.01 kg/(m^2) as calculated from the following:   Height as of 01/03/15:  (1.702 m).   Weight as of 01/03/15: 104.327 kg (230 lb).   Imaging Review Plain radiographs demonstrate severe degenerative joint disease of the left hip(s). The bone quality appears to be good for age and reported activity level.  Assessment/Plan:  End stage primary arthritis, left hip(s)  The patient history, physical examination, clinical judgement of the provider and imaging studies are consistent with end stage degenerative joint disease of the left hip(s) and total hip arthroplasty is deemed medically necessary. The treatment options including medical management, injection therapy, arthroscopy and arthroplasty were discussed at length. The risks and benefits of total hip arthroplasty were presented and reviewed. The risks due to aseptic loosening, infection, stiffness, dislocation/subluxation,  thromboembolic complications and other imponderables were discussed.  The patient acknowledged the explanation, agreed to proceed with the plan and consent was signed. Patient is being admitted for inpatient treatment for surgery, pain control, PT, OT, prophylactic antibiotics, VTE prophylaxis, progressive ambulation and ADL's and discharge planning.The patient is planning to be discharged to skilled nursing facility

## 2015-03-03 ENCOUNTER — Encounter (HOSPITAL_COMMUNITY): Payer: Self-pay | Admitting: Emergency Medicine

## 2015-03-03 ENCOUNTER — Emergency Department (HOSPITAL_COMMUNITY)
Admission: EM | Admit: 2015-03-03 | Discharge: 2015-03-03 | Disposition: A | Discharge status: Home / Self Care | Payer: Medicare HMO | Attending: Emergency Medicine | Admitting: Emergency Medicine

## 2015-03-03 DIAGNOSIS — S3992XA Unspecified injury of lower back, initial encounter: Secondary | ICD-10-CM | POA: Diagnosis not present

## 2015-03-03 DIAGNOSIS — G8929 Other chronic pain: Secondary | ICD-10-CM | POA: Diagnosis not present

## 2015-03-03 DIAGNOSIS — Z79899 Other long term (current) drug therapy: Secondary | ICD-10-CM | POA: Insufficient documentation

## 2015-03-03 DIAGNOSIS — M62830 Muscle spasm of back: Secondary | ICD-10-CM | POA: Insufficient documentation

## 2015-03-03 DIAGNOSIS — F419 Anxiety disorder, unspecified: Secondary | ICD-10-CM | POA: Diagnosis not present

## 2015-03-03 DIAGNOSIS — M5442 Lumbago with sciatica, left side: Secondary | ICD-10-CM | POA: Diagnosis not present

## 2015-03-03 DIAGNOSIS — Z8701 Personal history of pneumonia (recurrent): Secondary | ICD-10-CM | POA: Insufficient documentation

## 2015-03-03 DIAGNOSIS — F329 Major depressive disorder, single episode, unspecified: Secondary | ICD-10-CM | POA: Diagnosis not present

## 2015-03-03 DIAGNOSIS — Y9389 Activity, other specified: Secondary | ICD-10-CM | POA: Diagnosis not present

## 2015-03-03 DIAGNOSIS — Y92009 Unspecified place in unspecified non-institutional (private) residence as the place of occurrence of the external cause: Secondary | ICD-10-CM | POA: Diagnosis not present

## 2015-03-03 DIAGNOSIS — W108XXA Fall (on) (from) other stairs and steps, initial encounter: Secondary | ICD-10-CM | POA: Insufficient documentation

## 2015-03-03 DIAGNOSIS — I10 Essential (primary) hypertension: Secondary | ICD-10-CM | POA: Insufficient documentation

## 2015-03-03 DIAGNOSIS — Y998 Other external cause status: Secondary | ICD-10-CM | POA: Insufficient documentation

## 2015-03-03 DIAGNOSIS — M166 Other bilateral secondary osteoarthritis of hip: Secondary | ICD-10-CM | POA: Insufficient documentation

## 2015-03-03 MED ORDER — METHOCARBAMOL 500 MG PO TABS: 500.0000 mg | ORAL_TABLET | Freq: Two times a day (BID) | ORAL | Status: DC

## 2015-03-03 MED ORDER — DICLOFENAC SODIUM 50 MG PO TBEC
50.0000 mg | DELAYED_RELEASE_TABLET | Freq: Two times a day (BID) | ORAL | Status: DC
Start: 2015-03-03 — End: 2015-03-10

## 2015-03-03 MED ORDER — HYDROMORPHONE HCL 1 MG/ML IJ SOLN
1.0000 mg | Freq: Once | INTRAMUSCULAR | Status: AC
  Administered 2015-03-03: 1 mg via INTRAMUSCULAR
  Filled 2015-03-03: qty 1

## 2015-03-03 MED ORDER — KETOROLAC TROMETHAMINE 30 MG/ML IJ SOLN: 30.0000 mg | Freq: Once | INTRAMUSCULAR | Status: DC

## 2015-03-03 MED ORDER — KETOROLAC TROMETHAMINE 30 MG/ML IJ SOLN
30.0000 mg | Freq: Once | INTRAMUSCULAR | Status: AC
  Administered 2015-03-03: 30 mg via INTRAMUSCULAR
  Filled 2015-03-03: qty 1

## 2015-03-03 MED ORDER — HYDROMORPHONE HCL 1 MG/ML IJ SOLN: 1.0000 mg | Freq: Once | INTRAMUSCULAR | Status: DC

## 2015-03-03 NOTE — ED Notes (Signed)
Pt c/o low back pain, hip and leg pain onset after mechanical fall yesterday, no head injury, no LOC. Pt scheduled for total hip replacement Tuesday for osteoarthritis. Pt denies bowel or bladder incontinence. Ambulatory with limp and cane.

## 2015-03-03 NOTE — Discharge Instructions (Signed)
You were seen in the ER today for evaluation of low back pain after a fall. We held off on x-rays today as your exam suggests muscular/soft tissue injury and I have low suspicion for fracture of your spine. However, as we discussed, please call Dr. Jerl Santos and see him for follow up as soon as possible, or at least as scheduled next week. In the meantime you may continue taking the dilaudid you have at home. I will also give you prescriptions for diclofenac and robaxin. You may take as needed. Return to the ER for new or worsening symptoms.

## 2015-03-03 NOTE — ED Provider Notes (Signed)
CSN: [id number]     Arrival date & time 03/03/15  0831 History   First MD Initiated Contact with Patient 03/03/15 1006     Chief Complaint  Patient presents with  . Back Pain    HPI   [name] is an 45 y.o. female with history of chronic back pain and sciatica, hip osteoarthritis, who presents to the ED for evaluation of low back pain following mechanical fall last night. She states at baseline she has some difficulty ambulating, especially up/down stairs, due to her chronic pain and last night she slipped on one of her steps at home and fell straight down onto her rear end. States she did not slide or tumble down the stairs. She states that since falling she has had severe low back pain and left leg sciatica that is increased from baseline. She denies hitting her head or LOC. She is scheduled for left hip replacement on 2/28; denies increased hip pain. States she is able to ambulate at her baseline capacity. Denies new numbness, weakness, tingling. Denies fever or chills. Denies bowel/bladder incontinence, saddle paresthesias. She states she has been taking her home PO hydromorphone and ibuprofen with no relief.   Past Medical History  Diagnosis Date  . Anxiety   . Depression   . Arthritis   . Osteoarthritis   . Chronic back pain   . Sciatica   . Hypertension   . Pneumonia     12/16   Past Surgical History  Procedure Laterality Date  . Appendectomy  2011  . Lumbar laminectomy/decompression microdiscectomy  01/10/2011    Procedure: LUMBAR LAMINECTOMY/DECOMPRESSION MICRODISCECTOMY;  Surgeon: Javier Docker;  Location: [facility];  Service: Orthopedics;  Laterality: N/A;  Decompression L4 - L5  (X-Ray)  . Lumbar laminectomy/decompression microdiscectomy  08/02/2011    Procedure: LUMBAR LAMINECTOMY/DECOMPRESSION MICRODISCECTOMY;  Surgeon: Javier Docker, MD;  Location: [facility];  Service: Orthopedics;  Laterality: N/A;  Re-do Decompression L4-L5  . Back surgery    . Hernia repair  4/16     umbilical   Family History  Problem Relation Age of Onset  . Heart failure Mother    Social History  Substance Use Topics  . Smoking status: Never Smoker   . Smokeless tobacco: Never Used     Comment: tried smoking 3 days 2012  . Alcohol Use: No   OB History    No data available     Review of Systems  All other systems reviewed and are negative.     Allergies  Acetaminophen; Hydrocodone; and Other  Home Medications   Prior to Admission medications   Medication Sig Start Date End Date Taking? Authorizing Provider  clonazePAM (KLONOPIN) 1 MG tablet Take 1 mg by mouth 2 (two) times daily.    Historical Provider, MD  EVZIO 0.4 MG/0.4ML SOAJ 0.4 mg once as needed (emergency opiod overdose).  11/26/14   Historical Provider, MD  gabapentin (NEURONTIN) 300 MG capsule Take 900 mg by mouth at bedtime.    Historical Provider, MD  HYDROmorphone (DILAUDID) 4 MG tablet Take 4 mg by mouth 2 (two) times daily as needed for severe pain.    Historical Provider, MD  NON FORMULARY Take 4 capsules by mouth daily. super lysine    Historical Provider, MD  Omega 3 1000 MG CAPS Take 2 capsules by mouth daily.    Historical Provider, MD  propranolol (INDERAL) 10 MG tablet Take 10 mg by mouth 2 (two) times daily.    Historical Provider, MD  traZODone (DESYREL) 100 MG tablet Take 250 mg by mouth at bedtime as needed for sleep.     Historical Provider, MD  venlafaxine XR (EFFEXOR-XR) 150 MG 24 hr capsule Take 300 mg by mouth daily. 09/21/14   Historical Provider, MD   BP 125/87 mmHg  Pulse 77  Temp(Src) 98.2 F (36.8 C) (Oral)  Resp 18  SpO2 100%  LMP 02/28/2008 Physical Exam  Constitutional: She is oriented to person, place, and time.  Pt appears very uncomfortable, hunched over chair  HENT:  Head: Atraumatic.  Right Ear: External ear normal.  Left Ear: External ear normal.  Nose: Nose normal.  Eyes: Conjunctivae are normal. No scleral icterus.  Neck: Normal range of motion. Neck supple.   Cardiovascular: Normal rate and regular rhythm.   Pulmonary/Chest: Effort normal. No respiratory distress. She exhibits no tenderness.  Abdominal: Soft. She exhibits no distension.  Musculoskeletal:  Diffuse low back tenderness and spasm. Bilateral paraspinal tenderness. No midline bony tenderness. No stepoffs or deformity noted. No sacral tenderness. Intact strength and sensation in bilat UE and LE.   Neurological: She is alert and oriented to person, place, and time.  Antalgic gait, walks with assistance of cane (which is baseline)  Skin: Skin is warm and dry. There is pallor.  Psychiatric: She has a normal mood and affect. Her behavior is normal.  Nursing note and vitals reviewed.   ED Course  Procedures (including critical care time) Labs Review Labs Reviewed - No data to display  Imaging Review No results found. I have personally reviewed and evaluated these images and lab results as part of my medical decision-making.   EKG Interpretation None      MDM   Final diagnoses:  Bilateral low back pain with left-sided sciatica    Pt is an 45 y.o. female with chronic back pain with sciatica and hip OA, scheduled for hip replacement in four days with Dr. Jerl Santos, who presents for increased back pain from baseline after mechanical fall on her stairs last night. She has diffuse soft tissue tenderness in her low back with spasm. No bony tenderness, no step off deformity. I offered lumbar spine XR today but pt would like to hold off and f/u with Dr. Jerl Santos if her symptoms do not improve. I think this is reasonable. She has been taking PO dilaudid and ibuprofen at home with no relief. Last dose last night. Will give one dose each IM dilaudid and toradol in the ED. Pt declines steroids; states she is allergic to them.  Pt reports significant improvement with pain meds. She has no new focal neuro findings today. She is ambulating at baseline. No red flags for new spinal cord pathology.  Pt is stable for discharge with close outpatient f/u. Instructed to f/u with ortho and PCP. Rx given for robaxin, diclofenac. Pt may take her home hydromorphone as needed as well. ER return precautions given.     Carlene Coria, PA-C 03/03/15 1119  Mancel Bale, MD 03/03/15 [phone]

## 2015-03-04 ENCOUNTER — Telehealth (HOSPITAL_COMMUNITY): Payer: Self-pay

## 2015-03-04 NOTE — Telephone Encounter (Signed)
Pharmacy calling for clarification of Diclofenac Rx.  Call transferred to A. Harris PA for clarification.

## 2015-03-07 ENCOUNTER — Encounter (HOSPITAL_COMMUNITY): Payer: Self-pay | Admitting: Anesthesiology

## 2015-03-07 MED ORDER — CEFAZOLIN SODIUM-DEXTROSE 2-3 GM-% IV SOLR
2.0000 g | INTRAVENOUS | Status: AC
  Administered 2015-03-08: 2 g via INTRAVENOUS
  Filled 2015-03-07: qty 50

## 2015-03-07 MED ORDER — CHLORHEXIDINE GLUCONATE 4 % EX LIQD: 60.0000 mL | Freq: Once | CUTANEOUS | Status: DC

## 2015-03-07 MED ORDER — LACTATED RINGERS IV SOLN
INTRAVENOUS | Status: DC
  Administered 2015-03-08 (×2): via INTRAVENOUS

## 2015-03-07 NOTE — Anesthesia Preprocedure Evaluation (Addendum)
Anesthesia Evaluation  Patient identified by MRN, date of birth, ID band Patient awake    Reviewed: Allergy & Precautions, NPO status , Patient's Chart, lab work & pertinent test results, reviewed documented beta blocker date and time   Airway Mallampati: III  TM Distance: >3 FB     Dental no notable dental hx. (+) Teeth Intact   Pulmonary pneumonia, resolved,    Pulmonary exam normal breath sounds clear to auscultation       Cardiovascular hypertension, Pt. on medications and Pt. on home beta blockers Normal cardiovascular exam Rhythm:Regular Rate:Normal     Neuro/Psych PSYCHIATRIC DISORDERS Anxiety Depression  Neuromuscular disease    GI/Hepatic negative GI ROS, Neg liver ROS,   Endo/Other  Obesity   Renal/GU negative Renal ROS  negative genitourinary   Musculoskeletal  (+) Arthritis , Osteoarthritis,  OA left hip DDD lumbar spine Chronic LBP   Abdominal (+) + obese,   Peds  Hematology negative hematology ROS (+)   Anesthesia Other Findings   Reproductive/Obstetrics negative OB ROS                            Anesthesia Physical Anesthesia Plan  ASA: II  Anesthesia Plan: General   Post-op Pain Management:    Induction: Intravenous  Airway Management Planned: Oral ETT  Additional Equipment:   Intra-op Plan:   Post-operative Plan: Extubation in OR  Informed Consent: I have reviewed the patients History and Physical, chart, labs and discussed the procedure including the risks, benefits and alternatives for the proposed anesthesia with the patient or authorized representative who has indicated his/her understanding and acceptance.   Dental advisory given  Plan Discussed with: CRNA, Anesthesiologist and Surgeon  Anesthesia Plan Comments:        Anesthesia Quick Evaluation

## 2015-03-08 ENCOUNTER — Inpatient Hospital Stay (HOSPITAL_COMMUNITY): Payer: Medicare HMO | Admitting: Emergency Medicine

## 2015-03-08 ENCOUNTER — Inpatient Hospital Stay (HOSPITAL_COMMUNITY): Payer: Medicare HMO

## 2015-03-08 ENCOUNTER — Encounter (HOSPITAL_COMMUNITY)
Admission: RE | Disposition: A | Discharge status: Skilled Nursing Facility | Payer: Self-pay | Source: Ambulatory Visit | Attending: Orthopaedic Surgery

## 2015-03-08 ENCOUNTER — Inpatient Hospital Stay (HOSPITAL_COMMUNITY)
Admission: RE | Admit: 2015-03-08 | Discharge: 2015-03-10 | DRG: 470 | Disposition: A | Discharge status: Skilled Nursing Facility | Payer: Medicare HMO | Source: Ambulatory Visit | Attending: Orthopaedic Surgery | Admitting: Orthopaedic Surgery

## 2015-03-08 ENCOUNTER — Inpatient Hospital Stay (HOSPITAL_COMMUNITY): Payer: Medicare HMO | Admitting: Anesthesiology

## 2015-03-08 ENCOUNTER — Encounter (HOSPITAL_COMMUNITY): Payer: Self-pay

## 2015-03-08 DIAGNOSIS — Z8249 Family history of ischemic heart disease and other diseases of the circulatory system: Secondary | ICD-10-CM

## 2015-03-08 DIAGNOSIS — Z888 Allergy status to other drugs, medicaments and biological substances status: Secondary | ICD-10-CM

## 2015-03-08 DIAGNOSIS — Z23 Encounter for immunization: Secondary | ICD-10-CM | POA: Diagnosis not present

## 2015-03-08 DIAGNOSIS — Z885 Allergy status to narcotic agent status: Secondary | ICD-10-CM

## 2015-03-08 DIAGNOSIS — G8929 Other chronic pain: Secondary | ICD-10-CM | POA: Diagnosis present

## 2015-03-08 DIAGNOSIS — M25552 Pain in left hip: Secondary | ICD-10-CM | POA: Diagnosis present

## 2015-03-08 DIAGNOSIS — M1612 Unilateral primary osteoarthritis, left hip: Secondary | ICD-10-CM | POA: Diagnosis present

## 2015-03-08 DIAGNOSIS — Z419 Encounter for procedure for purposes other than remedying health state, unspecified: Secondary | ICD-10-CM

## 2015-03-08 DIAGNOSIS — I10 Essential (primary) hypertension: Secondary | ICD-10-CM | POA: Diagnosis present

## 2015-03-08 DIAGNOSIS — M545 Low back pain: Secondary | ICD-10-CM | POA: Diagnosis present

## 2015-03-08 DIAGNOSIS — Z886 Allergy status to analgesic agent status: Secondary | ICD-10-CM | POA: Diagnosis not present

## 2015-03-08 HISTORY — PX: TOTAL HIP ARTHROPLASTY: SHX124

## 2015-03-08 HISTORY — DX: Other chronic pain: G89.29

## 2015-03-08 HISTORY — DX: Low back pain: M54.5

## 2015-03-08 SURGERY — ARTHROPLASTY, HIP, TOTAL, ANTERIOR APPROACH
Anesthesia: General | Laterality: Left

## 2015-03-08 MED ORDER — BUPIVACAINE-EPINEPHRINE (PF) 0.5% -1:200000 IJ SOLN
INTRAMUSCULAR | Status: DC | PRN
  Administered 2015-03-08: 20 mL

## 2015-03-08 MED ORDER — HYDROMORPHONE HCL 1 MG/ML IJ SOLN
INTRAMUSCULAR | Status: AC
  Filled 2015-03-08: qty 1

## 2015-03-08 MED ORDER — METOCLOPRAMIDE HCL 5 MG PO TABS: 5.0000 mg | ORAL_TABLET | Freq: Three times a day (TID) | ORAL | Status: DC | PRN

## 2015-03-08 MED ORDER — TRAZODONE HCL 100 MG PO TABS
250.0000 mg | ORAL_TABLET | Freq: Every evening | ORAL | Status: DC | PRN
  Administered 2015-03-08 – 2015-03-09 (×2): 250 mg via ORAL
  Filled 2015-03-08 (×2): qty 2

## 2015-03-08 MED ORDER — HYDROMORPHONE HCL 2 MG PO TABS
2.0000 mg | ORAL_TABLET | ORAL | Status: DC | PRN
  Administered 2015-03-08 – 2015-03-10 (×12): 4 mg via ORAL
  Filled 2015-03-08 (×12): qty 2

## 2015-03-08 MED ORDER — LIDOCAINE HCL (CARDIAC) 20 MG/ML IV SOLN
INTRAVENOUS | Status: AC
  Filled 2015-03-08: qty 5

## 2015-03-08 MED ORDER — ONDANSETRON HCL 4 MG PO TABS: 4.0000 mg | ORAL_TABLET | Freq: Four times a day (QID) | ORAL | Status: DC | PRN

## 2015-03-08 MED ORDER — FENTANYL CITRATE (PF) 100 MCG/2ML IJ SOLN
INTRAMUSCULAR | Status: AC
  Filled 2015-03-08: qty 2

## 2015-03-08 MED ORDER — BUPIVACAINE-EPINEPHRINE (PF) 0.5% -1:200000 IJ SOLN
INTRAMUSCULAR | Status: AC
  Filled 2015-03-08: qty 30

## 2015-03-08 MED ORDER — HYDROMORPHONE HCL 1 MG/ML IJ SOLN
0.2500 mg | INTRAMUSCULAR | Status: DC | PRN
  Administered 2015-03-08 (×4): 0.5 mg via INTRAVENOUS

## 2015-03-08 MED ORDER — BUPIVACAINE LIPOSOME 1.3 % IJ SUSP
INTRAMUSCULAR | Status: DC | PRN
  Administered 2015-03-08: 20 mL

## 2015-03-08 MED ORDER — NEOSTIGMINE METHYLSULFATE 10 MG/10ML IV SOLN
INTRAVENOUS | Status: DC | PRN
  Administered 2015-03-08: 5 mg via INTRAVENOUS

## 2015-03-08 MED ORDER — FENTANYL CITRATE (PF) 250 MCG/5ML IJ SOLN
INTRAMUSCULAR | Status: AC
  Filled 2015-03-08: qty 5

## 2015-03-08 MED ORDER — INFLUENZA VAC SPLIT QUAD 0.5 ML IM SUSY
0.5000 mL | PREFILLED_SYRINGE | INTRAMUSCULAR | Status: AC
  Administered 2015-03-09: 0.5 mL via INTRAMUSCULAR
  Filled 2015-03-08: qty 0.5

## 2015-03-08 MED ORDER — EPHEDRINE SULFATE 50 MG/ML IJ SOLN
INTRAMUSCULAR | Status: AC
  Filled 2015-03-08: qty 1

## 2015-03-08 MED ORDER — ACETAMINOPHEN 650 MG RE SUPP: 650.0000 mg | Freq: Four times a day (QID) | RECTAL | Status: DC | PRN

## 2015-03-08 MED ORDER — GABAPENTIN 300 MG PO CAPS
900.0000 mg | ORAL_CAPSULE | Freq: Every day | ORAL | Status: DC
  Administered 2015-03-08 – 2015-03-09 (×2): 900 mg via ORAL
  Filled 2015-03-08 (×2): qty 3

## 2015-03-08 MED ORDER — DOCUSATE SODIUM 100 MG PO CAPS
100.0000 mg | ORAL_CAPSULE | Freq: Two times a day (BID) | ORAL | Status: DC
  Administered 2015-03-08 – 2015-03-10 (×5): 100 mg via ORAL
  Filled 2015-03-08 (×5): qty 1

## 2015-03-08 MED ORDER — ONDANSETRON HCL 4 MG/2ML IJ SOLN: 4.0000 mg | Freq: Four times a day (QID) | INTRAMUSCULAR | Status: DC | PRN

## 2015-03-08 MED ORDER — FENTANYL CITRATE (PF) 100 MCG/2ML IJ SOLN
25.0000 ug | INTRAMUSCULAR | Status: DC | PRN
  Administered 2015-03-08 (×4): 25 ug via INTRAVENOUS

## 2015-03-08 MED ORDER — CEFAZOLIN SODIUM-DEXTROSE 2-3 GM-% IV SOLR
2.0000 g | Freq: Four times a day (QID) | INTRAVENOUS | Status: AC
  Administered 2015-03-08 (×2): 2 g via INTRAVENOUS
  Filled 2015-03-08 (×2): qty 50

## 2015-03-08 MED ORDER — ROCURONIUM BROMIDE 50 MG/5ML IV SOLN
INTRAVENOUS | Status: AC
  Filled 2015-03-08: qty 1

## 2015-03-08 MED ORDER — DIPHENHYDRAMINE HCL 50 MG/ML IJ SOLN
INTRAMUSCULAR | Status: AC
  Filled 2015-03-08: qty 1

## 2015-03-08 MED ORDER — DEXAMETHASONE SODIUM PHOSPHATE 10 MG/ML IJ SOLN
INTRAMUSCULAR | Status: DC | PRN
  Administered 2015-03-08: 10 mg via INTRAVENOUS

## 2015-03-08 MED ORDER — PROPRANOLOL HCL 10 MG PO TABS
10.0000 mg | ORAL_TABLET | Freq: Two times a day (BID) | ORAL | Status: DC
  Administered 2015-03-09 – 2015-03-10 (×2): 10 mg via ORAL
  Filled 2015-03-08 (×5): qty 1

## 2015-03-08 MED ORDER — METOCLOPRAMIDE HCL 5 MG/ML IJ SOLN: 5.0000 mg | Freq: Three times a day (TID) | INTRAMUSCULAR | Status: DC | PRN

## 2015-03-08 MED ORDER — PHENOL 1.4 % MT LIQD: 1.0000 | OROMUCOSAL | Status: DC | PRN

## 2015-03-08 MED ORDER — DIPHENHYDRAMINE HCL 12.5 MG/5ML PO ELIX: 12.5000 mg | ORAL_SOLUTION | ORAL | Status: DC | PRN

## 2015-03-08 MED ORDER — LIDOCAINE HCL (CARDIAC) 20 MG/ML IV SOLN
INTRAVENOUS | Status: DC | PRN
  Administered 2015-03-08: 50 mg via INTRAVENOUS

## 2015-03-08 MED ORDER — ASPIRIN EC 325 MG PO TBEC
325.0000 mg | DELAYED_RELEASE_TABLET | Freq: Two times a day (BID) | ORAL | Status: DC
  Administered 2015-03-09 – 2015-03-10 (×3): 325 mg via ORAL
  Filled 2015-03-08 (×3): qty 1

## 2015-03-08 MED ORDER — PROPOFOL 10 MG/ML IV BOLUS
INTRAVENOUS | Status: AC
  Filled 2015-03-08: qty 20

## 2015-03-08 MED ORDER — VENLAFAXINE HCL ER 150 MG PO CP24
300.0000 mg | ORAL_CAPSULE | Freq: Every day | ORAL | Status: DC
  Administered 2015-03-09 – 2015-03-10 (×2): 300 mg via ORAL
  Filled 2015-03-08 (×3): qty 2

## 2015-03-08 MED ORDER — GLYCOPYRROLATE 0.2 MG/ML IJ SOLN
INTRAMUSCULAR | Status: AC
  Filled 2015-03-08: qty 4

## 2015-03-08 MED ORDER — MENTHOL 3 MG MT LOZG: 1.0000 | LOZENGE | OROMUCOSAL | Status: DC | PRN

## 2015-03-08 MED ORDER — METHOCARBAMOL 500 MG PO TABS
500.0000 mg | ORAL_TABLET | Freq: Four times a day (QID) | ORAL | Status: DC | PRN
  Administered 2015-03-08 – 2015-03-10 (×8): 500 mg via ORAL
  Filled 2015-03-08 (×8): qty 1

## 2015-03-08 MED ORDER — BUPIVACAINE LIPOSOME 1.3 % IJ SUSP
20.0000 mL | INTRAMUSCULAR | Status: DC
  Filled 2015-03-08: qty 20

## 2015-03-08 MED ORDER — ACETAMINOPHEN 325 MG PO TABS: 650.0000 mg | ORAL_TABLET | Freq: Four times a day (QID) | ORAL | Status: DC | PRN

## 2015-03-08 MED ORDER — GLYCOPYRROLATE 0.2 MG/ML IJ SOLN
INTRAMUSCULAR | Status: DC | PRN
  Administered 2015-03-08: .8 mg via INTRAVENOUS

## 2015-03-08 MED ORDER — ALUM & MAG HYDROXIDE-SIMETH 200-200-20 MG/5ML PO SUSP: 30.0000 mL | ORAL | Status: DC | PRN

## 2015-03-08 MED ORDER — SODIUM CHLORIDE 0.9 % IJ SOLN
INTRAMUSCULAR | Status: AC
  Filled 2015-03-08: qty 10

## 2015-03-08 MED ORDER — MIDAZOLAM HCL 2 MG/2ML IJ SOLN
INTRAMUSCULAR | Status: AC
  Filled 2015-03-08: qty 2

## 2015-03-08 MED ORDER — ONDANSETRON HCL 4 MG/2ML IJ SOLN
INTRAMUSCULAR | Status: DC | PRN
  Administered 2015-03-08: 4 mg via INTRAVENOUS

## 2015-03-08 MED ORDER — MEPERIDINE HCL 25 MG/ML IJ SOLN: 6.2500 mg | INTRAMUSCULAR | Status: DC | PRN

## 2015-03-08 MED ORDER — ROCURONIUM BROMIDE 100 MG/10ML IV SOLN
INTRAVENOUS | Status: DC | PRN
  Administered 2015-03-08: 15 mg via INTRAVENOUS
  Administered 2015-03-08: 10 mg via INTRAVENOUS
  Administered 2015-03-08: 40 mg via INTRAVENOUS

## 2015-03-08 MED ORDER — HYDROMORPHONE HCL 1 MG/ML IJ SOLN
INTRAMUSCULAR | Status: AC
Start: 2015-03-08 — End: 2015-03-08
  Filled 2015-03-08: qty 1

## 2015-03-08 MED ORDER — FENTANYL CITRATE (PF) 250 MCG/5ML IJ SOLN
INTRAMUSCULAR | Status: DC | PRN
  Administered 2015-03-08: 50 ug via INTRAVENOUS
  Administered 2015-03-08: 100 ug via INTRAVENOUS
  Administered 2015-03-08 (×3): 50 ug via INTRAVENOUS
  Administered 2015-03-08: 100 ug via INTRAVENOUS
  Administered 2015-03-08 (×2): 50 ug via INTRAVENOUS

## 2015-03-08 MED ORDER — NEOSTIGMINE METHYLSULFATE 10 MG/10ML IV SOLN
INTRAVENOUS | Status: AC
  Filled 2015-03-08: qty 1

## 2015-03-08 MED ORDER — BISACODYL 5 MG PO TBEC: 5.0000 mg | DELAYED_RELEASE_TABLET | Freq: Every day | ORAL | Status: DC | PRN

## 2015-03-08 MED ORDER — DIPHENHYDRAMINE HCL 50 MG/ML IJ SOLN
INTRAMUSCULAR | Status: DC | PRN
  Administered 2015-03-08: 12.5 mg via INTRAVENOUS

## 2015-03-08 MED ORDER — CLONAZEPAM 1 MG PO TABS
1.0000 mg | ORAL_TABLET | Freq: Two times a day (BID) | ORAL | Status: DC
  Administered 2015-03-08 – 2015-03-10 (×4): 1 mg via ORAL
  Filled 2015-03-08 (×5): qty 1

## 2015-03-08 MED ORDER — HYDROMORPHONE HCL 1 MG/ML IJ SOLN
0.5000 mg | INTRAMUSCULAR | Status: DC | PRN
  Administered 2015-03-08 – 2015-03-10 (×9): 1 mg via INTRAVENOUS
  Administered 2015-03-10 (×2): 0.5 mg via INTRAVENOUS
  Filled 2015-03-08 (×11): qty 1

## 2015-03-08 MED ORDER — METOCLOPRAMIDE HCL 5 MG/ML IJ SOLN: 10.0000 mg | Freq: Once | INTRAMUSCULAR | Status: DC | PRN

## 2015-03-08 MED ORDER — 0.9 % SODIUM CHLORIDE (POUR BTL) OPTIME
TOPICAL | Status: DC | PRN
  Administered 2015-03-08: 1000 mL

## 2015-03-08 MED ORDER — PROPOFOL 10 MG/ML IV BOLUS
INTRAVENOUS | Status: DC | PRN
  Administered 2015-03-08: 180 mg via INTRAVENOUS

## 2015-03-08 MED ORDER — MIDAZOLAM HCL 5 MG/5ML IJ SOLN
INTRAMUSCULAR | Status: DC | PRN
  Administered 2015-03-08: 2 mg via INTRAVENOUS

## 2015-03-08 MED ORDER — SODIUM CHLORIDE 0.9 % IV SOLN
1000.0000 mg | INTRAVENOUS | Status: AC
  Administered 2015-03-08: 1000 mg via INTRAVENOUS
  Filled 2015-03-08: qty 10

## 2015-03-08 MED ORDER — SUCCINYLCHOLINE CHLORIDE 20 MG/ML IJ SOLN
INTRAMUSCULAR | Status: AC
  Filled 2015-03-08: qty 1

## 2015-03-08 MED ORDER — LACTATED RINGERS IV SOLN
INTRAVENOUS | Status: DC
  Administered 2015-03-08 (×2): via INTRAVENOUS

## 2015-03-08 MED ORDER — METHOCARBAMOL 1000 MG/10ML IJ SOLN
500.0000 mg | Freq: Four times a day (QID) | INTRAVENOUS | Status: DC | PRN
  Filled 2015-03-08: qty 5

## 2015-03-08 SURGICAL SUPPLY — 58 items
BLADE SAW SGTL 18X1.27X75 (BLADE) ×2 IMPLANT
BLADE SURG ROTATE 9660 (MISCELLANEOUS) IMPLANT
CAPT HIP TOTAL 2 ×1 IMPLANT
CELLS DAT CNTRL 66122 CELL SVR (MISCELLANEOUS) ×1 IMPLANT
COVER PERINEAL POST (MISCELLANEOUS) ×2 IMPLANT
COVER SURGICAL LIGHT HANDLE (MISCELLANEOUS) ×2 IMPLANT
DRAPE C-ARM 42X72 X-RAY (DRAPES) ×2 IMPLANT
DRAPE IMP U-DRAPE 54X76 (DRAPES) ×2 IMPLANT
DRAPE STERI IOBAN 125X83 (DRAPES) ×2 IMPLANT
DRAPE U-SHAPE 47X51 STRL (DRAPES) ×6 IMPLANT
DRSG AQUACEL AG ADV 3.5X10 (GAUZE/BANDAGES/DRESSINGS) ×2 IMPLANT
DURAPREP 26ML APPLICATOR (WOUND CARE) ×2 IMPLANT
ELECT BLADE 4.0 EZ CLEAN MEGAD (MISCELLANEOUS)
ELECT CAUTERY BLADE 6.4 (BLADE) ×2 IMPLANT
ELECT REM PT RETURN 9FT ADLT (ELECTROSURGICAL) ×2
ELECTRODE BLDE 4.0 EZ CLN MEGD (MISCELLANEOUS) IMPLANT
ELECTRODE REM PT RTRN 9FT ADLT (ELECTROSURGICAL) ×1 IMPLANT
FACESHIELD WRAPAROUND (MASK) ×4 IMPLANT
FACESHIELD WRAPAROUND OR TEAM (MASK) ×2 IMPLANT
GLOVE BIO SURGEON STRL SZ8 (GLOVE) ×5 IMPLANT
GLOVE BIOGEL PI IND STRL 7.0 (GLOVE) IMPLANT
GLOVE BIOGEL PI IND STRL 7.5 (GLOVE) IMPLANT
GLOVE BIOGEL PI IND STRL 8 (GLOVE) ×2 IMPLANT
GLOVE BIOGEL PI INDICATOR 7.0 (GLOVE) ×1
GLOVE BIOGEL PI INDICATOR 7.5 (GLOVE) ×1
GLOVE BIOGEL PI INDICATOR 8 (GLOVE) ×2
GLOVE SURG SS PI 7.0 STRL IVOR (GLOVE) ×2 IMPLANT
GLOVE SURG SS PI 7.5 STRL IVOR (GLOVE) ×1 IMPLANT
GOWN STRL REUS W/ TWL LRG LVL3 (GOWN DISPOSABLE) ×1 IMPLANT
GOWN STRL REUS W/ TWL XL LVL3 (GOWN DISPOSABLE) ×2 IMPLANT
GOWN STRL REUS W/TWL LRG LVL3 (GOWN DISPOSABLE) ×2
GOWN STRL REUS W/TWL XL LVL3 (GOWN DISPOSABLE) ×4
KIT BASIN OR (CUSTOM PROCEDURE TRAY) ×2 IMPLANT
KIT ROOM TURNOVER OR (KITS) ×2 IMPLANT
LINER BOOT UNIVERSAL DISP (MISCELLANEOUS) ×2 IMPLANT
MANIFOLD NEPTUNE II (INSTRUMENTS) ×2 IMPLANT
NDL SPNL 18GX3.5 QUINCKE PK (NEEDLE) ×1 IMPLANT
NEEDLE SPNL 18GX3.5 QUINCKE PK (NEEDLE) ×2 IMPLANT
NS IRRIG 1000ML POUR BTL (IV SOLUTION) ×2 IMPLANT
PACK TOTAL JOINT (CUSTOM PROCEDURE TRAY) ×2 IMPLANT
PAD ARMBOARD 7.5X6 YLW CONV (MISCELLANEOUS) ×4 IMPLANT
RETRACTOR WND ALEXIS 18 MED (MISCELLANEOUS) ×1 IMPLANT
RTRCTR WOUND ALEXIS 18CM MED (MISCELLANEOUS) ×2
STAPLER VISISTAT 35W (STAPLE) ×2 IMPLANT
STRIP CLOSURE SKIN 1/2X4 (GAUZE/BANDAGES/DRESSINGS) ×2 IMPLANT
SUT ETHIBOND NAB CT1 #1 30IN (SUTURE) ×6 IMPLANT
SUT VIC AB 0 CT1 27 (SUTURE)
SUT VIC AB 0 CT1 27XBRD ANBCTR (SUTURE) IMPLANT
SUT VIC AB 1 CT1 27 (SUTURE) ×2
SUT VIC AB 1 CT1 27XBRD ANBCTR (SUTURE) ×1 IMPLANT
SUT VIC AB 2-0 CT1 27 (SUTURE) ×2
SUT VIC AB 2-0 CT1 TAPERPNT 27 (SUTURE) ×1 IMPLANT
SUT VLOC 180 0 24IN GS25 (SUTURE) ×2 IMPLANT
SYR 50ML LL SCALE MARK (SYRINGE) ×2 IMPLANT
TOWEL OR 17X24 6PK STRL BLUE (TOWEL DISPOSABLE) ×2 IMPLANT
TOWEL OR 17X26 10 PK STRL BLUE (TOWEL DISPOSABLE) ×4 IMPLANT
TRAY FOLEY CATH 14FR (SET/KITS/TRAYS/PACK) IMPLANT
WATER STERILE IRR 1000ML POUR (IV SOLUTION) ×2 IMPLANT

## 2015-03-08 NOTE — Progress Notes (Signed)
PT Cancellation Note  Patient Details Name: Judith Hardy MRN: [id number] DOB: 11-Oct-1970   Cancelled Treatment:    Reason Eval/Treat Not Completed: Pain limiting ability to participate (pt with uncontrolled pain despite meds and declines eval at this time)   Delorse Lek 03/08/2015, 12:38 PM Delaney Meigs, PT [phone]

## 2015-03-08 NOTE — Anesthesia Postprocedure Evaluation (Signed)
Anesthesia Post Note  Patient: Judith Hardy  Procedure(s) Performed: Procedure(s) (LRB): TOTAL HIP ARTHROPLASTY ANTERIOR APPROACH (Left)  Patient location during evaluation: PACU Anesthesia Type: General Level of consciousness: awake and alert and oriented Pain management: pain level controlled Vital Signs Assessment: post-procedure vital signs reviewed and stable Respiratory status: spontaneous breathing, nonlabored ventilation, respiratory function stable and patient connected to nasal cannula oxygen Cardiovascular status: blood pressure returned to baseline and stable Postop Assessment: no signs of nausea or vomiting Anesthetic complications: no    Last Vitals:  Filed Vitals:   03/08/15 1115 03/08/15 1116  BP: 112/94   Pulse: 56   Temp:  36.4 C  Resp: 11     Last Pain:  Filed Vitals:   03/08/15 1117  PainSc: 9                  [name] A.

## 2015-03-08 NOTE — Transfer of Care (Signed)
Immediate Anesthesia Transfer of Care Note  Patient: Judith Hardy  Procedure(s) Performed: Procedure(s): TOTAL HIP ARTHROPLASTY ANTERIOR APPROACH (Left)  Patient Location: PACU  Anesthesia Type:General  Level of Consciousness: awake  Airway & Oxygen Therapy: Patient Spontanous Breathing and Patient connected to nasal cannula oxygen  Post-op Assessment: Report given to RN and Post -op Vital signs reviewed and stable  Post vital signs: stable  Last Vitals:  Filed Vitals:   03/08/15 0553  BP: 127/72  Pulse: 71  Temp: 36.4 C    Complications: No apparent anesthesia complications

## 2015-03-08 NOTE — Progress Notes (Signed)
Utilization review completed.  

## 2015-03-08 NOTE — Op Note (Signed)
PRE-OP DIAGNOSIS:  LEFT HIP DEGENERATIVE JOINT DISEASE POST-OP DIAGNOSIS: same PROCEDURE:  LEFT TOTAL HIP ARTHROPLASTY ANTERIOR APPROACH ANESTHESIA:  General SURGEON:  Marcene Corning MD ASSISTANT:  [name] PA-C   INDICATIONS FOR PROCEDURE:  The patient is a 45 y.o. female with a long history of a painful hip.  This has persisted despite multiple conservative measures.  The patient has persisted with pain and dysfunction making rest and activity difficult.  A total hip replacement is offered as surgical treatment.  Informed operative consent was obtained after discussion of possible complications including reaction to anesthesia, infection, neurovascular injury, dislocation, DVT, PE, and death.  The importance of the postoperative rehab program to optimize result was stressed with the patient.  SUMMARY OF FINDINGS AND PROCEDURE:  Under general anesthesia through a anterior approach an the Hana table a left THR was performed.  The patient had severe degenerative change and excellent bone quality.  We used DePuy components to replace the hip and these were size KA 9 Corail femur capped with a +1 32mm ceramic hip ball.  On the acetabular side we used a size 48 Gription shell with a plus 4 neutral polyethylene liner.  We did use a hole eliminator.  [name] PA-C assisted throughout and was invaluable to the completion of the case in that he helped position and retract while I performed the procedure.  He also closed simultaneously to help minimize OR time.  I used fluoroscopy throughout the case to check position of components and leg lengths and read all these views myself.  DESCRIPTION OF PROCEDURE:  The patient was taken to the OR suite where general anesthetic was applied.  The patient was then positioned on the Hana table supine.  All bony prominences were appropriately padded.  Prep and drape was then performed in normal sterile fashion.  The patient was given kefzol preoperative antibiotic and  an appropriate time out was performed.  We then took an anterior approach to the left hip.  Dissection was taken through adipose to the tensor fascia lata fascia.  This structure was incised longitudinally and we dissected in the intermuscular interval just medial to this muscle.  Cobra retractors were placed superior and inferior to the femoral neck superficial to the capsule.  A capsular incision was then made and the retractors were placed along the femoral neck.  Xray was brought in to get a good level for the femoral neck cut which was made with an oscillating saw and osteotome.  The femoral head was removed with a corkscrew.  The acetabulum was exposed and some labral tissues were excised. Reaming was taken to the inside wall of the pelvis and sequentially up to 1 mm smaller than the actual component.  A trial of components was done and then the aforementioned acetabular shell was placed in appropriate tilt and anteversion confirmed by fluoroscopy. The liner was placed along with the hole eliminator and attention was turned to the femur.  The leg was brought down and over into adduction and the elevator bar was used to raise the femur up gently in the wound.  The piriformis was released with care taken to preserve the obturator internus attachment and all of the posterior capsule. The femur was reamed and then broached to the appropriate size.  A trial reduction was done and the aforementioned head and neck assembly gave [place] the best stability in extension with external rotation.  Leg lengths were felt to be about equal by fluoroscopic exam.  The  trial components were removed and the wound irrigated.  We then placed the femoral component in appropriate anteversion.  The head was applied to a dry stem neck and the hip again reduced.  It was again stable in the aforementioned position.  The would was irrigated again followed by re-approximation of anterior capsule with ethibond suture. Tensor fascia was repaired  with V-loc suture  followed by subcutaneous closure with #O and #2 undyed vicryl.  Skin was closed with subQ stitch and steristrips followed by a sterile dressing.  EBL and IOF can be obtained from anesthesia records.  DISPOSITION:  The patient was extubated in the OR and taken to PACU in stable condition to be admitted to the Orthopedic Surgery for appropriate post-op care to include perioperative antibiotics and DVT prophylaxis.

## 2015-03-08 NOTE — Interval H&P Note (Signed)
OK for surgery PD 

## 2015-03-08 NOTE — Anesthesia Procedure Notes (Signed)
Procedure Name: Intubation Date/Time: 03/08/2015 7:47 AM Performed by: Jerilee Hoh Pre-anesthesia Checklist: Patient identified, Emergency Drugs available, Suction available, Patient being monitored and Timeout performed Patient Re-evaluated:Patient Re-evaluated prior to inductionOxygen Delivery Method: Circle system utilized Preoxygenation: Pre-oxygenation with 100% oxygen Intubation Type: IV induction Ventilation: Mask ventilation without difficulty Laryngoscope Size: Mac and 3 Grade View: Grade I Tube type: Oral Tube size: 7.0 mm Number of attempts: 1 Airway Equipment and Method: Stylet Placement Confirmation: ETT inserted through vocal cords under direct vision,  positive ETCO2 and breath sounds checked- equal and bilateral Secured at: 21 cm Tube secured with: Tape Dental Injury: Teeth and Oropharynx as per pre-operative assessment

## 2015-03-09 ENCOUNTER — Encounter (HOSPITAL_COMMUNITY): Payer: Self-pay | Admitting: Orthopaedic Surgery

## 2015-03-09 DIAGNOSIS — M1612 Unilateral primary osteoarthritis, left hip: Secondary | ICD-10-CM | POA: Diagnosis not present

## 2015-03-09 NOTE — Evaluation (Signed)
Physical Therapy Evaluation Patient Details Name: Judith Hardy MRN: [id number] DOB: Jan 15, 1970 Today's Date: 03/09/2015   History of Present Illness  45 y.o. female with history of chronic back pain and sciatica, hip osteoarthritis, who presents  s/p ;eft THA anterior approach.  Clinical Impression  Patient demonstrates deficits in functional mobility as indicated below. Will need continued skilled PT to address deficits and maximize function. Patient with history of admissions for falls. During ambulation patient initially steady with heavy reliance on RW and cues for sequencing, once reaching approximately 60 ft patient began to buckle in bilateral LEs requiring maximal assist via gait belt with this therapist using knee under patient buttocks to for chair and prevent fall to ground. Patient eyes open and responsive intermittently. Continually asking "is this bad, do I get a bad report" BP assessed vitals stable with HR 80s and BP 116/70. At this time recommend ST SNF upon acute discharge.      Follow Up Recommendations SNF;Supervision for mobility/OOB    Equipment Recommendations  None recommended by PT    Recommendations for Other Services       Precautions / Restrictions Precautions Precautions: Fall Restrictions Weight Bearing Restrictions: Yes LLE Weight Bearing: Weight bearing as tolerated      Mobility  Bed Mobility Overal bed mobility: Needs Assistance Bed Mobility: Supine to Sit     Supine to sit: Min assist     General bed mobility comments: min assist to move LLE to EOB and rotate hips  Transfers Overall transfer level: Needs assistance Equipment used: Rolling walker (2 wheeled) Transfers: Sit to/from Stand Sit to Stand: Min guard         General transfer comment: Min guard for stability to come to standing initially, then minimal assist to elevate to standing from chair in hall  Ambulation/Gait Ambulation/Gait assistance: Min guard;Max  assist Ambulation Distance (Feet): 60 Feet Assistive device: Rolling walker (2 wheeled) Gait Pattern/deviations: Step-through pattern;Decreased stride length;Antalgic Gait velocity: decreased   General Gait Details: during ambulation patient initially steady with heavy reliance on RW and cues for sequencing, once reaching approximately 60 ft patient began to buckle in bilateral LEs requiring maximal assist via gait belt with this therapist using knee under patient buttocks to for chair and prevent fall to ground. Patient eyes open and responsive intermittently. Continually asking "is this bad, do I get a bad report" BP assessed vitals stable with HR 80s and BP 116/70.  Stairs            Wheelchair Mobility    Modified Rankin (Stroke Patients Only)       Balance Overall balance assessment: Needs assistance   Sitting balance-Leahy Scale: Good       Standing balance-Leahy Scale: Fair                               Pertinent Vitals/Pain      Home Living Family/patient expects to be discharged to:: Skilled nursing facility ([facility]) Living Arrangements: Spouse/significant other;Children Available Help at Discharge: Family Type of Home: House Home Access: Stairs to enter Entrance Stairs-Rails: Doctor, general practice of Steps: 7 Home Layout: Two level Home Equipment: Environmental consultant - 2 wheels;Bedside commode;Cane - single point      Prior Function Level of Independence: Independent with assistive device(s)               Hand Dominance   Dominant Hand: Right    Extremity/Trunk Assessment  Upper Extremity Assessment: Overall WFL for tasks assessed           Lower Extremity Assessment: LLE deficits/detail         Communication   Communication: No difficulties  Cognition Arousal/Alertness: Awake/alert Behavior During Therapy: WFL for tasks assessed/performed Overall Cognitive Status: Within Functional Limits for tasks assessed                       General Comments      Exercises        Assessment/Plan    PT Assessment Patient needs continued PT services  PT Diagnosis Difficulty walking;Abnormality of gait;Acute pain   PT Problem List Decreased strength;Decreased range of motion;Decreased activity tolerance;Decreased balance;Decreased mobility;Decreased coordination;Decreased safety awareness;Pain  PT Treatment Interventions DME instruction;Gait training;Stair training;Functional mobility training;Therapeutic activities;Therapeutic exercise;Balance training;Patient/family education   PT Goals (Current goals can be found in the Care Plan section) Acute Rehab PT Goals Patient Stated Goal: to go to [facility] PT Goal Formulation: With patient Time For Goal Achievement: 03/23/15 Potential to Achieve Goals: Good    Frequency 7X/week   Barriers to discharge Inaccessible home environment;Decreased caregiver support states that her family is not able to help her, that they are not with her all the time and that she needs to be completely independent (stairs to enter home as well as bedroom)    Co-evaluation               End of Session Equipment Utilized During Treatment: Gait belt Activity Tolerance: Patient limited by pain;Treatment limited secondary to medical complications (Comment) (questionable fall vs near syncope vs other) Patient left: in chair;with call bell/phone within reach;with chair alarm set Nurse Communication: Mobility status         Time: 1610-9604 PT Time Calculation (min) (ACUTE ONLY): 26 min   Charges:   PT Evaluation $PT Eval Moderate Complexity: 1 Procedure PT Treatments $Gait Training: 8-22 mins   PT G CodesFabio Asa 03-14-2015, 10:08 AM  Charlotte Crumb, PT DPT  [phone]

## 2015-03-09 NOTE — Clinical Social Work Note (Signed)
Clinical Social Work Assessment  Patient Details  Name: Judith Hardy MRN: [id number] Date of Birth: [birth year]  Date of referral:  03/09/15               Reason for consult:  Facility Placement                Permission sought to share information with:    Permission granted to share information::  Yes, Verbal Permission Granted  Name::        Agency::   [facility] is first choice)  Relationship::     Contact Information:     Housing/Transportation Living arrangements for the past 2 months:  Single Family Home Source of Information:  Patient Patient Interpreter Needed:  None Criminal Activity/Legal Involvement Pertinent to Current Situation/Hospitalization:  No - Comment as needed Significant Relationships:    Lives with:  Spouse Do you feel safe going back to the place where you live?  No Need for family participation in patient care:  No (Coment)  Care giving concerns:  No caregiver present at time of discharge.   Social Worker assessment / plan:  CSW received consult for SNF placement.  Patient is from home with spouse and feels that SNF is a realistic discharge after hip surgery.  Patient is requesting [employer].  Referral has been initiated.  Employment status:  Disabled (Comment on whether or not currently receiving Disability) Insurance information:  Teacher, English as a foreign language PT Recommendations:  Skilled Nursing Facility Information / Referral to community resources:  Skilled Nursing Facility  Patient/Family's Response to care:  Agreeable to SNF.  Patient/Family's Understanding of and Emotional Response to Diagnosis, Current Treatment, and Prognosis:  Patient is realistic regarding level of care needed at time of discharge.  Emotional Assessment Appearance:  Appears older than stated age Attitude/Demeanor/Rapport:    Affect (typically observed):  Accepting, Adaptable Orientation:  Oriented to Self, Oriented to Place, Oriented to  Time, Oriented to  Situation Alcohol / Substance use:  Never Used Psych involvement (Current and /or in the community):  No (Comment)  Discharge Needs  Concerns to be addressed:  No discharge needs identified Readmission within the last 30 days:  No Current discharge risk:  None Barriers to Discharge:  English as a second language teacher, Continued Medical Work up   [employer], LCSW 03/09/2015, 12:08 PM

## 2015-03-09 NOTE — Progress Notes (Signed)
Physical Therapy Treatment Patient Details Name: Judith Hardy MRN: [id number] DOB: 07/08/1970 Today's Date: 03/09/2015    History of Present Illness 45 y.o. female with history of chronic back pain and sciatica, hip osteoarthritis, who presents  s/p ;eft THA anterior approach.    PT Comments    Patient seen for progression of mobility, continues to required physical assist for bed mobility and transfers. Increased assist with chair follow for short distance ambulation with continued episodes of bilateral LE buckling. Patient remains high fall risk. Continue to recommend ST SNF.   Follow Up Recommendations  SNF;Supervision for mobility/OOB     Equipment Recommendations  None recommended by PT    Recommendations for Other Services       Precautions / Restrictions Precautions Precautions: Fall Restrictions Weight Bearing Restrictions: Yes LLE Weight Bearing: Weight bearing as tolerated    Mobility  Bed Mobility Overal bed mobility: Needs Assistance Bed Mobility: Supine to Sit     Supine to sit: Min assist Sit to supine: Min assist   General bed mobility comments: continues to required min assist to move LLE to EOB and rotate hips. Increased time to perform  Transfers Overall transfer level: Needs assistance Equipment used: Rolling walker (2 wheeled) Transfers: Sit to/from Stand Sit to Stand: Min guard         General transfer comment: Min guard for safety, cues for hand placement and safety  Ambulation/Gait Ambulation/Gait assistance: Min guard Ambulation Distance (Feet): 120 Feet Assistive device: Rolling walker (2 wheeled) Gait Pattern/deviations: Step-through pattern;Decreased stride length;Antalgic Gait velocity: decreased (improved speed compared to initial session)   General Gait Details: continues to remain unsteady with ambulation, at times with bilateral LE buckling, chair follow required for safety, one seated rest break at 60 ft then return to room  for 120 ft total.    Stairs            Wheelchair Mobility    Modified Rankin (Stroke Patients Only)       Balance Overall balance assessment: Needs assistance Sitting-balance support: No upper extremity supported Sitting balance-Leahy Scale: Good     Standing balance support: Bilateral upper extremity supported Standing balance-Leahy Scale: Fair                      Cognition Arousal/Alertness: Awake/alert Behavior During Therapy: WFL for tasks assessed/performed Overall Cognitive Status: Within Functional Limits for tasks assessed                      Exercises      General Comments        Pertinent Vitals/Pain Pain Assessment: 0-10 Pain Score: 7  Faces Pain Scale: Hurts even more Pain Location: left hip Pain Descriptors / Indicators: Aching;Discomfort;Grimacing;Guarding;Sore Pain Intervention(s): Monitored during session;Repositioned;Relaxation    Home Living Family/patient expects to be discharged to:: Skilled nursing facility [facility]) Living Arrangements: Spouse/significant other;Children Available Help at Discharge: Family Type of Home: House Home Access: Stairs to enter Entrance Stairs-Rails: Right;Left Home Layout: Two level Home Equipment: Environmental consultant - 2 wheels;Bedside commode;Cane - single point      Prior Function Level of Independence: Independent with assistive device(s)          PT Goals (current goals can now be found in the care plan section) Acute Rehab PT Goals Patient Stated Goal: to go to [facility] PT Goal Formulation: With patient Time For Goal Achievement: 03/23/15 Potential to Achieve Goals: Good Progress towards PT goals: Progressing toward goals  Frequency  7X/week    PT Plan Current plan remains appropriate    Co-evaluation             End of Session Equipment Utilized During Treatment: Gait belt Activity Tolerance: Patient tolerated treatment well;Patient limited by fatigue;Patient  limited by pain Patient left: in chair;with call bell/phone within reach;with chair alarm set     Time: 1610-9604 PT Time Calculation (min) (ACUTE ONLY): 19 min  Charges:  $Gait Training: 8-22 mins                    G CodesFabio Asa 03-20-15, 5:38 PM Charlotte Crumb, PT DPT  [phone]

## 2015-03-09 NOTE — Clinical Social Work Placement (Signed)
   CLINICAL SOCIAL WORK PLACEMENT  NOTE  Date:  03/09/2015  Patient Details  Name: Judith Hardy MRN: [id number] Date of Birth: 1970/03/26  Clinical Social Work is seeking post-discharge placement for this patient at the Skilled  Nursing Facility level of care (*CSW will initial, date and re-position this form in  chart as items are completed):  Yes   Patient/family provided with [place] Clinical Social Work Department's list of facilities offering this level of care within the geographic area requested by the patient (or if unable, by the patient's family).  Yes   Patient/family informed of their freedom to choose among providers that offer the needed level of care, that participate in Medicare, Medicaid or managed care program needed by the patient, have an available bed and are willing to accept the patient.      Patient/family informed of [place]'s ownership interest in [facility] and [facility], as well as of the fact that they are under no obligation to receive care at these facilities.  PASRR submitted to EDS on 03/09/15     PASRR number received on 03/09/15     Existing PASRR number confirmed on       FL2 transmitted to all facilities in geographic area requested by pt/family on 03/09/15     FL2 transmitted to all facilities within larger geographic area on       Patient informed that his/her managed care company has contracts with or will negotiate with certain facilities, including the following:        Yes   Patient/family informed of bed offers received.  Patient [facility] bed at [facility]     Physician recommends and patient chooses bed at      Patient to be transferred to [facility] on  .  Patient to be transferred to facility by [facility]     Patient family notified on   of transfer.  Name of family member notified:        PHYSICIAN       Additional Comment:    _______________________________________________ Rondel Baton,  LCSW 03/09/2015, 12:09 PM

## 2015-03-09 NOTE — Clinical Social Work Note (Signed)
[facility] has accepted patient at time of dc for STR.  [name] is Physiological scientist.    Disposition: [facility]  Vickii Penna, LCSW [phone]  5N1-9; 2S 15-16 and Hospital Psychiatric Service Line Licensed Clinical Social Worker

## 2015-03-09 NOTE — Progress Notes (Signed)
Subjective: 1 Day Post-Op Procedure(s) (LRB): TOTAL HIP ARTHROPLASTY ANTERIOR APPROACH (Left)  Activity level:  wbat Diet tolerance:  ok Voiding:  ok Patient reports pain as mild and moderate.    Objective: Vital signs in last 24 hours: Temp:  [97.1 F (36.2 C)-98.4 F (36.9 C)] 98.2 F (36.8 C) (03/01 0300) Pulse Rate:  [50-77] 68 (03/01 0300) Resp:  [11-20] 16 (03/01 0300) BP: (98-132)/(61-94) 110/62 mmHg (03/01 0300) SpO2:  [98 %-100 %] 100 % (03/01 0300)  Labs: No results for input(s): HGB in the last 72 hours. No results for input(s): WBC, RBC, HCT, PLT in the last 72 hours. No results for input(s): NA, K, CL, CO2, BUN, CREATININE, GLUCOSE, CALCIUM in the last 72 hours. No results for input(s): LABPT, INR in the last 72 hours.  Physical Exam:  Neurologically intact ABD soft Neurovascular intact Sensation intact distally Intact pulses distally Dorsiflexion/Plantar flexion intact Incision: dressing C/D/I and no drainage No cellulitis present Compartment soft  Assessment/Plan:  1 Day Post-Op Procedure(s) (LRB): TOTAL HIP ARTHROPLASTY ANTERIOR APPROACH (Left) Advance diet Up with therapy D/C IV fluids Plan for discharge tomorrow Discharge to SNF if doing well and cleared by PT. [facility] per patients request. Follow up in office 2 weeks post op. Continue on ASA  BID x 2 weeks post op. I encouraged patient to get up with PT and she states that she feels better this morning than yesterday.  Sheddrick Lattanzio, Ginger Organ 03/09/2015, 7:32 AM

## 2015-03-09 NOTE — NC FL2 (Signed)
[place] MEDICAID FL2 LEVEL OF CARE SCREENING TOOL     IDENTIFICATION  Patient Name: Judith Hardy Birthdate: 1970-08-09 Sex: female Admission Date (Current Location): 03/08/2015  [facility] and [place] Number:  Producer, television/film/video and Address:  [facility], [address] [place], [place] [address]      Provider Number: [id number]  Attending Physician Name and Address:  Marcene Corning, MD  Relative Name and Phone Number:       Current Level of Care: Hospital Recommended Level of Care: Skilled Nursing Facility Prior Approval Number:    Date Approved/Denied:   PASRR Number: [id number]  Discharge Plan: SNF    Current Diagnoses: Patient Active Problem List   Diagnosis Date Noted  . Primary osteoarthritis of left hip 03/08/2015  . CAP (community acquired pneumonia) 12/24/2014  . Nausea & vomiting 12/24/2014  . Postoperative abdominal pain 05/27/2014  . HNP (herniated nucleus pulposus), lumbar 08/02/2011    Orientation RESPIRATION BLADDER Height & Weight     Self, Time, Situation, Place  Normal Continent Weight: 230 lb (104.327 kg) Height:   (170.2 cm)  BEHAVIORAL SYMPTOMS/MOOD NEUROLOGICAL BOWEL NUTRITION STATUS      Continent  (please see dc summary for dietary needs)  AMBULATORY STATUS COMMUNICATION OF NEEDS Skin   Limited Assist Verbally Surgical wounds                       Personal Care Assistance Level of Assistance  Dressing, Bathing Bathing Assistance: Limited assistance   Dressing Assistance: Limited assistance     Functional Limitations Info             SPECIAL CARE FACTORS FREQUENCY  PT (By licensed PT), OT (By licensed OT)                    Contractures Contractures Info: Not present    Additional Factors Info  Allergies   Allergies Info: acetaminophen, other, tramadol, hydrocodone           Current Medications (03/09/2015):  This is the current hospital active medication list Current  Facility-Administered Medications  Medication Dose Route Frequency Provider Last Rate Last Dose  . acetaminophen (TYLENOL) tablet 650 mg  650 mg Oral Q6H PRN [name], PA-C       Or  . acetaminophen (TYLENOL) suppository 650 mg  650 mg Rectal Q6H PRN [name], PA-C      . alum & mag hydroxide-simeth (MAALOX/MYLANTA) 200-200-20 MG/5ML suspension 30 mL  30 mL Oral Q4H PRN [name], PA-C      . aspirin EC tablet 325 mg  325 mg Oral BID PC [name], PA-C   325 mg at 03/09/15 0935  . bisacodyl (DULCOLAX) EC tablet 5 mg  5 mg Oral Daily PRN [name], PA-C      . clonazePAM Scarlette Calico) tablet 1 mg  1 mg Oral BID [name], PA-C   1 mg at 03/09/15 0935  . diphenhydrAMINE (BENADRYL) 12.5 MG/5ML elixir 12.5-25 mg  12.5-25 mg Oral Q4H PRN [name], PA-C      . docusate sodium (COLACE) capsule 100 mg  100 mg Oral BID [name], PA-C   100 mg at 03/09/15 0935  . gabapentin (NEURONTIN) capsule 900 mg  900 mg Oral QHS [name], PA-C   900 mg at 03/08/15 2145  . HYDROmorphone (DILAUDID) injection 0.5-1 mg  0.5-1 mg Intravenous Q2H PRN [name], PA-C   1 mg at 03/09/15  1145  . HYDROmorphone (DILAUDID) tablet 2-4 mg  2-4 mg Oral Q4H PRN [name], PA-C   4 mg at 03/09/15 1023  . menthol-cetylpyridinium (CEPACOL) lozenge 3 mg  1 lozenge Oral PRN [name], PA-C       Or  . phenol (CHLORASEPTIC) mouth spray 1 spray  1 spray Mouth/Throat PRN [name], PA-C      . methocarbamol (ROBAXIN) tablet 500 mg  500 mg Oral Q6H PRN [name], PA-C   500 mg at 03/09/15 0756   Or  . methocarbamol (ROBAXIN) 500 mg in dextrose 5 % 50 mL IVPB  500 mg Intravenous Q6H PRN [name], PA-C      . metoCLOPramide (REGLAN) tablet 5-10 mg  5-10 mg Oral Q8H PRN [name], PA-C       Or  . metoCLOPramide (REGLAN) injection 5-10 mg  5-10 mg Intravenous Q8H PRN [name], PA-C      . ondansetron [facility]) tablet 4 mg  4 mg Oral Q6H PRN [name], PA-C       Or  . ondansetron [facility]) injection 4  mg  4 mg Intravenous Q6H PRN [name], PA-C      . propranolol (INDERAL) tablet 10 mg  10 mg Oral BID [name], PA-C   10 mg at 03/09/15 0935  . traZODone (DESYREL) tablet 250 mg  250 mg Oral QHS PRN [name], PA-C   250 mg at 03/08/15 2214  . venlafaxine XR (EFFEXOR-XR) 24 hr capsule 300 mg  300 mg Oral Daily [name], PA-C   300 mg at 03/09/15 1610     Discharge Medications: Please see discharge summary for a list of discharge medications.  Relevant Imaging Results:  Relevant Lab Results:   Additional Information SSN: [id number]  Rondel Baton, LCSW

## 2015-03-09 NOTE — Evaluation (Signed)
Occupational Therapy Evaluation Patient Details Name: Judith Hardy MRN: [id number] DOB: [birth year] Today's Date: 03/09/2015    History of Present Illness 45 y.o. female with history of chronic back pain and sciatica, hip osteoarthritis, who presents  s/p ;eft THA anterior approach.   Clinical Impression   Patient presenting with decreased ADL and functional mobility independence secondary to above. Patient independent PTA. Patient currently functioning at an overall min to max assist level. Patient will benefit from acute OT to increase overall independence in the areas of ADLs, functional mobility, and overall safety in order to safely discharge to venue listed below.     Follow Up Recommendations  SNF;Supervision/Assistance - 24 hour    Equipment Recommendations  Other (comment) (TBD next venue of care)    Recommendations for Other Services  None at this time    Precautions / Restrictions Precautions Precautions: Fall Restrictions Weight Bearing Restrictions: Yes LLE Weight Bearing: Weight bearing as tolerated    Mobility Bed Mobility Overal bed mobility: Needs Assistance Bed Mobility: Sit to Supine       Sit to supine: Min assist   General bed mobility comments: Assistance for management of LLE  Transfers Overall transfer level: Needs assistance Equipment used: Rolling walker (2 wheeled) Transfers: Sit to/from Stand Sit to Stand: Min guard General transfer comment: Min guard for safety, cues for hand placement and safety    Balance Overall balance assessment: Needs assistance Sitting-balance support: No upper extremity supported;Feet supported Sitting balance-Leahy Scale: Good     Standing balance support: During functional activity;Bilateral upper extremity supported Standing balance-Leahy Scale: Fair    ADL Overall ADL's : Needs assistance/impaired Eating/Feeding: Set up;Sitting   Grooming: Min guard;Standing   Upper Body Bathing: Set up;Sitting    Lower Body Bathing: Moderate assistance;Sit to/from stand   Upper Body Dressing : Set up;Sitting   Lower Body Dressing: Maximal assistance;Sit to/from stand   Toilet Transfer: Minimal assistance;Ambulation;RW;BSC   Toileting- Architect and Hygiene: Min guard;Sit to/from Nurse, children's Details (indicate cue type and reason): did not occur  Functional mobility during ADLs: Minimal assistance;Cueing for safety;Cueing for sequencing;Rolling walker      Pertinent Vitals/Pain Pain Assessment: Faces Faces Pain Scale: Hurts even more Pain Location: left hip Pain Descriptors / Indicators: Aching;Sore;Grimacing;Guarding Pain Intervention(s): Limited activity within patient's tolerance;Monitored during session;Repositioned;Ice applied     Hand Dominance Right   Extremity/Trunk Assessment Upper Extremity Assessment Upper Extremity Assessment: Overall WFL for tasks assessed   Lower Extremity Assessment Lower Extremity Assessment: Defer to PT evaluation   Cervical / Trunk Assessment Cervical / Trunk Assessment: Normal   Communication Communication Communication: No difficulties   Cognition Arousal/Alertness: Awake/alert Behavior During Therapy: WFL for tasks assessed/performed Overall Cognitive Status: Within Functional Limits for tasks assessed             Home Living Family/patient expects to be discharged to:: Skilled nursing facility [facility]) Living Arrangements: Spouse/significant other;Children Available Help at Discharge: Family Type of Home: House Home Access: Stairs to enter Secretary/administrator of Steps: 7 Entrance Stairs-Rails: Right;Left Home Layout: Two level Alternate Level Stairs-Number of Steps: 16   Bathroom Shower/Tub: Producer, television/film/video: Standard     Home Equipment: Environmental consultant - 2 wheels;Bedside commode;Cane - single point   Prior Functioning/Environment Level of Independence: Independent with assistive  device(s)     OT Diagnosis: Generalized weakness;Acute pain   OT Problem List: Decreased strength;Decreased range of motion;Decreased activity tolerance;Impaired balance (sitting and/or standing);Decreased safety awareness;Decreased  knowledge of use of DME or AE;Decreased knowledge of precautions;Pain   OT Treatment/Interventions: Self-care/ADL training;Therapeutic exercise;DME and/or AE instruction;Therapeutic activities;Patient/family education;Balance training    OT Goals(Current goals can be found in the care plan section) Acute Rehab OT Goals Patient Stated Goal: to go to [facility] OT Goal Formulation: With patient Time For Goal Achievement: 03/23/15 Potential to Achieve Goals: Good ADL Goals Pt Will Perform Grooming: with modified independence;standing Pt Will Perform Lower Body Bathing: with supervision;sit to/from stand;with adaptive equipment Pt Will Perform Lower Body Dressing: with supervision;with adaptive equipment;sit to/from stand Pt Will Transfer to Toilet: with supervision;ambulating;bedside commode Additional ADL Goal #1: Pt will be supervision for functional ambulation using RW during ADL  OT Frequency: Min 2X/week   Barriers to D/C: Decreased caregiver support   End of Session Equipment Utilized During Treatment: Gait belt;Rolling walker  Activity Tolerance: Patient tolerated treatment well Patient left: in bed;with call bell/phone within reach   Time: 6962-9528 OT Time Calculation (min): 15 min Charges:  OT General Charges $OT Visit: 1 Procedure OT Evaluation $OT Eval Moderate Complexity: 1 Procedure  Edwin Cap , MS, OTR/L, CLT Pager: [phone]  03/09/2015, 3:56 PM

## 2015-03-10 MED ORDER — ASPIRIN 325 MG PO TBEC: 325.0000 mg | DELAYED_RELEASE_TABLET | Freq: Two times a day (BID) | ORAL | Status: DC

## 2015-03-10 MED ORDER — HYDROMORPHONE HCL 4 MG PO TABS: 2.0000 mg | ORAL_TABLET | ORAL | Status: DC | PRN

## 2015-03-10 MED ORDER — METHOCARBAMOL 500 MG PO TABS: 500.0000 mg | ORAL_TABLET | Freq: Four times a day (QID) | ORAL | Status: DC | PRN

## 2015-03-10 NOTE — Discharge Summary (Signed)
Patient ID: Judith Hardy MRN: [id number] DOB/AGE: June 26, 1970 44 y.o.  Admit date: 03/08/2015 Discharge date: 03/10/2015  Admission Diagnoses:  Principal Problem:   Primary osteoarthritis of left hip   Discharge Diagnoses:  Same  Past Medical History  Diagnosis Date  . Anxiety   . Depression   . Arthritis   . Osteoarthritis   . Sciatica   . Pneumonia 12/16  . Chronic lower back pain     Surgeries: Procedure(s): TOTAL HIP ARTHROPLASTY ANTERIOR APPROACH on 03/08/2015   Consultants:    Discharged Condition: Improved  Hospital Course: Judith Hardy is an 45 y.o. female who was admitted 03/08/2015 for operative treatment ofPrimary osteoarthritis of left hip. Patient has severe unremitting pain that affects sleep, daily activities, and work/hobbies. After pre-op clearance the patient was taken to the operating room on 03/08/2015 and underwent  Procedure(s): TOTAL HIP ARTHROPLASTY ANTERIOR APPROACH.    Patient was given perioperative antibiotics: Anti-infectives    Start     Dose/Rate Route Frequency Ordered Stop   03/08/15 1330  ceFAZolin (ANCEF) IVPB 2 g/50 mL premix     2 g 100 mL/hr over 30 Minutes Intravenous Every 6 hours 03/08/15 1134 03/08/15 1850   03/08/15 0600  ceFAZolin (ANCEF) IVPB 2 g/50 mL premix     2 g 100 mL/hr over 30 Minutes Intravenous To ShortStay Surgical 03/07/15 1240 03/08/15 0759       Patient was given sequential compression devices, early ambulation, and chemoprophylaxis to prevent DVT.  Patient benefited maximally from hospital stay and there were no complications.    Recent vital signs: Patient Vitals for the past 24 hrs:  BP Temp Temp src Pulse Resp SpO2  03/10/15 1300 116/63 mmHg - - 88 18 100 %  03/10/15 0500 112/62 mmHg 98.1 F (36.7 C) Oral 90 18 100 %  03/09/15 2100 (!) 101/54 mmHg 97.5 F (36.4 C) Oral 84 18 100 %     Recent laboratory studies: No results for input(s): WBC, HGB, HCT, PLT, NA, K, CL, CO2, BUN, CREATININE, GLUCOSE,  INR, CALCIUM in the last 72 hours.  Invalid input(s): PT, 2   Discharge Medications:     Medication List    STOP taking these medications        diclofenac 50 MG EC tablet  Commonly known as:  VOLTAREN      TAKE these medications        aspirin 325 MG EC tablet  Take 1 tablet (325 mg total) by mouth 2 (two) times daily after a meal.     clonazePAM 1 MG tablet  Commonly known as:  KLONOPIN  Take 1 mg by mouth 2 (two) times daily.     EVZIO 0.4 MG/0.4ML Soaj  Generic drug:  Naloxone HCl  0.4 mg once as needed (emergency opiod overdose).     gabapentin 300 MG capsule  Commonly known as:  NEURONTIN  Take 900 mg by mouth at bedtime.     HYDROmorphone 4 MG tablet  Commonly known as:  DILAUDID  Take 0.5-1 tablets (2-4 mg total) by mouth every 4 (four) hours as needed for moderate pain or severe pain.     methocarbamol 500 MG tablet  Commonly known as:  ROBAXIN  Take 1 tablet (500 mg total) by mouth every 6 (six) hours as needed for muscle spasms.     NON FORMULARY  Take 4 capsules by mouth daily. super lysine     Omega 3 1000 MG Caps  Take 2 capsules by mouth daily.  propranolol 10 MG tablet  Commonly known as:  INDERAL  Take 10 mg by mouth 2 (two) times daily.     traZODone 100 MG tablet  Commonly known as:  DESYREL  Take 250 mg by mouth at bedtime as needed for sleep.     venlafaxine XR 150 MG 24 hr capsule  Commonly known as:  EFFEXOR-XR  Take 300 mg by mouth daily.        Diagnostic Studies: Dg Chest 2 View  02/28/2015  CLINICAL DATA:  Preop total hip replacement EXAM: CHEST  2 VIEW COMPARISON:  12/24/2014 FINDINGS: Interval resolution of the previously seen right upper lobe pneumonia. Lungs are clear. No effusions. Heart is normal size. No acute bony abnormality. IMPRESSION: Resolved right upper lobe pneumonia.  No active disease. Electronically Signed   By: Charlett Nose M.D.   On: 02/28/2015 08:58   Dg Hip Operative Unilat W Or W/o Pelvis  Left  03/08/2015  CLINICAL DATA:  45 year old undergoing left total hip arthroplasty from anterior approach for osteoarthritis. EXAM: OPERATIVE LEFT HIP (WITH PELVIS IF PERFORMED) 1 VIEW TECHNIQUE: Fluoroscopic spot image(s) were submitted for interpretation post-operatively. COMPARISON:  Bone window images from CT abdomen and pelvis 10/15/2014. FINDINGS: Two spot images, AP views of the left hip, demonstrate anatomic alignment of the left hip prosthesis. No acute complicating features. The radiologic technologist documented 42 sec of fluoroscopy time. IMPRESSION: Anatomic alignment of the left total hip arthroplasty in the AP projection without complicating features. Electronically Signed   By: Hulan Saas M.D.   On: 03/08/2015 09:51    Disposition: 01-Home or Self Care      Discharge Instructions    Call MD / Call 911    Complete by:  As directed   If you experience chest pain or shortness of breath, CALL 911 and be transported to the hospital emergency room.  If you develope a fever above 101 F, pus (white drainage) or increased drainage or redness at the wound, or calf pain, call your surgeon's office.     Constipation Prevention    Complete by:  As directed   Drink plenty of fluids.  Prune juice may be helpful.  You may use a stool softener, such as Colace (over the counter) 100 mg twice a day.  Use MiraLax (over the counter) for constipation as needed.     Diet - low sodium heart healthy    Complete by:  As directed      Discharge instructions    Complete by:  As directed   INSTRUCTIONS AFTER JOINT REPLACEMENT   Remove items at home which could result in a fall. This includes throw rugs or furniture in walking pathways ICE to the affected joint every three hours while awake for 30 minutes at a time, for at least the first 3-5 days, and then as needed for pain and swelling.  Continue to use ice for pain and swelling. You may notice swelling that will progress down to the foot and  ankle.  This is normal after surgery.  Elevate your leg when you are not up walking on it.   Continue to use the breathing machine you got in the hospital (incentive spirometer) which will help keep your temperature down.  It is common for your temperature to cycle up and down following surgery, especially at night when you are not up moving around and exerting yourself.  The breathing machine keeps your lungs expanded and your temperature down.   DIET:  As  you were doing prior to hospitalization, we recommend a well-balanced diet.  DRESSING / WOUND CARE / SHOWERING  You may shower 3 days after surgery, but keep the wounds dry during showering.  You may use an occlusive plastic wrap (Press'n Seal for example), NO SOAKING/SUBMERGING IN THE BATHTUB.  If the bandage gets wet, change with a clean dry gauze.  If the incision gets wet, pat the wound dry with a clean towel.  ACTIVITY  Increase activity slowly as tolerated, but follow the weight bearing instructions below.   No driving for 6 weeks or until further direction given by your physician.  You cannot drive while taking narcotics.  No lifting or carrying greater than 10 lbs. until further directed by your surgeon. Avoid periods of inactivity such as sitting longer than an hour when not asleep. This helps prevent blood clots.  You may return to work once you are authorized by your doctor.     WEIGHT BEARING   Weight bearing as tolerated with assist device (walker, cane, etc) as directed, use it as long as suggested by your surgeon or therapist, typically at least 4-6 weeks.   EXERCISES  Results after joint replacement surgery are often greatly improved when you follow the exercise, range of motion and muscle strengthening exercises prescribed by your doctor. Safety measures are also important to protect the joint from further injury. Any time any of these exercises cause you to have increased pain or swelling, decrease what you are doing  until you are comfortable again and then slowly increase them. If you have problems or questions, call your caregiver or physical therapist for advice.   Rehabilitation is important following a joint replacement. After just a few days of immobilization, the muscles of the leg can become weakened and shrink (atrophy).  These exercises are designed to build up the tone and strength of the thigh and leg muscles and to improve motion. Often times heat used for twenty to thirty minutes before working out will loosen up your tissues and help with improving the range of motion but do not use heat for the first two weeks following surgery (sometimes heat can increase post-operative swelling).   These exercises can be done on a training (exercise) mat, on the floor, on a table or on a bed. Use whatever works the best and is most comfortable for you.    Use music or television while you are exercising so that the exercises are a pleasant break in your day. This will make your life better with the exercises acting as a break in your routine that you can look forward to.   Perform all exercises about fifteen times, three times per day or as directed.  You should exercise both the operative leg and the other leg as well.   Exercises include:   Quad Sets - Tighten up the muscle on the front of the thigh (Quad) and hold for 5-10 seconds.   Straight Leg Raises - With your knee straight (if you were given a brace, keep it on), lift the leg to 60 degrees, hold for 3 seconds, and slowly lower the leg.  Perform this exercise against resistance later as your leg gets stronger.  Leg Slides: Lying on your back, slowly slide your foot toward your buttocks, bending your knee up off the floor (only go as far as is comfortable). Then slowly slide your foot back down until your leg is flat on the floor again.  Angel Wings: Lying on your  back spread your legs to the side as far apart as you can without causing discomfort.  Hamstring  Strength:  Lying on your back, push your heel against the floor with your leg straight by tightening up the muscles of your buttocks.  Repeat, but this time bend your knee to a comfortable angle, and push your heel against the floor.  You may put a pillow under the heel to make it more comfortable if necessary.   A rehabilitation program following joint replacement surgery can speed recovery and prevent re-injury in the future due to weakened muscles. Contact your doctor or a physical therapist for more information on knee rehabilitation.    CONSTIPATION  Constipation is defined medically as fewer than three stools per week and severe constipation as less than one stool per week.  Even if you have a regular bowel pattern at home, your normal regimen is likely to be disrupted due to multiple reasons following surgery.  Combination of anesthesia, postoperative narcotics, change in appetite and fluid intake all can affect your bowels.   YOU MUST use at least one of the following options; they are listed in order of increasing strength to get the job done.  They are all available over the counter, and you may need to use some, POSSIBLY even all of these options:    Drink plenty of fluids (prune juice may be helpful) and high fiber foods Colace 100 mg by mouth twice a day  Senokot for constipation as directed and as needed Dulcolax (bisacodyl), take with full glass of water  Miralax (polyethylene glycol) once or twice a day as needed.  If you have tried all these things and are unable to have a bowel movement in the first 3-4 days after surgery call either your surgeon or your primary doctor.    If you experience loose stools or diarrhea, hold the medications until you stool forms back up.  If your symptoms do not get better within 1 week or if they get worse, check with your doctor.  If you experience "the worst abdominal pain ever" or develop nausea or vomiting, please contact the office immediately  for further recommendations for treatment.   ITCHING:  If you experience itching with your medications, try taking only a single pain pill, or even half a pain pill at a time.  You can also use Benadryl over the counter for itching or also to help with sleep.   TED HOSE STOCKINGS:  Use stockings on both legs until for at least 2 weeks or as directed by physician office. They may be removed at night for sleeping.  MEDICATIONS:  See your medication summary on the "After Visit Summary" that nursing will review with you.  You may have some home medications which will be placed on hold until you complete the course of blood thinner medication.  It is important for you to complete the blood thinner medication as prescribed.  PRECAUTIONS:  If you experience chest pain or shortness of breath - call 911 immediately for transfer to the hospital emergency department.   If you develop a fever greater that 101 F, purulent drainage from wound, increased redness or drainage from wound, foul odor from the wound/dressing, or calf pain - CONTACT YOUR SURGEON.  FOLLOW-UP APPOINTMENTS:  If you do not already have a post-op appointment, please call the office for an appointment to be seen by your surgeon.  Guidelines for how soon to be seen are listed in your "After Visit Summary", but are typically between 1-4 weeks after surgery.  OTHER INSTRUCTIONS:   Knee Replacement:  Do not place pillow under knee, focus on keeping the knee straight while resting. CPM instructions: 0-90 degrees, 2 hours in the morning, 2 hours in the afternoon, and 2 hours in the evening. Place foam block, curve side up under heel at all times except when in CPM or when walking.  DO NOT modify, tear, cut, or change the foam block in any way.  MAKE SURE YOU:  Understand these instructions.  Get help right away if you are not doing well or get worse.    Thank you for letting us be a part of  your medical care team.  It is a privilege we respect greatly.  We hope these instructions will help you stay on track for a fast and full recovery!     Increase activity slowly as tolerated    Complete by:  As directed            Follow-up Information    Follow up with Velna Ochs, MD. Schedule an appointment as soon as possible for a visit in 2 weeks.   Specialty:  Orthopedic Surgery   Contact information:   [address] [place] [address] [phone]       Follow up with [facility] .   Specialty:  Skilled Nursing Facility   Contact information:   [address] [place] [address] [phone]       Signed: Drema Halon 03/10/2015, 2:05 PM   \

## 2015-03-10 NOTE — Progress Notes (Signed)
Pt ready for d/c to SNF today per MD. Report was called to Joy, supervising RN, at [facility], all questions answered. Pt's peripheral IV removed, belongings gathered and will be sent with pt to facility. Awaiting transportation via [facility]. Will continue to monitor until that time.   Lowella Dell  03/10/2015

## 2015-03-10 NOTE — Clinical Social Work Note (Addendum)
Patient will discharge today per MD order. Patient will discharge to: [facility] RN to call report prior to transportation to: [phone] Transportation: [facility]- pending SNF readiness and admission's paperwork being signed by SNF liaison  CSW sent discharge summary to SNF for review.  Packet is complete.  RN, patient and family aware of discharge plans.  Vickii Penna, LCSW [phone]  [id number], 2S 15-16 and Psychiatric Service Line  Licensed Clinical Social Worker

## 2015-03-10 NOTE — Progress Notes (Signed)
Physical Therapy Treatment Patient Details Name: Judith Hardy MRN: [id number] DOB: [birth year] Today's Date: 03/10/2015    History of Present Illness 45 y.o. female with history of chronic back pain and sciatica, hip osteoarthritis, who presents  s/p ;eft THA anterior approach.    PT Comments    Patient continues to make progress towards PT goals but still requires assist for mobility and safety. Patient did tolerate some ambulation with seated rest breaks as well as education regarding exercise program. Anticipate patient will progress well with ST SNF rehabilitation.   Follow Up Recommendations  SNF;Supervision for mobility/OOB     Equipment Recommendations  None recommended by PT    Recommendations for Other Services       Precautions / Restrictions Precautions Precautions: Fall Restrictions Weight Bearing Restrictions: Yes LLE Weight Bearing: Weight bearing as tolerated    Mobility  Bed Mobility Overal bed mobility: Needs Assistance Bed Mobility: Supine to Sit     Supine to sit: Min assist     General bed mobility comments: Still requiring assist for LLE. Improvements in ability to power to upright at EOB and scoot to reposition. Increased pain during transition  Transfers Overall transfer level: Needs assistance Equipment used: Rolling walker (2 wheeled) Transfers: Sit to/from Stand Sit to Stand: Min guard         General transfer comment: performed from bed and BSC over toilet with grab bar  Ambulation/Gait Ambulation/Gait assistance: Min guard Ambulation Distance (Feet): 130 Feet (2 seated rest breaks) Assistive device: Rolling walker (2 wheeled) Gait Pattern/deviations: Step-through pattern;Decreased stride length;Antalgic Gait velocity: decreased (improved speed compared to initial session)   General Gait Details: Improvements in sequencing and pacing   Stairs            Wheelchair Mobility    Modified Rankin (Stroke Patients Only)        Balance   Sitting-balance support: No upper extremity supported Sitting balance-Leahy Scale: Good     Standing balance support: Bilateral upper extremity supported Standing balance-Leahy Scale: Fair                      Cognition Arousal/Alertness: Awake/alert Behavior During Therapy: WFL for tasks assessed/performed Overall Cognitive Status: Within Functional Limits for tasks assessed                      Exercises      General Comments        Pertinent Vitals/Pain Pain Assessment: 0-10 Pain Score: 6  Pain Location: left hip  Pain Descriptors / Indicators: Aching;Grimacing;Guarding;Operative site guarding Pain Intervention(s): Monitored during session;Premedicated before session    Home Living                      Prior Function            PT Goals (current goals can now be found in the care plan section) Acute Rehab PT Goals Patient Stated Goal: to go to [facility] PT Goal Formulation: With patient Time For Goal Achievement: 03/23/15 Potential to Achieve Goals: Good Progress towards PT goals: Progressing toward goals    Frequency  7X/week    PT Plan Current plan remains appropriate    Co-evaluation             End of Session Equipment Utilized During Treatment: Gait belt Activity Tolerance: Patient tolerated treatment well;Patient limited by fatigue;Patient limited by pain Patient left: in chair;with call bell/phone within reach;with chair alarm set  Time: 4098-1191 PT Time Calculation (min) (ACUTE ONLY): 18 min  Charges:  $Gait Training: 8-22 mins                    G CodesFabio Asa Mar 31, 2015, 9:09 AM Charlotte Crumb, PT DPT  [phone]

## 2015-03-10 NOTE — Progress Notes (Signed)
Subjective: 2 Days Post-Op Procedure(s) (LRB): TOTAL HIP ARTHROPLASTY ANTERIOR APPROACH (Left)  Activity level:  wbat Diet tolerance:  ok Voiding:  ok Patient reports pain as mild and moderate.    Objective: Vital signs in last 24 hours: Temp:  [97.5 F (36.4 C)-98.1 F (36.7 C)] 98.1 F (36.7 C) (03/02 0500) Pulse Rate:  [84-90] 88 (03/02 1300) Resp:  [18] 18 (03/02 1300) BP: (101-116)/(54-63) 116/63 mmHg (03/02 1300) SpO2:  [100 %] 100 % (03/02 1300)  Labs: No results for input(s): HGB in the last 72 hours. No results for input(s): WBC, RBC, HCT, PLT in the last 72 hours. No results for input(s): NA, K, CL, CO2, BUN, CREATININE, GLUCOSE, CALCIUM in the last 72 hours. No results for input(s): LABPT, INR in the last 72 hours.  Physical Exam:  Neurologically intact ABD soft Neurovascular intact Sensation intact distally Intact pulses distally Dorsiflexion/Plantar flexion intact Incision: dressing C/D/I and no drainage No cellulitis present Compartment soft  Assessment/Plan:  2 Days Post-Op Procedure(s) (LRB): TOTAL HIP ARTHROPLASTY ANTERIOR APPROACH (Left) Advance diet Up with therapy Discharge to SNF [facility] today. Continue on ASA  BID x 4 weeks post op. Follow up in offcie 2 weeks post op.  Judith Hardy, Ginger Organ 03/10/2015, 2:05 PM

## 2015-03-11 ENCOUNTER — Non-Acute Institutional Stay (SKILLED_NURSING_FACILITY): Payer: Medicare HMO | Admitting: Internal Medicine

## 2015-03-11 ENCOUNTER — Other Ambulatory Visit: Payer: Self-pay | Admitting: *Deleted

## 2015-03-11 ENCOUNTER — Encounter: Payer: Self-pay | Admitting: Internal Medicine

## 2015-03-11 DIAGNOSIS — K5900 Constipation, unspecified: Secondary | ICD-10-CM | POA: Diagnosis not present

## 2015-03-11 DIAGNOSIS — D72829 Elevated white blood cell count, unspecified: Secondary | ICD-10-CM

## 2015-03-11 DIAGNOSIS — F411 Generalized anxiety disorder: Secondary | ICD-10-CM | POA: Diagnosis not present

## 2015-03-11 DIAGNOSIS — R2681 Unsteadiness on feet: Secondary | ICD-10-CM | POA: Diagnosis not present

## 2015-03-11 DIAGNOSIS — D62 Acute posthemorrhagic anemia: Secondary | ICD-10-CM | POA: Diagnosis not present

## 2015-03-11 DIAGNOSIS — F329 Major depressive disorder, single episode, unspecified: Secondary | ICD-10-CM | POA: Diagnosis not present

## 2015-03-11 DIAGNOSIS — M792 Neuralgia and neuritis, unspecified: Secondary | ICD-10-CM

## 2015-03-11 DIAGNOSIS — M5126 Other intervertebral disc displacement, lumbar region: Secondary | ICD-10-CM

## 2015-03-11 DIAGNOSIS — I10 Essential (primary) hypertension: Secondary | ICD-10-CM

## 2015-03-11 DIAGNOSIS — F32A Depression, unspecified: Secondary | ICD-10-CM

## 2015-03-11 DIAGNOSIS — M1612 Unilateral primary osteoarthritis, left hip: Secondary | ICD-10-CM | POA: Diagnosis not present

## 2015-03-11 MED ORDER — HYDROMORPHONE HCL 4 MG PO TABS: ORAL_TABLET | ORAL | Status: DC

## 2015-03-11 NOTE — Telephone Encounter (Signed)
[facility] 

## 2015-03-11 NOTE — Progress Notes (Signed)
LOCATION: [facility]  PCP: Verlon Au, MD   Code Status: Full Code  Goals of care: Advanced Directive information Advanced Directives 03/08/2015  Does patient have an advance directive? No  Would patient like information on creating an advanced directive? No - patient declined information       Extended Emergency Contact Information Primary Emergency Contact: James,Robert Address: [address]          [place], [place] [address] [place] Phone: [phone] Relation: Spouse Secondary Emergency Contact: Berton Mount, [place] [address] [place] Phone: [phone] Relation: Aunt   Allergies  Allergen Reactions  . Acetaminophen Other (See Comments)    Headache  . Other Other (See Comments)    NO STEROIDS - causes white blood cell count to go up  . Tramadol Other (See Comments)    headaches  . Hydrocodone Itching    Chief Complaint  Patient presents with  . New Admit To SNF    New Admission     HPI:  Patient is a 45 y.o. female seen today for short term rehabilitation post hospital admission from 03/08/15-03/10/15 with left hip OA. She underwent left hip arthroplasty. She is seen in her room today. Her pain medication has not been helping her. No other concerns.   Review of Systems:  Constitutional: Negative for fever, chills, diaphoresis.  HENT: Negative for headache, congestion, nasal discharge Eyes: Negative for blurred vision, double vision and discharge.  Respiratory: Negative for cough, shortness of breath and wheezing.   Cardiovascular: Negative for chest pain, palpitations, orthopnea, leg swelling.  Gastrointestinal: Negative for heartburn, nausea, vomiting, abdominal pain. Had bowel movement on Tuesday Genitourinary: Negative for dysuria Musculoskeletal: Negative for back pain, fall in the facility  Skin: Negative for itching, rash.  Neurological: Negative for  dizziness Psychiatric/Behavioral: Negative for depression   Past Medical History  Diagnosis Date  . Anxiety   . Depression   . Arthritis   . Osteoarthritis   . Sciatica   . Pneumonia 12/16  . Chronic lower back pain    Past Surgical History  Procedure Laterality Date  . Appendectomy  2011  . Lumbar laminectomy/decompression microdiscectomy  01/10/2011    Procedure: LUMBAR LAMINECTOMY/DECOMPRESSION MICRODISCECTOMY;  Surgeon: Javier Docker;  Location: [facility];  Service: Orthopedics;  Laterality: N/A;  Decompression L4 - L5  (X-Ray)  . Lumbar laminectomy/decompression microdiscectomy  08/02/2011    Procedure: LUMBAR LAMINECTOMY/DECOMPRESSION MICRODISCECTOMY;  Surgeon: Javier Docker, MD;  Location: [facility];  Service: Orthopedics;  Laterality: N/A;  Re-do Decompression L4-L5  . Back surgery    . Total hip arthroplasty Left 03/08/2015    ANTERIOR APPROACH  . Joint replacement    . Hernia repair  4/16    umbilical  . Total hip arthroplasty Left 03/08/2015    Procedure: TOTAL HIP ARTHROPLASTY ANTERIOR APPROACH;  Surgeon: Marcene Corning, MD;  Location: [facility] OR;  Service: Orthopedics;  Laterality: Left;   Social History:   reports that she has never smoked. She has never used smokeless tobacco. She reports that she does not drink alcohol or use illicit drugs.  Family History  Problem Relation Age of Onset  . Heart failure Mother     Medications:   Medication List       This list is accurate as of: 03/11/15 10:11 AM.  Always use your most recent med list.  aspirin 325 MG EC tablet  Take 1 tablet (325 mg total) by mouth 2 (two) times daily after a meal.     clonazePAM 1 MG tablet  Commonly known as:  KLONOPIN  Take 1 mg by mouth 2 (two) times daily.     gabapentin 300 MG capsule  Commonly known as:  NEURONTIN  Take 300 mg by mouth at bedtime.     HYDROmorphone 4 MG tablet  Commonly known as:  DILAUDID  Take 0.5-1 tablets (2-4 mg total) by mouth every 4 (four)  hours as needed for moderate pain or severe pain.     methocarbamol 500 MG tablet  Commonly known as:  ROBAXIN  Take 1 tablet (500 mg total) by mouth every 6 (six) hours as needed for muscle spasms.     Omega 3 1000 MG Caps  Take 2 capsules by mouth daily.     propranolol 10 MG tablet  Commonly known as:  INDERAL  Take 10 mg by mouth 2 (two) times daily.     traZODone 100 MG tablet  Commonly known as:  DESYREL  Take 250 mg by mouth at bedtime as needed for sleep.     venlafaxine XR 150 MG 24 hr capsule  Commonly known as:  EFFEXOR-XR  Take 300 mg by mouth daily.        Immunizations: Immunization History  Administered Date(s) Administered  . Influenza,inj,Quad PF,36+ Mos 03/09/2015     Physical Exam: Filed Vitals:   03/11/15 1000  BP: 114/74  Pulse: 93  Temp: 96.8 F (36 C)  TempSrc: Oral  Resp: 18  Height: 5\' 7"  (1.702 m)  Weight: 230 lb (104.327 kg)  SpO2: 98%   Body mass index is 36.01 kg/(m^2).  General- elderly female, obese, in no acute distress Head- normocephalic, atraumatic Nose- no maxillary or frontal sinus tenderness, no nasal discharge Throat- moist mucus membrane  Eyes- PERRLA, EOMI, no pallor, no icterus, no discharge Neck- no cervical lymphadenopathy Cardiovascular- normal s1,s2, no murmur Respiratory- bilateral clear to auscultation, no wheeze, no rhonchi, no crackles, no use of accessory muscles Abdomen- bowel sounds present, soft, non tender Musculoskeletal- able to move all 4 extremities, limited left hip range of motion Neurological- no focal deficit, alert and oriented to person, place and time Skin- warm and dry, left hip surgical incision with aquacel dressing Psychiatry- normal mood and affect    Labs reviewed: Basic Metabolic Panel:  Recent Labs  16/10/9608/07/16 0921 12/24/14 0858 02/28/15 0845  NA 137 140 138  K 3.9 3.8 3.9  CL 103 104 104  CO2 26 26 23   GLUCOSE 103* 116* 100*  BUN 16 10 11   CREATININE 0.82 0.93 0.85   CALCIUM 10.2 9.1 9.6   Liver Function Tests:  Recent Labs  06/25/14 1200 10/15/14 0921 12/24/14 0858  AST 19 13* 23  ALT 26 16 29   ALKPHOS 119 92 89  BILITOT 0.4 0.5 0.5  PROT 8.3* 8.5* 7.5  ALBUMIN 4.0 4.1 3.7    Recent Labs  06/25/14 1200 10/15/14 0921 12/24/14 0858  LIPASE 13* 19* 24   No results for input(s): AMMONIA in the last 8760 hours. CBC:  Recent Labs  10/15/14 0921 12/24/14 0858 12/25/14 0440 02/28/15 0845  WBC 26.9* 12.2* 11.7* 11.6*  NEUTROABS 20.4* 7.9*  --  8.6*  HGB 14.4 12.6 12.0 13.4  HCT 43.2 38.1 36.4 41.5  MCV 99.1 98.4 98.9 99.5  PLT 374 504* 462* 440*   Cardiac Enzymes: No results for input(s): CKTOTAL,  CKMB, CKMBINDEX, TROPONINI in the last 8760 hours. BNP: Invalid input(s): POCBNP CBG:  Recent Labs  06/25/14 1152  GLUCAP 106*    Radiological Exams: Dg Chest 2 View  02/28/2015  CLINICAL DATA:  Preop total hip replacement EXAM: CHEST  2 VIEW COMPARISON:  12/24/2014 FINDINGS: Interval resolution of the previously seen right upper lobe pneumonia. Lungs are clear. No effusions. Heart is normal size. No acute bony abnormality. IMPRESSION: Resolved right upper lobe pneumonia.  No active disease. Electronically Signed   By: Charlett Nose M.D.   On: 02/28/2015 08:58   Dg Hip Operative Unilat W Or W/o Pelvis Left  03/08/2015  CLINICAL DATA:  45 year old undergoing left total hip arthroplasty from anterior approach for osteoarthritis. EXAM: OPERATIVE LEFT HIP (WITH PELVIS IF PERFORMED) 1 VIEW TECHNIQUE: Fluoroscopic spot image(s) were submitted for interpretation post-operatively. COMPARISON:  Bone window images from CT abdomen and pelvis 10/15/2014. FINDINGS: Two spot images, AP views of the left hip, demonstrate anatomic alignment of the left hip prosthesis. No acute complicating features. The radiologic technologist documented 42 sec of fluoroscopy time. IMPRESSION: Anatomic alignment of the left total hip arthroplasty in the AP projection  without complicating features. Electronically Signed   By: Hulan Saas M.D.   On: 03/08/2015 09:51    Assessment/Plan  Unsteady gait With left hip surgery. Will have patient work with PT/OT as tolerated to regain strength and restore function.  Fall precautions are in place.  Left hip OA S/p left hip arthroplasty. Will have her work with physical therapy and occupational therapy team to help with gait training and muscle strengthening exercises.fall precautions. Skin care. Encourage to be out of bed. Continue aspirin ec 325 mg bid for dvt prophylaxis. Continue dilaudid 4 mg half to one tablet q4h prn pain and get physiatry consult for pain management. Continue robaxin 500 mg q6h prn muscle spasm.   Leukocytosis Afebrile. Monitor cbc with diff  Blood loss anemia Post op, monitor cbc  Constipation Start colace 100 mg bid and miralax bid x 2 days, then daily and monitor  HTN Continue propranolol 10 mg bid, check bp daily for now  Chronic depression Continue effexor 300 mg daily and trazodone 250 mg qhs prn.   GAD Continue klonopin 1 mg bid  Lumbar herniated nucleus pulposus Continue dilaudid as above, followed by pain clinic as outpatient, get physiatry consult   Neuropathic pain Continue gabapentin 900 mg qhs    Goals of care: short term rehabilitation   Labs/tests ordered: cbc, cmp  Family/ staff Communication: reviewed care plan with patient and nursing supervisor    Oneal Grout, MD Internal Medicine [facility] [address] [place], [place] [address] Cell Phone (Monday-Friday 8 am - 5 pm): [phone] On Call: [phone] and follow prompts after 5 pm and on weekends Office Phone: [phone] Office Fax: [phone]

## 2015-03-14 LAB — CBC AND DIFFERENTIAL
HEMATOCRIT: 33 % — AB (ref 36–46)
HEMOGLOBIN: 10.2 g/dL — AB (ref 12.0–16.0)
Neutrophils Absolute: 10 /uL
Platelets: 374 10*3/uL (ref 150–399)
WBC: 15.1 10^3/mL

## 2015-03-14 LAB — BASIC METABOLIC PANEL
BUN: 14 mg/dL (ref 4–21)
CREATININE: 0.7 mg/dL (ref 0.5–1.1)
Glucose: 107 mg/dL
Potassium: 4.4 mmol/L (ref 3.4–5.3)
Sodium: 139 mmol/L (ref 137–147)

## 2015-03-14 LAB — HEPATIC FUNCTION PANEL
ALK PHOS: 111 U/L (ref 25–125)
ALT: 37 U/L — AB (ref 7–35)
AST: 22 U/L (ref 13–35)
Bilirubin, Total: 0.3 mg/dL

## 2015-03-16 ENCOUNTER — Encounter: Payer: Self-pay | Admitting: Adult Health

## 2015-03-16 ENCOUNTER — Non-Acute Institutional Stay (SKILLED_NURSING_FACILITY): Payer: Medicare HMO | Admitting: Adult Health

## 2015-03-16 DIAGNOSIS — D72829 Elevated white blood cell count, unspecified: Secondary | ICD-10-CM

## 2015-03-16 DIAGNOSIS — K047 Periapical abscess without sinus: Secondary | ICD-10-CM | POA: Diagnosis not present

## 2015-03-16 NOTE — Progress Notes (Signed)
Patient ID: Judith Hardy, female   DOB: 01/21/1970, 45 y.o.   MRN: [id number]    DATE:  03/16/2015   MRN:  [id number]  BIRTHDAY: 05/30/1970  Facility:  Nursing Home Location:  [facility]  Nursing Home Room Number: 705-P  LEVEL OF CARE:  SNF (31)  Contact Information    Name Relation Home Work Mobile   PocahontasJames,Robert Spouse   [phone]   Omar PersonMason,Mary Aunt   [phone]   Mason,Peter Uncle [phone]         Code Status History    Date Active Date Inactive Code Status Order ID Comments User Context   12/24/2014 12:41 PM 12/26/2014  5:04 PM Full Code [id number]  Leroy SeaPrashant K Singh, MD Inpatient   05/27/2014  6:48 PM 06/01/2014  3:26 PM Full Code [id number]  Glenna FellowsBenjamin Hoxworth, MD Inpatient   12/19/2011 12:37 PM 12/19/2011  7:33 PM Full Code [id number]  Dorthula Matasiffany G Greene, PA ED       Chief Complaint  Patient presents with  . Acute Visit    Tooth infection    HISTORY OF PRESENT ILLNESS:  This is a 45 year old female who was noted to have wbc 15.1, elevated. She complains of tooth pain. Noted  left lower pre-molar to be decayed and chipped off. Noted gum area to be tender and pale. Patient reported that it was chipped 2 days ago.  [facility] She has PMH of anxiety, depression, arthritis, Sciatica and chronic lower back pain. She has Osteoarthritis of left hip for which she had left total hip arthroplasty anterior approach on 03/08/15. She has been admitted for a short-term rehabilitation.   PAST MEDICAL HISTORY:  Past Medical History  Diagnosis Date  . Anxiety   . Depression   . Arthritis   . Osteoarthritis   . Sciatica   . Pneumonia 12/16  . Chronic lower back pain      CURRENT MEDICATIONS: Reviewed  Patient's Medications  New Prescriptions   No medications on file  Previous Medications   AMOXICILLIN-CLAVULANATE (AUGMENTIN) 875-125 MG TABLET    Take 1 tablet by mouth 2 (two) times daily. For  seven (7) days.   ASPIRIN EC 325 MG EC TABLET    Take 1 tablet (325 mg total) by mouth 2 (two) times daily after a meal.   BENZOCAINE (ORAJEL) 10 % MUCOSAL GEL    Use as directed 1 application in the mouth or throat 2 (two) times daily as needed for mouth pain.   CLONAZEPAM (KLONOPIN) 1 MG TABLET    Take 1 mg by mouth 2 (two) times daily.   DOCUSATE SODIUM (COLACE) 100 MG CAPSULE    Take 100 mg by mouth 2 (two) times daily.   GABAPENTIN (NEURONTIN) 100 MG CAPSULE    Take 100 mg by mouth 3 (three) times daily.   GABAPENTIN (NEURONTIN) 300 MG CAPSULE    Take 600 mg by mouth at bedtime. Take two (2) 300 mg tablets to = 600 mg qHS   HYDROMORPHONE (DILAUDID) 4 MG TABLET    Take by mouth every 3 (three) hours. Take 1/2 tablet po q3h prn for moderate pain, take 1 tab q3h for severe pain.  Hold for sedation.   METHOCARBAMOL (ROBAXIN) 500 MG TABLET    Take 1 tablet (500 mg total) by mouth every 6 (six) hours as needed for muscle spasms.   OMEGA 3 1000 MG CAPS    Take 2 capsules by  mouth daily.   POLYETHYLENE GLYCOL (MIRALAX / GLYCOLAX) PACKET    Take 17 g by mouth daily.   PROPRANOLOL (INDERAL) 10 MG TABLET    Take 10 mg by mouth 2 (two) times daily.   SACCHAROMYCES BOULARDII (FLORASTOR) 250 MG CAPSULE    Take 250 mg by mouth 2 (two) times daily. For ten (10) days   TRAZODONE (DESYREL) 100 MG TABLET    Take 250 mg by mouth at bedtime as needed for sleep. Take 2.5 tablets to = 250 mg QHS PRN   VENLAFAXINE XR (EFFEXOR-XR) 150 MG 24 HR CAPSULE    Take 300 mg by mouth daily.  Modified Medications   No medications on file  Discontinued Medications   HYDROMORPHONE (DILAUDID) 4 MG TABLET    Take 1/2 to one tablet by mouth every 4 hours as needed for moderate to severe pain     Allergies  Allergen Reactions  . Acetaminophen Other (See Comments)    Headache  . Other Other (See Comments)    NO STEROIDS - causes white blood cell count to go up  . Tramadol Other (See Comments)    headaches  . Hydrocodone  Itching     REVIEW OF SYSTEMS:  GENERAL: no change in appetite, no fatigue, no weight changes, no fever, chills or weakness EYES: Denies change in vision, dry eyes, eye pain, itching or discharge EARS: Denies change in hearing, ringing in ears, or earache NOSE: Denies nasal congestion or epistaxis MOUTH and THROAT: Denies oral discomfort, gingival pain or bleeding, pain from teeth or hoarseness   RESPIRATORY: no cough, SOB, DOE, wheezing, hemoptysis CARDIAC: no chest pain, edema or palpitations GI: no abdominal pain, diarrhea, constipation, heart burn, nausea or vomiting GU: Denies dysuria, frequency, hematuria, incontinence, or discharge PSYCHIATRIC: Denies feeling of depression or anxiety. No report of hallucinations, insomnia, paranoia, or agitation    PHYSICAL EXAMINATION  GENERAL APPEARANCE: Well nourished. In no acute distress. Normal body habitus SKIN:  Left hip surgical wound is covered with aquacel dressing, no erythema on the sides, dry HEAD: Normal in size and contour. No evidence of trauma EYES: Lids open and close normally. No blepharitis, entropion or ectropion. PERRL. Conjunctivae are clear and sclerae are white. Lenses are without opacity EARS: Pinnae are normal. Patient hears normal voice tunes of the examiner MOUTH and THROAT: Lips are without lesions. Oral mucosa is moist and without lesions. Tongue is normal in shape, size, and color and without lesions NECK: supple, trachea midline, no neck masses, no thyroid tenderness, no thyromegaly LYMPHATICS: no LAN in the neck, no supraclavicular LAN RESPIRATORY: breathing is even & unlabored, BS CTAB CARDIAC: RRR, no murmur,no extra heart sounds, no edema GI: abdomen soft, normal BS, no masses, no tenderness, no hepatomegaly, no splenomegaly EXTREMITIES:  Able to move X 4 extremities PSYCHIATRIC: Alert and oriented X 3. Affect and behavior are appropriate  LABS/RADIOLOGY: Labs reviewed: Basic Metabolic Panel:  Recent  Labs  10/15/14 0921 12/24/14 0858 02/28/15 0845 03/14/15  NA 137 140 138 139  K 3.9 3.8 3.9 4.4  CL 103 104 104  --   CO2 --   GLUCOSE 103* 116* 100*  --   BUN CREATININE 0.82 0.93 0.85 0.7  CALCIUM 10.2 9.1 9.6  --    Liver Function Tests:  Recent Labs  06/25/14 1200 10/15/14 0921 12/24/14 0858 03/14/15  AST 19 13* 23 22  ALT 37*  ALKPHOS 119 92  89 111  BILITOT 0.4 0.5 0.5  --   PROT 8.3* 8.5* 7.5  --   ALBUMIN 4.0 4.1 3.7  --     Recent Labs  06/25/14 1200 10/15/14 0921 12/24/14 0858  LIPASE 13* 19* 24   CBC:  Recent Labs  12/24/14 0858 12/25/14 0440 02/28/15 0845 03/14/15  WBC 12.2* 11.7* 11.6* 15.1  NEUTROABS 7.9*  --  8.6* 10  HGB 12.6 12.0 13.4 10.2*  HCT 38.1 36.4 41.5 33*  MCV 98.4 98.9 99.5  --   PLT 504* 462* 440* 374   CBG:  Recent Labs  06/25/14 1152  GLUCAP 106*      Dg Chest 2 View  02/28/2015  CLINICAL DATA:  Preop total hip replacement EXAM: CHEST  2 VIEW COMPARISON:  12/24/2014 FINDINGS: Interval resolution of the previously seen right upper lobe pneumonia. Lungs are clear. No effusions. Heart is normal size. No acute bony abnormality. IMPRESSION: Resolved right upper lobe pneumonia.  No active disease. Electronically Signed   By: Charlett Nose M.D.   On: 02/28/2015 08:58   Dg Hip Operative Unilat W Or W/o Pelvis Left  03/08/2015  CLINICAL DATA:  46 year old undergoing left total hip arthroplasty from anterior approach for osteoarthritis. EXAM: OPERATIVE LEFT HIP (WITH PELVIS IF PERFORMED) 1 VIEW TECHNIQUE: Fluoroscopic spot image(s) were submitted for interpretation post-operatively. COMPARISON:  Bone window images from CT abdomen and pelvis 10/15/2014. FINDINGS: Two spot images, AP views of the left hip, demonstrate anatomic alignment of the left hip prosthesis. No acute complicating features. The radiologic technologist documented 42 sec of fluoroscopy time. IMPRESSION: Anatomic alignment of the left  total hip arthroplasty in the AP projection without complicating features. Electronically Signed   By: Hulan Saas M.D.   On: 03/08/2015 09:51    ASSESSMENT/PLAN:  Tooth Infection - start amoxicillin-clavulanate 875 mg 1 tab PO Q 12 hours X 7 days and Florastor 250 mg 1 capsule PO BID X 10 days  Leukocytosis - wbc 15.1; due to tooth infection; CBC in 1 week     [facility], NP [facility] [phone]

## 2015-03-18 ENCOUNTER — Encounter: Payer: Self-pay | Admitting: Adult Health

## 2015-03-18 ENCOUNTER — Non-Acute Institutional Stay (SKILLED_NURSING_FACILITY): Payer: Medicare HMO | Admitting: Adult Health

## 2015-03-18 DIAGNOSIS — D72829 Elevated white blood cell count, unspecified: Secondary | ICD-10-CM | POA: Diagnosis not present

## 2015-03-18 DIAGNOSIS — F411 Generalized anxiety disorder: Secondary | ICD-10-CM

## 2015-03-18 DIAGNOSIS — F32A Depression, unspecified: Secondary | ICD-10-CM

## 2015-03-18 DIAGNOSIS — K047 Periapical abscess without sinus: Secondary | ICD-10-CM | POA: Diagnosis not present

## 2015-03-18 DIAGNOSIS — F329 Major depressive disorder, single episode, unspecified: Secondary | ICD-10-CM

## 2015-03-18 DIAGNOSIS — M1612 Unilateral primary osteoarthritis, left hip: Secondary | ICD-10-CM

## 2015-03-18 DIAGNOSIS — I10 Essential (primary) hypertension: Secondary | ICD-10-CM

## 2015-03-18 DIAGNOSIS — M5126 Other intervertebral disc displacement, lumbar region: Secondary | ICD-10-CM

## 2015-03-18 DIAGNOSIS — M792 Neuralgia and neuritis, unspecified: Secondary | ICD-10-CM

## 2015-03-18 DIAGNOSIS — K5900 Constipation, unspecified: Secondary | ICD-10-CM | POA: Diagnosis not present

## 2015-03-18 NOTE — Progress Notes (Signed)
Patient ID: Judith Hardy, female   DOB: 08/13/1970, 45 y.o.   MRN: [id number]    DATE:  03/18/15  MRN:  [id number]  BIRTHDAY: 09/27/1970  Facility:  Nursing Home Location:  [facility]  Nursing Home Room Number: 705-P  LEVEL OF CARE:  SNF (31)  Contact Information    Name Relation Home Work Mobile   RuthtonJames,Robert Spouse   [phone]   Omar PersonMason,Mary Aunt   [phone]   Mason,Peter Uncle [phone]         Code Status History    Date Active Date Inactive Code Status Order ID Comments User Context   12/24/2014 12:41 PM 12/26/2014  5:04 PM Full Code [id number]  Leroy SeaPrashant K Singh, MD Inpatient   05/27/2014  6:48 PM 06/01/2014  3:26 PM Full Code [id number]  Glenna FellowsBenjamin Hoxworth, MD Inpatient   12/19/2011 12:37 PM 12/19/2011  7:33 PM Full Code [id number]  Dorthula Matasiffany G Greene, PA ED       Chief Complaint  Patient presents with  . Discharge Note    HISTORY OF PRESENT ILLNESS:  This is a 45 year old female who is for discharge home with Home health PT, OT and CNA. DME:  Rolling walker. She was recently started on antibiotic for tooth infection.  [facility]. She has PMH of anxiety, depression, arthritis, Sciatica and chronic lower back pain. She has Osteoarthritis of left hip for which she had left total hip arthroplasty anterior approach on 03/08/15.   Patient was admitted to this facility for short-term rehabilitation after the patient's recent hospitalization.  Patient has completed SNF rehabilitation and therapy has cleared the patient for discharge.   PAST MEDICAL HISTORY:  Past Medical History  Diagnosis Date  . Anxiety   . Depression   . Arthritis   . Osteoarthritis   . Sciatica   . Pneumonia 12/16  . Chronic lower back pain      CURRENT MEDICATIONS: Reviewed  Patient's Medications  New Prescriptions   No medications on file  Previous Medications   AMOXICILLIN-CLAVULANATE (AUGMENTIN) 875-125  MG TABLET    Take 1 tablet by mouth 2 (two) times daily. For seven (7) days.   ASPIRIN EC 325 MG EC TABLET    Take 1 tablet (325 mg total) by mouth 2 (two) times daily after a meal.   BENZOCAINE (ORAJEL) 10 % MUCOSAL GEL    Use as directed 1 application in the mouth or throat 2 (two) times daily as needed for mouth pain.   CLONAZEPAM (KLONOPIN) 1 MG TABLET    Take 1 mg by mouth 2 (two) times daily.   DOCUSATE SODIUM (COLACE) 100 MG CAPSULE    Take 100 mg by mouth 2 (two) times daily.   GABAPENTIN (NEURONTIN) 100 MG CAPSULE    Take 100 mg by mouth 3 (three) times daily.   GABAPENTIN (NEURONTIN) 300 MG CAPSULE    Take 600 mg by mouth at bedtime. Take two (2) 300 mg tablets to = 600 mg qHS   HYDROMORPHONE (DILAUDID) 4 MG TABLET    Take by mouth every 3 (three) hours. Take 1/2 tablet po q3h prn for moderate pain, take 1 tab q3h for severe pain.  Hold for sedation.   METHOCARBAMOL (ROBAXIN) 500 MG TABLET    Take 1 tablet (500 mg total) by mouth every 6 (six) hours as needed for muscle spasms.   OMEGA 3 1000 MG CAPS    Take 2 capsules by  mouth daily.   POLYETHYLENE GLYCOL (MIRALAX / GLYCOLAX) PACKET    Take 17 g by mouth daily.   PROPRANOLOL (INDERAL) 10 MG TABLET    Take 10 mg by mouth 2 (two) times daily.   SACCHAROMYCES BOULARDII (FLORASTOR) 250 MG CAPSULE    Take 250 mg by mouth 2 (two) times daily. For ten (10) days   TRAZODONE (DESYREL) 100 MG TABLET    Take 250 mg by mouth at bedtime as needed for sleep. Take 2.5 tablets to = 250 mg QHS PRN   VENLAFAXINE XR (EFFEXOR-XR) 150 MG 24 HR CAPSULE    Take 300 mg by mouth daily.  Modified Medications   No medications on file  Discontinued Medications   No medications on file     Allergies  Allergen Reactions  . Acetaminophen Other (See Comments)    Headache  . Other Other (See Comments)    NO STEROIDS - causes white blood cell count to go up  . Tramadol Other (See Comments)    headaches  . Hydrocodone Itching     REVIEW OF  SYSTEMS:  GENERAL: no change in appetite, no fatigue, no weight changes, no fever, chills or weakness EYES: Denies change in vision, dry eyes, eye pain, itching or discharge EARS: Denies change in hearing, ringing in ears, or earache NOSE: Denies nasal congestion or epistaxis MOUTH and THROAT: Denies oral discomfort, gingival pain or bleeding, pain from teeth or hoarseness   RESPIRATORY: no cough, SOB, DOE, wheezing, hemoptysis CARDIAC: no chest pain, edema or palpitations GI: no abdominal pain, diarrhea, constipation, heart burn, nausea or vomiting GU: Denies dysuria, frequency, hematuria, incontinence, or discharge PSYCHIATRIC: Denies feeling of depression or anxiety. No report of hallucinations, insomnia, paranoia, or agitation    PHYSICAL EXAMINATION  GENERAL APPEARANCE: Well nourished. In no acute distress. Normal body habitus SKIN:  Left hip surgical wound is covered with aquacel dressing, no erythema on the sides, dry HEAD: Normal in size and contour. No evidence of trauma EYES: Lids open and close normally. No blepharitis, entropion or ectropion. PERRL. Conjunctivae are clear and sclerae are white. Lenses are without opacity EARS: Pinnae are normal. Patient hears normal voice tunes of the examiner MOUTH and THROAT: Lips are without lesions. Oral mucosa is moist and without lesions. Tongue is normal in shape, size, and color and without lesions NECK: supple, trachea midline, no neck masses, no thyroid tenderness, no thyromegaly LYMPHATICS: no LAN in the neck, no supraclavicular LAN RESPIRATORY: breathing is even & unlabored, BS CTAB CARDIAC: RRR, no murmur,no extra heart sounds, no edema GI: abdomen soft, normal BS, no masses, no tenderness, no hepatomegaly, no splenomegaly EXTREMITIES:  Able to move X 4 extremities PSYCHIATRIC: Alert and oriented X 3. Affect and behavior are appropriate  LABS/RADIOLOGY: Labs reviewed: Basic Metabolic Panel:  Recent Labs  16/10/96 0921  12/24/14 0858 02/28/15 0845 03/14/15  NA 137 140 138 139  K 3.9 3.8 3.9 4.4  CL 103 104 104  --   CO2 --   GLUCOSE 103* 116* 100*  --   BUN CREATININE 0.82 0.93 0.85 0.7  CALCIUM 10.2 9.1 9.6  --    Liver Function Tests:  Recent Labs  06/25/14 1200 10/15/14 0921 12/24/14 0858 03/14/15  AST 19 13* 23 22  ALT 37*  ALKPHOS 119 92 89 111  BILITOT 0.4 0.5 0.5  --   PROT 8.3* 8.5* 7.5  --   ALBUMIN 4.0  4.1 3.7  --     Recent Labs  06/25/14 1200 10/15/14 0921 12/24/14 0858  LIPASE 13* 19* 24   CBC:  Recent Labs  12/24/14 0858 12/25/14 0440 02/28/15 0845 03/14/15  WBC 12.2* 11.7* 11.6* 15.1  NEUTROABS 7.9*  --  8.6* 10  HGB 12.6 12.0 13.4 10.2*  HCT 38.1 36.4 41.5 33*  MCV 98.4 98.9 99.5  --   PLT 504* 462* 440* 374   CBG:  Recent Labs  06/25/14 1152  GLUCAP 106*      Dg Chest 2 View  02/28/2015  CLINICAL DATA:  Preop total hip replacement EXAM: CHEST  2 VIEW COMPARISON:  12/24/2014 FINDINGS: Interval resolution of the previously seen right upper lobe pneumonia. Lungs are clear. No effusions. Heart is normal size. No acute bony abnormality. IMPRESSION: Resolved right upper lobe pneumonia.  No active disease. Electronically Signed   By: Charlett Nose M.D.   On: 02/28/2015 08:58   Dg Hip Operative Unilat W Or W/o Pelvis Left  03/08/2015  CLINICAL DATA:  45 year old undergoing left total hip arthroplasty from anterior approach for osteoarthritis. EXAM: OPERATIVE LEFT HIP (WITH PELVIS IF PERFORMED) 1 VIEW TECHNIQUE: Fluoroscopic spot image(s) were submitted for interpretation post-operatively. COMPARISON:  Bone window images from CT abdomen and pelvis 10/15/2014. FINDINGS: Two spot images, AP views of the left hip, demonstrate anatomic alignment of the left hip prosthesis. No acute complicating features. The radiologic technologist documented 42 sec of fluoroscopy time. IMPRESSION: Anatomic alignment of the left total hip arthroplasty  in the AP projection without complicating features. Electronically Signed   By: Hulan Saas M.D.   On: 03/08/2015 09:51    ASSESSMENT/PLAN:  Osteoarthritis S/P Left hip arthroplasty - for Home health PT, OT and CNA; continue Robaxin 500 mg 1 tab PO Q 6 hours PRN for muscle spasm; ASA EC 325 mg 1 tab BID for DVT prophylaxis; Dilaudid 4 mg 1/2 - 1 tab Q 3 hours PRN  Constipation - continue Miralax PRN  Insomnia -  Continue Trazodone 100 mg take 2 1/2 tab = 250 mg PO Q HS PRN, Colace 100 mg BID  Anxiety - mood is stable; continue Klonopin 1 mg 1 tab PO BID  Hypertension - well-controlled; continue Propanolol 10 mg 1 TAB bid  Neuropathy - stable; continue Gabapentin 100 mg TID and 300 mg 1 capsule Q HS  Depression - continue  Effexor XR 150 mg take 2 tabs = 300 mg Q HS  Tooth Infection - continue amoxicillin-clavulanate 875 mg 1 tab PO Q 12 hours to complete 7 days and Florastor 250 mg 1 capsule PO BID to complete10 days  Leukocytosis - wbc 15.1; due to tooth infection; repeat CBC   Herniated nucleus pulposus - continue Dilaudid PRN and follow-up @ Pain Clinic    I have filled out patient's discharge paperwork and written prescriptions.  Patient will receive home health PT, OT and CNA.  DME provided:  Rolling walker  Total discharge time: Greater than 30 minutes  Discharge time involved coordination of the discharge process with Child psychotherapist, nursing staff and therapy department. Medical justification for home health services/DME verified.     [facility], NP [employer] [phone]

## 2015-04-15 ENCOUNTER — Encounter (HOSPITAL_COMMUNITY): Payer: Self-pay | Admitting: Emergency Medicine

## 2015-04-15 ENCOUNTER — Emergency Department (HOSPITAL_COMMUNITY): Payer: Medicare HMO

## 2015-04-15 ENCOUNTER — Emergency Department (HOSPITAL_COMMUNITY)
Admission: EM | Admit: 2015-04-15 | Discharge: 2015-04-15 | Disposition: A | Discharge status: Home / Self Care | Payer: Medicare HMO | Attending: Emergency Medicine | Admitting: Emergency Medicine

## 2015-04-15 DIAGNOSIS — M5430 Sciatica, unspecified side: Secondary | ICD-10-CM | POA: Insufficient documentation

## 2015-04-15 DIAGNOSIS — Z8701 Personal history of pneumonia (recurrent): Secondary | ICD-10-CM | POA: Diagnosis not present

## 2015-04-15 DIAGNOSIS — W108XXA Fall (on) (from) other stairs and steps, initial encounter: Secondary | ICD-10-CM | POA: Diagnosis not present

## 2015-04-15 DIAGNOSIS — M1990 Unspecified osteoarthritis, unspecified site: Secondary | ICD-10-CM | POA: Insufficient documentation

## 2015-04-15 DIAGNOSIS — Y9289 Other specified places as the place of occurrence of the external cause: Secondary | ICD-10-CM | POA: Diagnosis not present

## 2015-04-15 DIAGNOSIS — Z9889 Other specified postprocedural states: Secondary | ICD-10-CM | POA: Diagnosis not present

## 2015-04-15 DIAGNOSIS — F419 Anxiety disorder, unspecified: Secondary | ICD-10-CM | POA: Insufficient documentation

## 2015-04-15 DIAGNOSIS — Y998 Other external cause status: Secondary | ICD-10-CM | POA: Diagnosis not present

## 2015-04-15 DIAGNOSIS — Z79899 Other long term (current) drug therapy: Secondary | ICD-10-CM | POA: Insufficient documentation

## 2015-04-15 DIAGNOSIS — S7002XA Contusion of left hip, initial encounter: Secondary | ICD-10-CM | POA: Diagnosis not present

## 2015-04-15 DIAGNOSIS — Z7982 Long term (current) use of aspirin: Secondary | ICD-10-CM | POA: Diagnosis not present

## 2015-04-15 DIAGNOSIS — F329 Major depressive disorder, single episode, unspecified: Secondary | ICD-10-CM | POA: Diagnosis not present

## 2015-04-15 DIAGNOSIS — G8929 Other chronic pain: Secondary | ICD-10-CM | POA: Insufficient documentation

## 2015-04-15 DIAGNOSIS — Y9301 Activity, walking, marching and hiking: Secondary | ICD-10-CM | POA: Diagnosis not present

## 2015-04-15 DIAGNOSIS — S79912A Unspecified injury of left hip, initial encounter: Secondary | ICD-10-CM | POA: Diagnosis present

## 2015-04-15 DIAGNOSIS — M25552 Pain in left hip: Secondary | ICD-10-CM

## 2015-04-15 MED ORDER — HYDROMORPHONE HCL 2 MG PO TABS
4.0000 mg | ORAL_TABLET | Freq: Once | ORAL | Status: AC
  Administered 2015-04-15: 4 mg via ORAL
  Filled 2015-04-15: qty 2

## 2015-04-15 NOTE — ED Notes (Signed)
Patient transported to X-ray 

## 2015-04-15 NOTE — ED Notes (Signed)
Pt with Hx total left hip replacement in February fell 4 days ago onto her knees, has had progressive left hip pain since. Motor function, sensation intact. 2+ pedal pulses. Ambulatory.

## 2015-04-15 NOTE — ED Provider Notes (Signed)
CSN: [id number]     Arrival date & time 04/15/15  0740 History   First MD Initiated Contact with Patient 04/15/15 0745     Chief Complaint  Patient presents with  . Hip Pain     (Consider location/radiation/quality/duration/timing/severity/associated sxs/prior Treatment) HPI Comments: Larey SeatFell down onto both knees while walking down stairs. Has had worsening left hip pain since then.   Patient is a 45 y.o. female presenting with fall. The history is provided by the patient.  Fall This is a new problem. Episode onset: 4 days ago. The problem occurs constantly. The problem has not changed since onset.Pertinent negatives include no abdominal pain. Nothing aggravates the symptoms. Nothing relieves the symptoms. She has tried nothing for the symptoms.    Past Medical History  Diagnosis Date  . Anxiety   . Depression   . Arthritis   . Osteoarthritis   . Sciatica   . Pneumonia 12/16  . Chronic lower back pain    Past Surgical History  Procedure Laterality Date  . Appendectomy  2011  . Lumbar laminectomy/decompression microdiscectomy  01/10/2011    Procedure: LUMBAR LAMINECTOMY/DECOMPRESSION MICRODISCECTOMY;  Surgeon: [name];  Location: [facility]S;  Service: Orthopedics;  Laterality: N/A;  Decompression L4 - L5  (X-Ray)  . Lumbar laminectomy/decompression microdiscectomy  08/02/2011    Procedure: LUMBAR LAMINECTOMY/DECOMPRESSION MICRODISCECTOMY;  Surgeon: [name], MD;  Location: [facility];  Service: Orthopedics;  Laterality: N/A;  Re-do Decompression L4-L5  . Back surgery    . Total hip arthroplasty Left 03/08/2015    ANTERIOR APPROACH  . Joint replacement    . Hernia repair  4/16    umbilical  . Total hip arthroplasty Left 03/08/2015    Procedure: TOTAL HIP ARTHROPLASTY ANTERIOR APPROACH;  Surgeon: [name], MD;  Location: [facility] OR;  Service: Orthopedics;  Laterality: Left;   Family History  Problem Relation Age of Onset  . Heart failure Mother    Social History  Substance  Use Topics  . Smoking status: Never Smoker   . Smokeless tobacco: Never Used     Comment: tried smoking 3 days 2012  . Alcohol Use: No   OB History    No data available     Review of Systems  Gastrointestinal: Negative for abdominal pain.  All other systems reviewed and are negative.     Allergies  Acetaminophen; Other; Tramadol; and Hydrocodone  Home Medications   Prior to Admission medications   Medication Sig Start Date End Date Taking? Authorizing Provider  amoxicillin-clavulanate (AUGMENTIN) 875-125 MG tablet Take 1 tablet by mouth 2 (two) times daily. For seven (7) days.    Historical Provider, MD  aspirin EC 325 MG EC tablet Take 1 tablet (325 mg total) by mouth 2 (two) times daily after a meal. 03/10/15   Elodia FlorenceAndrew Nida, PA-C  benzocaine (ORAJEL) 10 % mucosal gel Use as directed 1 application in the mouth or throat 2 (two) times daily as needed for mouth pain.    Historical Provider, MD  clonazePAM (KLONOPIN) 1 MG tablet Take 1 mg by mouth 2 (two) times daily.    Historical Provider, MD  docusate sodium (COLACE) 100 MG capsule Take 100 mg by mouth 2 (two) times daily.    Historical Provider, MD  gabapentin (NEURONTIN) 100 MG capsule Take 100 mg by mouth 3 (three) times daily.    Historical Provider, MD  gabapentin (NEURONTIN) 300 MG capsule Take 600 mg by mouth at bedtime. Take two (2) 300 mg tablets to =  600 mg qHS    Historical Provider, MD  HYDROmorphone (DILAUDID) 4 MG tablet Take by mouth every 3 (three) hours. Take 1/2 tablet po q3h prn for moderate pain, take 1 tab q3h for severe pain.  Hold for sedation.    Historical Provider, MD  methocarbamol (ROBAXIN) 500 MG tablet Take 1 tablet (500 mg total) by mouth every 6 (six) hours as needed for muscle spasms. 03/10/15   [name], PA-C  Omega 3 1000 MG CAPS Take 2 capsules by mouth daily.    Historical Provider, MD  polyethylene glycol (MIRALAX / GLYCOLAX) packet Take 17 g by mouth daily.    Historical Provider, MD    propranolol (INDERAL) 10 MG tablet Take 10 mg by mouth 2 (two) times daily.    Historical Provider, MD  saccharomyces boulardii (FLORASTOR) 250 MG capsule Take 250 mg by mouth 2 (two) times daily. For ten (10) days    Historical Provider, MD  traZODone (DESYREL) 100 MG tablet Take 250 mg by mouth at bedtime as needed for sleep. Take 2.5 tablets to = 250 mg QHS PRN    Historical Provider, MD  venlafaxine XR (EFFEXOR-XR) 150 MG 24 hr capsule Take 300 mg by mouth daily. 09/21/14   Historical Provider, MD   BP 131/93 mmHg  Pulse 93  Temp(Src) 97.8 F (36.6 C) (Oral)  Resp 16  SpO2 98% Physical Exam  Constitutional: She is oriented to person, place, and time. She appears well-developed and well-nourished. No distress.  HENT:  Head: Normocephalic.  Eyes: Conjunctivae are normal.  Neck: Neck supple. No tracheal deviation present.  Cardiovascular: Normal rate and regular rhythm.   Pulmonary/Chest: Effort normal. No respiratory distress.  Abdominal: Soft. She exhibits no distension.  Musculoskeletal:       Right hip: She exhibits normal range of motion, no tenderness and no deformity.       Left hip: She exhibits decreased range of motion (2/2 pain) and tenderness (laterally and anteriorly at pelvis). She exhibits no swelling and no deformity.       Right knee: She exhibits no swelling and no deformity. No tenderness found.       Left knee: She exhibits no swelling and no deformity. No tenderness found.       Legs: Neurological: She is alert and oriented to person, place, and time.  Skin: Skin is warm and dry.  Psychiatric: She has a normal mood and affect.    ED Course  Procedures (including critical care time) Labs Review Labs Reviewed - No data to display  Imaging Review Dg Hip Unilat With Pelvis 2-3 Views Left  04/15/2015  CLINICAL DATA:  Left hip pain after fall.  Initial encounter. EXAM: DG HIP (WITH OR WITHOUT PELVIS) 2-3V LEFT COMPARISON:  None. FINDINGS: Status post left total  hip arthroplasty. The femoral and acetabular components appear to be well situated. No fracture or dislocation is noted. IMPRESSION: No acute abnormality seen in the left hip. Status post left total hip arthroplasty. Electronically Signed   By: Lupita Raider, M.D.   On: 04/15/2015 08:26   I have personally reviewed and evaluated these images and lab results as part of my medical decision-making.   EKG Interpretation None      MDM   Final diagnoses:  Left hip pain  Contusion of left hip, initial encounter    45 y.o. female presents with Low energy fall from standing that occurred 4 days ago walking on stairs. The patient has continued to be  ambulatory with her walker at baseline but has had more of a shuffling gait with her left leg and has worsening progressive pain with ambulation on that side. She is seen by pain management in [place] and has an appointment scheduled with him next week. She takes Dilaudid 4 mg tablets every 3 hours as needed for pain and her last prescription was filled 9 days ago for a 10 day duration. She is provided a dose of her home pain medications here, screening x-ray shows no acute fracture or dislocation and no prosthetic complication. At this time no further emergent workup is necessary. I recommended the patient follow up with her pain management clinic to discuss medication dosage changes and receive further outpatient prescriptions. Plan to follow up with PCP as needed and return precautions discussed for worsening or new concerning symptoms.    Lyndal Pulley, MD 04/15/15 [phone]

## 2015-04-15 NOTE — Discharge Instructions (Signed)

## 2015-05-20 ENCOUNTER — Encounter (HOSPITAL_COMMUNITY): Payer: Self-pay | Admitting: Emergency Medicine

## 2015-05-20 ENCOUNTER — Emergency Department (HOSPITAL_COMMUNITY)
Admission: EM | Admit: 2015-05-20 | Discharge: 2015-05-20 | Disposition: A | Discharge status: Home / Self Care | Payer: Medicare HMO | Attending: Emergency Medicine | Admitting: Emergency Medicine

## 2015-05-20 DIAGNOSIS — F329 Major depressive disorder, single episode, unspecified: Secondary | ICD-10-CM | POA: Insufficient documentation

## 2015-05-20 DIAGNOSIS — Z3202 Encounter for pregnancy test, result negative: Secondary | ICD-10-CM | POA: Diagnosis not present

## 2015-05-20 DIAGNOSIS — Z8739 Personal history of other diseases of the musculoskeletal system and connective tissue: Secondary | ICD-10-CM | POA: Insufficient documentation

## 2015-05-20 DIAGNOSIS — Z8701 Personal history of pneumonia (recurrent): Secondary | ICD-10-CM | POA: Insufficient documentation

## 2015-05-20 DIAGNOSIS — N390 Urinary tract infection, site not specified: Secondary | ICD-10-CM | POA: Diagnosis not present

## 2015-05-20 DIAGNOSIS — M545 Low back pain: Secondary | ICD-10-CM | POA: Diagnosis present

## 2015-05-20 DIAGNOSIS — Z79899 Other long term (current) drug therapy: Secondary | ICD-10-CM | POA: Diagnosis not present

## 2015-05-20 DIAGNOSIS — F419 Anxiety disorder, unspecified: Secondary | ICD-10-CM | POA: Diagnosis not present

## 2015-05-20 DIAGNOSIS — G8929 Other chronic pain: Secondary | ICD-10-CM | POA: Diagnosis not present

## 2015-05-20 LAB — I-STAT BETA HCG BLOOD, ED (MC, WL, AP ONLY)

## 2015-05-20 LAB — URINE MICROSCOPIC-ADD ON

## 2015-05-20 LAB — CBC WITH DIFFERENTIAL/PLATELET
Basophils Absolute: 0 10*3/uL (ref 0.0–0.1)
Basophils Relative: 0 %
EOS ABS: 0.2 10*3/uL (ref 0.0–0.7)
Eosinophils Relative: 1 %
HEMATOCRIT: 37.5 % (ref 36.0–46.0)
HEMOGLOBIN: 12 g/dL (ref 12.0–15.0)
LYMPHS ABS: 3.9 10*3/uL (ref 0.7–4.0)
Lymphocytes Relative: 32 %
MCH: 31.3 pg (ref 26.0–34.0)
MCHC: 32 g/dL (ref 30.0–36.0)
MCV: 97.9 fL (ref 78.0–100.0)
MONO ABS: 0.6 10*3/uL (ref 0.1–1.0)
MONOS PCT: 5 %
NEUTROS ABS: 7.6 10*3/uL (ref 1.7–7.7)
NEUTROS PCT: 62 %
Platelets: 390 10*3/uL (ref 150–400)
RBC: 3.83 MIL/uL — ABNORMAL LOW (ref 3.87–5.11)
RDW: 13.6 % (ref 11.5–15.5)
WBC: 12.2 10*3/uL — ABNORMAL HIGH (ref 4.0–10.5)

## 2015-05-20 LAB — COMPREHENSIVE METABOLIC PANEL
ALK PHOS: 99 U/L (ref 38–126)
ALT: 19 U/L (ref 14–54)
ANION GAP: 9 (ref 5–15)
AST: 18 U/L (ref 15–41)
Albumin: 3.5 g/dL (ref 3.5–5.0)
BILIRUBIN TOTAL: 0.6 mg/dL (ref 0.3–1.2)
BUN: 9 mg/dL (ref 6–20)
CALCIUM: 8.7 mg/dL — AB (ref 8.9–10.3)
CO2: 25 mmol/L (ref 22–32)
Chloride: 105 mmol/L (ref 101–111)
Creatinine, Ser: 0.82 mg/dL (ref 0.44–1.00)
GFR calc non Af Amer: >60 mL/min (ref >60)
Glucose, Bld: 117 mg/dL — ABNORMAL HIGH (ref 65–99)
Potassium: 3.9 mmol/L (ref 3.5–5.1)
Sodium: 139 mmol/L (ref 135–145)
TOTAL PROTEIN: 7 g/dL (ref 6.5–8.1)

## 2015-05-20 LAB — URINALYSIS, ROUTINE W REFLEX MICROSCOPIC
Bilirubin Urine: NEGATIVE
GLUCOSE, UA: NEGATIVE mg/dL
Ketones, ur: NEGATIVE mg/dL
Nitrite: NEGATIVE
PROTEIN: 30 mg/dL — AB
SPECIFIC GRAVITY, URINE: 1.019 (ref 1.005–1.030)
pH: 7 (ref 5.0–8.0)

## 2015-05-20 MED ORDER — ONDANSETRON 4 MG PO TBDP: ORAL_TABLET | ORAL | Status: DC

## 2015-05-20 MED ORDER — CEPHALEXIN 500 MG PO CAPS: 500.0000 mg | ORAL_CAPSULE | Freq: Four times a day (QID) | ORAL | Status: DC

## 2015-05-20 MED ORDER — HYDROMORPHONE HCL 1 MG/ML IJ SOLN
1.0000 mg | Freq: Once | INTRAMUSCULAR | Status: AC
  Administered 2015-05-20: 1 mg via INTRAVENOUS
  Filled 2015-05-20: qty 1

## 2015-05-20 MED ORDER — DEXTROSE 5 % IV SOLN
1.0000 g | Freq: Once | INTRAVENOUS | Status: AC
  Administered 2015-05-20: 1 g via INTRAVENOUS
  Filled 2015-05-20: qty 10

## 2015-05-20 MED ORDER — HYDROMORPHONE HCL 4 MG PO TABS: 4.0000 mg | ORAL_TABLET | Freq: Four times a day (QID) | ORAL | Status: DC | PRN

## 2015-05-20 MED ORDER — ONDANSETRON HCL 4 MG/2ML IJ SOLN
4.0000 mg | Freq: Once | INTRAMUSCULAR | Status: DC
  Filled 2015-05-20: qty 2

## 2015-05-20 NOTE — ED Notes (Signed)
Pt reports hematuria onset Monday clearing with at home use of cranberry extract; continued dysuria, flank pain, urine odor, and urine cloudiness.

## 2015-05-20 NOTE — ED Notes (Signed)
RN getting blood 

## 2015-05-20 NOTE — ED Notes (Signed)
Per pt, states lower back pain on and off since left hip replacement-states started urinating blood on Monday

## 2015-05-20 NOTE — Discharge Instructions (Signed)
Follow-up with your family doctor next week for recheck drink plenty of fluids ?

## 2015-05-20 NOTE — ED Provider Notes (Signed)
CSN: [id number]     Arrival date & time 05/20/15  [id number] History   First MD Initiated Contact with Patient 05/20/15 [phone]     Chief Complaint  Patient presents with  . Back Pain     (Consider location/radiation/quality/duration/timing/severity/associated sxs/prior Treatment) Patient is a 45 y.o. female presenting with back pain. The history is provided by the patient (Patient complains of low back pain worse on left flank).  Back Pain Location:  Generalized Quality:  Aching Radiates to:  Does not radiate Pain severity:  Moderate Pain is:  Same all the time Onset quality:  Sudden Timing:  Constant Progression:  Worsening Associated symptoms: no abdominal pain, no chest pain and no headaches     Past Medical History  Diagnosis Date  . Anxiety   . Depression   . Arthritis   . Osteoarthritis   . Sciatica   . Pneumonia 12/16  . Chronic lower back pain    Past Surgical History  Procedure Laterality Date  . Appendectomy  2011  . Lumbar laminectomy/decompression microdiscectomy  01/10/2011    Procedure: LUMBAR LAMINECTOMY/DECOMPRESSION MICRODISCECTOMY;  Surgeon: [name];  Location: [facility];  Service: Orthopedics;  Laterality: N/A;  Decompression L4 - L5  (X-Ray)  . Lumbar laminectomy/decompression microdiscectomy  08/02/2011    Procedure: LUMBAR LAMINECTOMY/DECOMPRESSION MICRODISCECTOMY;  Surgeon: [name], MD;  Location: [facility];  Service: Orthopedics;  Laterality: N/A;  Re-do Decompression L4-L5  . Back surgery    . Total hip arthroplasty Left 03/08/2015    ANTERIOR APPROACH  . Joint replacement    . Hernia repair  4/16    umbilical  . Total hip arthroplasty Left 03/08/2015    Procedure: TOTAL HIP ARTHROPLASTY ANTERIOR APPROACH;  Surgeon: [name], MD;  Location: [facility];  Service: Orthopedics;  Laterality: Left;   Family History  Problem Relation Age of Onset  . Heart failure Mother    Social History  Substance Use Topics  . Smoking status: Never Smoker   .  Smokeless tobacco: Never Used     Comment: tried smoking 3 days 2012  . Alcohol Use: No   OB History    No data available     Review of Systems  Constitutional: Negative for appetite change and fatigue.  HENT: Negative for congestion, ear discharge and sinus pressure.   Eyes: Negative for discharge.  Respiratory: Negative for cough.   Cardiovascular: Negative for chest pain.  Gastrointestinal: Negative for abdominal pain and diarrhea.  Genitourinary: Negative for frequency and hematuria.  Musculoskeletal: Positive for back pain.  Skin: Negative for rash.  Neurological: Negative for seizures and headaches.  Psychiatric/Behavioral: Negative for hallucinations.      Allergies  Acetaminophen; Other; Tramadol; and Hydrocodone  Home Medications   Prior to Admission medications   Medication Sig Start Date End Date Taking? Authorizing Provider  clonazePAM (KLONOPIN) 1 MG tablet Take 1 mg by mouth 2 (two) times daily.   Yes Historical Provider, MD  gabapentin (NEURONTIN) 300 MG capsule Take 600 mg by mouth at bedtime. Take two (2) 300 mg tablets to = 600 mg qHS   Yes Historical Provider, MD  methocarbamol (ROBAXIN) 750 MG tablet Take 750 mg by mouth 3 (three) times daily.   Yes Historical Provider, MD  traZODone (DESYREL) 150 MG tablet Take 225 mg by mouth at bedtime.  03/04/15  Yes Historical Provider, MD  venlafaxine XR (EFFEXOR-XR) 150 MG 24 hr capsule Take 300 mg by mouth daily. 09/21/14  Yes Historical Provider, MD  aspirin EC 325 MG EC tablet Take 1 tablet (325 mg total) by mouth 2 (two) times daily after a meal. Patient not taking: Reported on 05/20/2015 03/10/15   [name], PA-C  cephALEXin (KEFLEX) 500 MG capsule Take 1 capsule (500 mg total) by mouth 4 (four) times daily. 05/20/15   [name], MD  HYDROmorphone (DILAUDID) 4 MG tablet Take 1 tablet (4 mg total) by mouth every 6 (six) hours as needed for severe pain. 05/20/15   [name], MD  methocarbamol (ROBAXIN) 500 MG  tablet Take 1 tablet (500 mg total) by mouth every 6 (six) hours as needed for muscle spasms. Patient not taking: Reported on 05/20/2015 03/10/15   [name], PA-C  ondansetron (ZOFRAN ODT) 4 MG disintegrating tablet  ODT q4 hours prn nausea/vomit 05/20/15   [name], MD   BP 125/91 mmHg  Pulse 84  Temp(Src) 98.4 F (36.9 C) (Oral)  Resp 18  SpO2 98% Physical Exam  Constitutional: She is oriented to person, place, and time. She appears well-developed.  HENT:  Head: Normocephalic.  Eyes: Conjunctivae and EOM are normal. No scleral icterus.  Neck: Neck supple. No thyromegaly present.  Cardiovascular: Normal rate and regular rhythm.  Exam reveals no gallop and no friction rub.   No murmur heard. Pulmonary/Chest: No stridor. She has no wheezes. She has no rales. She exhibits no tenderness.  Abdominal: She exhibits no distension. There is no tenderness. There is no rebound.  Musculoskeletal: Normal range of motion. She exhibits no edema.  Moderate left flank tenderness  Lymphadenopathy:    She has no cervical adenopathy.  Neurological: She is oriented to person, place, and time. She exhibits normal muscle tone. Coordination normal.  Skin: No rash noted. No erythema.  Psychiatric: She has a normal mood and affect. Her behavior is normal.    ED Course  Procedures (including critical care time) Labs Review Labs Reviewed  URINALYSIS, ROUTINE W REFLEX MICROSCOPIC (NOT AT [facility]) - Abnormal; Notable for the following:    APPearance TURBID (*)    Hgb urine dipstick LARGE (*)    Protein, ur 30 (*)    Leukocytes, UA LARGE (*)    All other components within normal limits  CBC WITH DIFFERENTIAL/PLATELET - Abnormal; Notable for the following:    WBC 12.2 (*)    RBC 3.83 (*)    All other components within normal limits  COMPREHENSIVE METABOLIC PANEL - Abnormal; Notable for the following:    Glucose, Bld 117 (*)    Calcium 8.7 (*)    All other components within normal limits  URINE  MICROSCOPIC-ADD ON - Abnormal; Notable for the following:    Squamous Epithelial / LPF 6-30 (*)    Bacteria, UA MANY (*)    All other components within normal limits  URINE CULTURE  I-STAT BETA HCG BLOOD, ED ([facility], [facility], [facility] ONLY)    Imaging Review No results found. I have personally reviewed and evaluated these images and lab results as part of my medical decision-making.   EKG Interpretation None      MDM   Final diagnoses:  UTI (lower urinary tract infection)     Patient's labs are consistent with a urinary tract infection. She's been on Keflex and given nausea and pain medicine. She will follow-up next week with her primary doctor   [name], MD 05/20/15 1410

## 2015-05-22 LAB — URINE CULTURE: SPECIAL REQUESTS: NORMAL

## 2015-05-23 ENCOUNTER — Telehealth (HOSPITAL_BASED_OUTPATIENT_CLINIC_OR_DEPARTMENT_OTHER): Payer: Self-pay | Admitting: Emergency Medicine

## 2015-05-23 NOTE — Telephone Encounter (Signed)
Post ED Visit - Positive Culture Follow-up  Culture report reviewed by antimicrobial stewardship pharmacist:  []  Judith Hardy, Pharm.D. []  Judith Hardy, Pharm.D., BCPS []  Judith Hardy, Pharm.D. [x]  Judith Hardy, Pharm.D., BCPS []  Judith Hardy, [place] BCPS, AAHIVP []  Judith Hardy, Pharm.D., BCPS, AAHIVP []  Judith Hardy, Pharm.D. []  Judith Hardy, [place]arm[place]D.  Positive urine culture Treated with cephalexin, organism sensitive to the same and no further patient follow-up is required at this time.  Judith Hardy, Judith Hardy 05/23/2015, 11:40 AM

## 2015-05-28 ENCOUNTER — Emergency Department (HOSPITAL_COMMUNITY)
Admission: EM | Admit: 2015-05-28 | Discharge: 2015-05-28 | Disposition: A | Discharge status: Home / Self Care | Payer: Medicare HMO | Attending: Emergency Medicine | Admitting: Emergency Medicine

## 2015-05-28 ENCOUNTER — Encounter (HOSPITAL_COMMUNITY): Payer: Self-pay | Admitting: Emergency Medicine

## 2015-05-28 ENCOUNTER — Emergency Department (HOSPITAL_COMMUNITY): Payer: Medicare HMO

## 2015-05-28 DIAGNOSIS — Z792 Long term (current) use of antibiotics: Secondary | ICD-10-CM | POA: Insufficient documentation

## 2015-05-28 DIAGNOSIS — Z7982 Long term (current) use of aspirin: Secondary | ICD-10-CM | POA: Diagnosis not present

## 2015-05-28 DIAGNOSIS — M545 Low back pain: Secondary | ICD-10-CM | POA: Insufficient documentation

## 2015-05-28 DIAGNOSIS — Z96642 Presence of left artificial hip joint: Secondary | ICD-10-CM | POA: Diagnosis not present

## 2015-05-28 DIAGNOSIS — S7002XA Contusion of left hip, initial encounter: Secondary | ICD-10-CM

## 2015-05-28 DIAGNOSIS — R197 Diarrhea, unspecified: Secondary | ICD-10-CM | POA: Diagnosis not present

## 2015-05-28 DIAGNOSIS — S79912A Unspecified injury of left hip, initial encounter: Secondary | ICD-10-CM | POA: Diagnosis present

## 2015-05-28 DIAGNOSIS — M549 Dorsalgia, unspecified: Secondary | ICD-10-CM

## 2015-05-28 DIAGNOSIS — Y92002 Bathroom of unspecified non-institutional (private) residence single-family (private) house as the place of occurrence of the external cause: Secondary | ICD-10-CM | POA: Insufficient documentation

## 2015-05-28 DIAGNOSIS — F329 Major depressive disorder, single episode, unspecified: Secondary | ICD-10-CM | POA: Diagnosis not present

## 2015-05-28 DIAGNOSIS — Y999 Unspecified external cause status: Secondary | ICD-10-CM | POA: Diagnosis not present

## 2015-05-28 DIAGNOSIS — W109XXA Fall (on) (from) unspecified stairs and steps, initial encounter: Secondary | ICD-10-CM | POA: Diagnosis not present

## 2015-05-28 DIAGNOSIS — R0789 Other chest pain: Secondary | ICD-10-CM | POA: Insufficient documentation

## 2015-05-28 DIAGNOSIS — Y9301 Activity, walking, marching and hiking: Secondary | ICD-10-CM | POA: Insufficient documentation

## 2015-05-28 DIAGNOSIS — Z79891 Long term (current) use of opiate analgesic: Secondary | ICD-10-CM | POA: Diagnosis not present

## 2015-05-28 DIAGNOSIS — M1990 Unspecified osteoarthritis, unspecified site: Secondary | ICD-10-CM | POA: Diagnosis not present

## 2015-05-28 LAB — CBC WITH DIFFERENTIAL/PLATELET
BASOS ABS: 0 10*3/uL (ref 0.0–0.1)
Basophils Relative: 0 %
Eosinophils Absolute: 0.1 10*3/uL (ref 0.0–0.7)
Eosinophils Relative: 1 %
HEMATOCRIT: 37.4 % (ref 36.0–46.0)
Hemoglobin: 12.2 g/dL (ref 12.0–15.0)
LYMPHS PCT: 36 %
Lymphs Abs: 4.9 10*3/uL — ABNORMAL HIGH (ref 0.7–4.0)
MCH: 31.5 pg (ref 26.0–34.0)
MCHC: 32.6 g/dL (ref 30.0–36.0)
MCV: 96.6 fL (ref 78.0–100.0)
Monocytes Absolute: 0.5 10*3/uL (ref 0.1–1.0)
Monocytes Relative: 4 %
NEUTROS ABS: 8.3 10*3/uL — AB (ref 1.7–7.7)
Neutrophils Relative %: 59 %
Platelets: 414 10*3/uL — ABNORMAL HIGH (ref 150–400)
RBC: 3.87 MIL/uL (ref 3.87–5.11)
RDW: 13.7 % (ref 11.5–15.5)
WBC: 13.9 10*3/uL — AB (ref 4.0–10.5)

## 2015-05-28 LAB — COMPREHENSIVE METABOLIC PANEL
ALT: 18 U/L (ref 14–54)
AST: 17 U/L (ref 15–41)
Albumin: 3.9 g/dL (ref 3.5–5.0)
Alkaline Phosphatase: 93 U/L (ref 38–126)
Anion gap: 6 (ref 5–15)
BILIRUBIN TOTAL: 0.5 mg/dL (ref 0.3–1.2)
BUN: 13 mg/dL (ref 6–20)
CO2: 26 mmol/L (ref 22–32)
CREATININE: 0.87 mg/dL (ref 0.44–1.00)
Calcium: 9 mg/dL (ref 8.9–10.3)
Chloride: 105 mmol/L (ref 101–111)
GFR calc Af Amer: >60 mL/min (ref >60)
Glucose, Bld: 92 mg/dL (ref 65–99)
Potassium: 4 mmol/L (ref 3.5–5.1)
Sodium: 137 mmol/L (ref 135–145)
TOTAL PROTEIN: 7.4 g/dL (ref 6.5–8.1)

## 2015-05-28 LAB — TROPONIN I: Troponin I: <0.03 ng/mL (ref <0.031)

## 2015-05-28 LAB — LIPASE, BLOOD: LIPASE: 16 U/L (ref 11–51)

## 2015-05-28 MED ORDER — LORAZEPAM 2 MG/ML IJ SOLN
1.0000 mg | Freq: Once | INTRAMUSCULAR | Status: AC
  Administered 2015-05-28: 1 mg via INTRAVENOUS
  Filled 2015-05-28: qty 1

## 2015-05-28 MED ORDER — SODIUM CHLORIDE 0.9 % IV SOLN
INTRAVENOUS | Status: DC
  Administered 2015-05-28: 12:00:00 via INTRAVENOUS

## 2015-05-28 MED ORDER — HYDROMORPHONE HCL 1 MG/ML IJ SOLN
1.0000 mg | Freq: Once | INTRAMUSCULAR | Status: AC
  Administered 2015-05-28: 1 mg via INTRAVENOUS
  Filled 2015-05-28: qty 1

## 2015-05-28 MED ORDER — ONDANSETRON HCL 4 MG/2ML IJ SOLN
4.0000 mg | Freq: Once | INTRAMUSCULAR | Status: DC
  Filled 2015-05-28: qty 2

## 2015-05-28 MED ORDER — SODIUM CHLORIDE 0.9 % IV BOLUS (SEPSIS)
1000.0000 mL | Freq: Once | INTRAVENOUS | Status: AC
  Administered 2015-05-28: 1000 mL via INTRAVENOUS

## 2015-05-28 NOTE — Discharge Instructions (Signed)
Muscle Strain °A muscle strain is an injury that occurs when a muscle is stretched beyond its normal length. Usually a small number of muscle fibers are torn when this happens. Muscle strain is rated in degrees. First-degree strains have the least amount of muscle fiber tearing and pain. Second-degree and third-degree strains have increasingly more tearing and pain.  °Usually, recovery from muscle strain takes 1-2 weeks. Complete healing takes 5-6 weeks.  °CAUSES  °Muscle strain happens when a sudden, violent force placed on a muscle stretches it too far. This may occur with lifting, sports, or a fall.  °RISK FACTORS °Muscle strain is especially common in athletes.  °SIGNS AND SYMPTOMS °At the site of the muscle strain, there may be: °· Pain. °· Bruising. °· Swelling. °· Difficulty using the muscle due to pain or lack of normal function. °DIAGNOSIS  °Your health care provider will perform a physical exam and ask about your medical history. °TREATMENT  °Often, the best treatment for a muscle strain is resting, icing, and applying cold compresses to the injured area.   °HOME CARE INSTRUCTIONS  °· Use the PRICE method of treatment to promote muscle healing during the first 2-3 days after your injury. The PRICE method involves: °¨ Protecting the muscle from being injured again. °¨ Restricting your activity and resting the injured body part. °¨ Icing your injury. To do this, put ice in a plastic bag. Place a towel between your skin and the bag. Then, apply the ice and leave it on from 15-20 minutes each hour. After the third day, switch to moist heat packs. °¨ Apply compression to the injured area with a splint or elastic bandage. Be careful not to wrap it too tightly. This may interfere with blood circulation or increase swelling. °¨ Elevate the injured body part above the level of your heart as often as you can. °· Only take over-the-counter or prescription medicines for pain, discomfort, or fever as directed by your  health care provider. °· Warming up prior to exercise helps to prevent future muscle strains. °SEEK MEDICAL CARE IF:  °· You have increasing pain or swelling in the injured area. °· You have numbness, tingling, or a significant loss of strength in the injured area. °MAKE SURE YOU:  °· Understand these instructions. °· Will watch your condition. °· Will get help right away if you are not doing well or get worse. °  °This information is not intended to replace advice given to you by your health care provider. Make sure you discuss any questions you have with your health care provider. °  °Document Released: 12/25/2004 Document Revised: 10/15/2012 Document Reviewed: 07/24/2012 °Elsevier Interactive Patient Education ©2016 [employer] °Contusion °A contusion is a deep bruise. Contusions are the result of a blunt injury to tissues and muscle fibers under the skin. The injury causes bleeding under the skin. The skin overlying the contusion may turn blue, purple, or yellow. Minor injuries will give you a painless contusion, but more severe contusions may stay painful and swollen for a few weeks.  °CAUSES  °This condition is usually caused by a blow, trauma, or direct force to an area of the body. °SYMPTOMS  °Symptoms of this condition include: °· Swelling of the injured area. °· Pain and tenderness in the injured area. °· Discoloration. The area may have redness and then turn blue, purple, or yellow. °DIAGNOSIS  °This condition is diagnosed based on a physical exam and medical history. An X-ray, CT scan, or MRI may be needed   to determine if there are any associated injuries, such as broken bones (fractures). °TREATMENT  °Specific treatment for this condition depends on what area of the body was injured. In general, the best treatment for a contusion is resting, icing, applying pressure to (compression), and elevating the injured area. This is often called the RICE strategy. Over-the-counter anti-inflammatory medicines  may also be recommended for pain control.  °HOME CARE INSTRUCTIONS  °· Rest the injured area. °· If directed, apply ice to the injured area: °¨ Put ice in a plastic bag. °¨ Place a towel between your skin and the bag. °¨ Leave the ice on for 20 minutes, 2-3 times per day. °· If directed, apply light compression to the injured area using an elastic bandage. Make sure the bandage is not wrapped too tightly. Remove and reapply the bandage as directed by your health care provider. °· If possible, raise (elevate) the injured area above the level of your heart while you are sitting or lying down. °· Take over-the-counter and prescription medicines only as told by your health care provider. °SEEK MEDICAL CARE IF: °· Your symptoms do not improve after several days of treatment. °· Your symptoms get worse. °· You have difficulty moving the injured area. °SEEK IMMEDIATE MEDICAL CARE IF:  °· You have severe pain. °· You have numbness in a hand or foot. °· Your hand or foot turns pale or cold. °  °This information is not intended to replace advice given to you by your health care provider. Make sure you discuss any questions you have with your health care provider. °  °Document Released: 10/04/2004 Document Revised: 09/15/2014 Document Reviewed: 05/12/2014 °Elsevier Interactive Patient Education ©2016 [employer] Inc. ° °

## 2015-05-28 NOTE — ED Provider Notes (Signed)
CSN: [id number]     Arrival date & time 05/28/15  [id number] History   First MD Initiated Contact with Patient 05/28/15 1024     Chief Complaint  Patient presents with  . Diarrhea  . Fall  . Chest Pain     (Consider location/radiation/quality/duration/timing/severity/associated sxs/prior Treatment) HPI Comments: Patient here complaining of several days of diarrhea and weakness. Patient's diarrhea has been watery and without blood. Has also had some sharp chest discomfort which lasts for a couple seconds without associated anginal type symptoms. Patient has chronic pain from prior orthopedic injuries and uses a cane. All walking on the stairs to go to the bathroom she tripped down 2 stairs and fell onto her left side. No head or neck injury. Complains of sharp pain to her left hip and left lower lumbar pain that worse with movement. Denies any bowel or bladder dysfunction  Patient is a 45 y.o. female presenting with diarrhea, fall, and chest pain. The history is provided by the patient.  Diarrhea Fall Associated symptoms include chest pain.  Chest Pain   Past Medical History  Diagnosis Date  . Anxiety   . Depression   . Arthritis   . Osteoarthritis   . Sciatica   . Pneumonia 12/16  . Chronic lower back pain    Past Surgical History  Procedure Laterality Date  . Appendectomy  2011  . Lumbar laminectomy/decompression microdiscectomy  01/10/2011    Procedure: LUMBAR LAMINECTOMY/DECOMPRESSION MICRODISCECTOMY;  Surgeon: [name];  Location: [facility];  Service: Orthopedics;  Laterality: N/A;  Decompression L4 - L5  (X-Ray)  . Lumbar laminectomy/decompression microdiscectomy  08/02/2011    Procedure: LUMBAR LAMINECTOMY/DECOMPRESSION MICRODISCECTOMY;  Surgeon: [name], MD;  Location: [facility];  Service: Orthopedics;  Laterality: N/A;  Re-do Decompression L4-L5  . Back surgery    . Total hip arthroplasty Left 03/08/2015    ANTERIOR APPROACH  . Joint replacement    . Hernia repair   4/16    umbilical  . Total hip arthroplasty Left 03/08/2015    Procedure: TOTAL HIP ARTHROPLASTY ANTERIOR APPROACH;  Surgeon: [name], MD;  Location: [facility];  Service: Orthopedics;  Laterality: Left;   Family History  Problem Relation Age of Onset  . Heart failure Mother    Social History  Substance Use Topics  . Smoking status: Never Smoker   . Smokeless tobacco: Never Used     Comment: tried smoking 3 days 2012  . Alcohol Use: No   OB History    No data available     Review of Systems  Cardiovascular: Positive for chest pain.  Gastrointestinal: Positive for diarrhea.  All other systems reviewed and are negative.     Allergies  Acetaminophen; Other; Tramadol; and Hydrocodone  Home Medications   Prior to Admission medications   Medication Sig Start Date End Date Taking? Authorizing Provider  clonazePAM (KLONOPIN) 1 MG tablet Take 1 mg by mouth 2 (two) times daily.   Yes Historical Provider, MD  gabapentin (NEURONTIN) 300 MG capsule Take 600 mg by mouth at bedtime. Take two (2) 300 mg tablets to = 600 mg qHS   Yes Historical Provider, MD  HYDROmorphone (DILAUDID) 4 MG tablet Take 1 tablet (4 mg total) by mouth every 6 (six) hours as needed for severe pain. Patient taking differently: Take 4 mg by mouth 4 (four) times daily.  05/20/15  Yes Bethann BerkshireJoseph Zammit, MD  methocarbamol (ROBAXIN) 750 MG tablet Take 750 mg by mouth 2 (two) times daily as  needed for muscle spasms.    Yes Historical Provider, MD  traZODone (DESYREL) 150 MG tablet Take 225 mg by mouth at bedtime.  03/04/15  Yes Historical Provider, MD  venlafaxine XR (EFFEXOR-XR) 150 MG 24 hr capsule Take 300 mg by mouth daily. 09/21/14  Yes Historical Provider, MD  aspirin EC 325 MG EC tablet Take 1 tablet (325 mg total) by mouth 2 (two) times daily after a meal. Patient not taking: Reported on 05/20/2015 03/10/15   [name], PA-C  cephALEXin (KEFLEX) 500 MG capsule Take 1 capsule (500 mg total) by mouth 4 (four) times  daily. Patient not taking: Reported on 05/28/2015 05/20/15   [name], MD  methocarbamol (ROBAXIN) 500 MG tablet Take 1 tablet (500 mg total) by mouth every 6 (six) hours as needed for muscle spasms. Patient not taking: Reported on 05/20/2015 03/10/15   [name], PA-C  ondansetron (ZOFRAN ODT) 4 MG disintegrating tablet  ODT q4 hours prn nausea/vomit Patient not taking: Reported on 05/28/2015 05/20/15   [name], MD   BP 146/114 mmHg  Pulse 90  Temp(Src) 98.8 F (37.1 C) (Oral)  Resp 23  SpO2 100% Physical Exam  Constitutional: She is oriented to person, place, and time. She appears well-developed and well-nourished.  Non-toxic appearance. No distress.  HENT:  Head: Normocephalic and atraumatic.  Eyes: Conjunctivae, EOM and lids are normal. Pupils are equal, round, and reactive to light.  Neck: Normal range of motion. Neck supple. No tracheal deviation present. No thyroid mass present.  Cardiovascular: Normal rate, regular rhythm and normal heart sounds.  Exam reveals no gallop.   No murmur heard. Pulmonary/Chest: Effort normal and breath sounds normal. No stridor. No respiratory distress. She has no decreased breath sounds. She has no wheezes. She has no rhonchi. She has no rales.  Abdominal: Soft. Normal appearance and bowel sounds are normal. She exhibits no distension. There is no tenderness. There is no rebound and no CVA tenderness.  Musculoskeletal: Normal range of motion. She exhibits no edema or tenderness.       Lumbar back: She exhibits spasm.       Back:  Neurological: She is alert and oriented to person, place, and time. She has normal strength. No cranial nerve deficit or sensory deficit. GCS eye subscore is 4. GCS verbal subscore is 5. GCS motor subscore is 6.  Skin: Skin is warm and dry. No abrasion and no rash noted.  Psychiatric: She has a normal mood and affect. Her speech is normal and behavior is normal.  Nursing note and vitals reviewed.   ED Course    Procedures (including critical care time) Labs Review Labs Reviewed  C DIFFICILE QUICK SCREEN W PCR REFLEX  CBC WITH DIFFERENTIAL/PLATELET  COMPREHENSIVE METABOLIC PANEL  LIPASE, BLOOD  TROPONIN I    Imaging Review No results found. I have personally reviewed and evaluated these images and lab results as part of my medical decision-making.   EKG Interpretation   Date/Time:  Saturday May 28 2015 10:12:42 EDT Ventricular Rate:  86 PR Interval:  160 QRS Duration: 88 QT Interval:  353 QTC Calculation: 422 R Axis:   27 Text Interpretation:  Sinus rhythm Probable left atrial enlargement  Baseline wander in lead(s) V2 No significant change since last tracing  Confirmed by Trygve Thal  MD, Lenice Koper ([id number]) on 05/28/2015 10:24:54 AM      MDM   Final diagnoses:  None    Patient given medications here feels better. X-rays reviewed no acute injuries noted.  Patient had atypical chest pain and had negative troponin. States she has had pain in her left hip ever since she had surgery several months ago and have informed her that she needs to follow-up with her orthopedist for this.    Lorre Nick, MD 05/28/15 [phone]

## 2015-05-28 NOTE — ED Notes (Signed)
Pt reports diarrhea and weakness for the past week with 2 episodes of emesis. Pt got up to use restroom today and fell onto L side. Pt complains of back, L leg pain, chest tightness and sob.

## 2015-05-28 NOTE — ED Notes (Signed)
Patient assisted to restroom to collect sample by Tresa EndoKelly, EMT

## 2015-05-28 NOTE — ED Notes (Signed)
Patient reports diarrhea and weakness for several days. Patient states she has had @9  episodes of diarrhea in the past 24 hours. Patient states this am she was walking on the stairs when she became weak, causing her to "fall down the stairs". Patient states she is having left leg and back pain, which are both chronic in nature. Patient states she also felt SOB this week. Patient is speaking in complete sentences without distress.

## 2015-06-30 ENCOUNTER — Other Ambulatory Visit (HOSPITAL_COMMUNITY): Payer: Self-pay | Admitting: Orthopedic Surgery

## 2015-06-30 ENCOUNTER — Ambulatory Visit (HOSPITAL_COMMUNITY)
Admission: RE | Admit: 2015-06-30 | Discharge: 2015-06-30 | Disposition: A | Discharge status: Home / Self Care | Payer: Medicare HMO | Source: Ambulatory Visit | Attending: Orthopedic Surgery | Admitting: Orthopedic Surgery

## 2015-06-30 DIAGNOSIS — F419 Anxiety disorder, unspecified: Secondary | ICD-10-CM | POA: Diagnosis not present

## 2015-06-30 DIAGNOSIS — M7989 Other specified soft tissue disorders: Secondary | ICD-10-CM | POA: Diagnosis not present

## 2015-06-30 DIAGNOSIS — M79662 Pain in left lower leg: Secondary | ICD-10-CM

## 2015-06-30 DIAGNOSIS — F329 Major depressive disorder, single episode, unspecified: Secondary | ICD-10-CM | POA: Insufficient documentation

## 2015-06-30 DIAGNOSIS — M79605 Pain in left leg: Secondary | ICD-10-CM | POA: Diagnosis not present

## 2015-06-30 NOTE — Progress Notes (Signed)
VASCULAR LAB PRELIMINARY  PRELIMINARY  PRELIMINARY  PRELIMINARY  Left lower extremity venous duplex completed.    Preliminary report:  Left:  No evidence of DVT, superficial thrombosis, or Baker's cyst.  Judith Hardy, RVS 06/30/2015, 3:28 PM

## 2015-07-08 ENCOUNTER — Emergency Department (HOSPITAL_COMMUNITY)
Admission: EM | Admit: 2015-07-08 | Discharge: 2015-07-08 | Disposition: A | Discharge status: Home / Self Care | Payer: Medicare HMO | Attending: Emergency Medicine | Admitting: Emergency Medicine

## 2015-07-08 ENCOUNTER — Encounter (HOSPITAL_COMMUNITY): Payer: Self-pay

## 2015-07-08 ENCOUNTER — Emergency Department (HOSPITAL_COMMUNITY): Payer: Medicare HMO

## 2015-07-08 DIAGNOSIS — Z79899 Other long term (current) drug therapy: Secondary | ICD-10-CM | POA: Insufficient documentation

## 2015-07-08 DIAGNOSIS — M5442 Lumbago with sciatica, left side: Secondary | ICD-10-CM | POA: Insufficient documentation

## 2015-07-08 DIAGNOSIS — F329 Major depressive disorder, single episode, unspecified: Secondary | ICD-10-CM | POA: Insufficient documentation

## 2015-07-08 DIAGNOSIS — Z96649 Presence of unspecified artificial hip joint: Secondary | ICD-10-CM | POA: Insufficient documentation

## 2015-07-08 DIAGNOSIS — M1990 Unspecified osteoarthritis, unspecified site: Secondary | ICD-10-CM | POA: Diagnosis not present

## 2015-07-08 LAB — PREGNANCY, URINE: PREG TEST UR: NEGATIVE

## 2015-07-08 MED ORDER — KETOROLAC TROMETHAMINE 30 MG/ML IJ SOLN: 30.0000 mg | Freq: Once | INTRAMUSCULAR | Status: DC

## 2015-07-08 MED ORDER — KETOROLAC TROMETHAMINE 30 MG/ML IJ SOLN
30.0000 mg | Freq: Once | INTRAMUSCULAR | Status: AC
  Administered 2015-07-08: 30 mg via INTRAVENOUS
  Filled 2015-07-08: qty 1

## 2015-07-08 MED ORDER — HYDROMORPHONE HCL 1 MG/ML IJ SOLN: 1.0000 mg | Freq: Once | INTRAMUSCULAR | Status: DC

## 2015-07-08 MED ORDER — HYDROMORPHONE HCL 1 MG/ML IJ SOLN
1.0000 mg | Freq: Once | INTRAMUSCULAR | Status: AC
  Administered 2015-07-08: 1 mg via INTRAVENOUS
  Filled 2015-07-08: qty 1

## 2015-07-08 MED ORDER — PREDNISONE 10 MG (21) PO TBPK: 10.0000 mg | ORAL_TABLET | Freq: Every day | ORAL | Status: DC

## 2015-07-08 NOTE — ED Notes (Addendum)
Pt c/o L lower back, L hip, and L leg pain x 1 week r/t "damaged sciatic nerve."  Pain score 10/10.  Hx of L hip replacement in February 2017.  Pt reports that she was recently seen by [facility] and sts they sent her for an ultrasound to r/o a blood clot.  Pt reports she has been taking "tons of ibuprofen."

## 2015-07-08 NOTE — ED Notes (Signed)
Pt cannot use restroom at this time, aware urine specimen is needed.  

## 2015-07-08 NOTE — ED Provider Notes (Signed)
CSN: [id number]     Arrival date & time 07/08/15  [id number] History   First MD Initiated Contact with Patient 07/08/15 [phone]     Chief Complaint  Patient presents with  . Sciatica     (Consider location/radiation/quality/duration/timing/severity/associated sxs/prior Treatment) HPI 45 year old female who presents with left lower extremity pain. History of chronic low back pain and sciatica. She has had progressive LLE pain over past 2 weeks. History of left total hip replacement in February this year. She saw her her orthopedic surgeon last week and sent her DVT US that was negative. Was told by her orthopedic surgeon that she has bursitis as well. She did have a mechanical fall 2 weeks prior when she fell on her buttock. States since hip surgery with some numbness over anterior thigh, unchanged. States for several years with some mild numbness in feet from sciatica, unchanged. No new weakness, although limited by pain. No bowel incontinence, fever, urinary incontinence or urinary retention.Taking around the clock motrin without good effect.   Past Medical History  Diagnosis Date  . Anxiety   . Depression   . Arthritis   . Osteoarthritis   . Sciatica   . Pneumonia 12/16  . Chronic lower back pain    Past Surgical History  Procedure Laterality Date  . Appendectomy  2011  . Lumbar laminectomy/decompression microdiscectomy  01/10/2011    Procedure: LUMBAR LAMINECTOMY/DECOMPRESSION MICRODISCECTOMY;  Surgeon: [name];  Location: [facility];  Service: Orthopedics;  Laterality: N/A;  Decompression L4 - L5  (X-Ray)  . Lumbar laminectomy/decompression microdiscectomy  08/02/2011    Procedure: LUMBAR LAMINECTOMY/DECOMPRESSION MICRODISCECTOMY;  Surgeon: [name], MD;  Location: [facility];  Service: Orthopedics;  Laterality: N/A;  Re-do Decompression L4-L5  . Back surgery    . Total hip arthroplasty Left 03/08/2015    ANTERIOR APPROACH  . Joint replacement    . Hernia repair  4/16    umbilical   . Total hip arthroplasty Left 03/08/2015    Procedure: TOTAL HIP ARTHROPLASTY ANTERIOR APPROACH;  Surgeon: [name], MD;  Location: [facility];  Service: Orthopedics;  Laterality: Left;   Family History  Problem Relation Age of Onset  . Heart failure Mother    Social History  Substance Use Topics  . Smoking status: Never Smoker   . Smokeless tobacco: Never Used     Comment: tried smoking 3 days 2012  . Alcohol Use: No   OB History    No data available     Review of Systems 10/14 systems reviewed and are negative other than those stated in the HPI    Allergies  Acetaminophen; Other; Tramadol; and Hydrocodone  Home Medications   Prior to Admission medications   Medication Sig Start Date End Date Taking? Authorizing Provider  clonazePAM (KLONOPIN) 1 MG tablet Take 1 mg by mouth 2 (two) times daily.   Yes Historical Provider, MD  gabapentin (NEURONTIN) 300 MG capsule Take 600 mg by mouth at bedtime. Take two (2) 300 mg tablets to = 600 mg qHS   Yes Historical Provider, MD  methocarbamol (ROBAXIN) 750 MG tablet Take 750 mg by mouth 2 (two) times daily as needed for muscle spasms.    Yes Historical Provider, MD  traZODone (DESYREL) 150 MG tablet Take 225 mg by mouth at bedtime.  03/04/15  Yes Historical Provider, MD  venlafaxine XR (EFFEXOR-XR) 150 MG 24 hr capsule Take 300 mg by mouth daily. 09/21/14  Yes Historical Provider, MD  aspirin EC 325 MG  EC tablet Take 1 tablet (325 mg total) by mouth 2 (two) times daily after a meal. Patient not taking: Reported on 05/20/2015 03/10/15   Elodia FlorenceAndrew Nida, PA-C  cephALEXin (KEFLEX) 500 MG capsule Take 1 capsule (500 mg total) by mouth 4 (four) times daily. Patient not taking: Reported on 05/28/2015 05/20/15   Bethann BerkshireJoseph Zammit, MD  HYDROmorphone (DILAUDID) 4 MG tablet Take 1 tablet (4 mg total) by mouth every 6 (six) hours as needed for severe pain. Patient not taking: Reported on 07/08/2015 05/20/15   Bethann BerkshireJoseph Zammit, MD  methocarbamol (ROBAXIN) 500 MG  tablet Take 1 tablet (500 mg total) by mouth every 6 (six) hours as needed for muscle spasms. Patient not taking: Reported on 05/20/2015 03/10/15   Elodia FlorenceAndrew Nida, PA-C  ondansetron (ZOFRAN ODT) 4 MG disintegrating tablet 4mg  ODT q4 hours prn nausea/vomit Patient not taking: Reported on 05/28/2015 05/20/15   Bethann BerkshireJoseph Zammit, MD  predniSONE (STERAPRED UNI-PAK 21 TAB) 10 MG (21) TBPK tablet Take 1 tablet (10 mg total) by mouth daily. Take 6 tabs by mouth daily  for 2 days, then 5 tabs for 2 days, then 4 tabs for 2 days, then 3 tabs for 2 days, 2 tabs for 2 days, then 1 tab by mouth daily for 2 days 07/08/15   Lavera Guiseana Duo Liu, MD   BP 120/72 mmHg  Pulse 65  Temp(Src) 98.1 F (36.7 C) (Oral)  Resp 18  SpO2 99% Physical Exam Physical Exam  Nursing note and vitals reviewed. Constitutional: Well developed, well nourished, non-toxic, and in no acute distress Head: Normocephalic and atraumatic.  Mouth/Throat: Oropharynx is clear and moist.  Neck: Normal range of motion. Neck supple.  Cardiovascular: Normal rate and regular rhythm.  +2 DP pulses bilaterally Pulmonary/Chest: Effort normal and breath sounds normal.  Abdominal: Soft. There is no tenderness. There is no rebound and no guarding.  Musculoskeletal: Normal range of motion. in lower extremities. No swelling or deformities. Neurological: Alert, no facial droop, fluent speech, moves all extremities symmetrically, diminished sensation over left foot (reported baseline), full strength ankle dorsi/plantar flexion, knee flexion/extension Skin: Skin is warm and dry.  Psychiatric: Cooperative  ED Course  Procedures (including critical care time) Labs Review Labs Reviewed  PREGNANCY, URINE    Imaging Review Dg Lumbar Spine Complete  07/08/2015  CLINICAL DATA:  Recent fall with mid low back pain. EXAM: LUMBAR SPINE - COMPLETE 4+ VIEW COMPARISON:  Lumbar spine radiographs 05/28/2015. FINDINGS: Five non rib-bearing lumbar type vertebral bodies are present.  Vertebral body heights and alignment are maintained. No acute fracture traumatic subluxation is present. Left total hip arthroplasty is again noted. IUD is in place. IMPRESSION: Negative lumbar spine radiographs. No acute fracture or traumatic subluxation. Electronically Signed   By: Marin Robertshristopher  Mattern M.D.   On: 07/08/2015 10:13   Dg Hip Unilat With Pelvis 2-3 Views Left  07/08/2015  CLINICAL DATA:  Left hip pain after recent fall 2 weeks ago. EXAM: DG HIP (WITH OR WITHOUT PELVIS) 2-3V LEFT COMPARISON:  Radiographs of May 28, 2015. FINDINGS: Left hip arthroplasty is again noted. No fracture or dislocation is noted. Right hip appears normal. IMPRESSION: Status post left total hip arthroplasty.  No acute abnormality seen. Electronically Signed   By: Lupita RaiderJames  Green Jr, M.D.   On: 07/08/2015 10:14   I have personally reviewed and evaluated these images and lab results as part of my medical decision-making.   EKG Interpretation None      MDM   Final diagnoses:  Left-sided low  back pain with left-sided sciatica    History of chronic low back pain and sciatica, and she states this is flare of her typical pain since fall. Per her PCP's note, she has had h/o pain medication abuse, and denied from several pain clinics she was been referred to. She has been seen at multiple places to receive pain medications. XR of back and hip without concern for serious traumatic injury after her fall. Discussed that she needs to follow-up with pain clinic and PCP for further pain control management. Exam and history not concerning for spinal cord injury or other emergent cause. Strict return and follow-up instructions reviewed. She expressed understanding of all discharge instructions and felt comfortable with the plan of care.    Lavera Guise, MD 07/08/15 1115

## 2015-07-08 NOTE — Discharge Instructions (Signed)
You need to follow-up with your primary care provider and pain clinic for further pain management.  Chronic Back Pain  When back pain lasts longer than 3 months, it is called chronic back pain.People with chronic back pain often go through certain periods that are more intense (flare-ups).  CAUSES Chronic back pain can be caused by wear and tear (degeneration) on different structures in your back. These structures include:  The bones of your spine (vertebrae) and the joints surrounding your spinal cord and nerve roots (facets).  The strong, fibrous tissues that connect your vertebrae (ligaments). Degeneration of these structures may result in pressure on your nerves. This can lead to constant pain. HOME CARE INSTRUCTIONS  Avoid bending, heavy lifting, prolonged sitting, and activities which make the problem worse.  Take brief periods of rest throughout the day to reduce your pain. Lying down or standing usually is better than sitting while you are resting.  Take over-the-counter or prescription medicines only as directed by your caregiver. SEEK IMMEDIATE MEDICAL CARE IF:   You have weakness or numbness in one of your legs or feet.  You have trouble controlling your bladder or bowels.  You have nausea, vomiting, abdominal pain, shortness of breath, or fainting.   This information is not intended to replace advice given to you by your health care provider. Make sure you discuss any questions you have with your health care provider.   Document Released: 02/02/2004 Document Revised: 03/19/2011 Document Reviewed: 06/14/2014 Elsevier Interactive Patient Education [employer]2016 Elsevier Inc.

## 2015-07-29 ENCOUNTER — Emergency Department (HOSPITAL_COMMUNITY)
Admission: EM | Admit: 2015-07-29 | Discharge: 2015-07-29 | Disposition: A | Discharge status: Home / Self Care | Payer: Medicare HMO | Attending: Emergency Medicine | Admitting: Emergency Medicine

## 2015-07-29 ENCOUNTER — Emergency Department (HOSPITAL_BASED_OUTPATIENT_CLINIC_OR_DEPARTMENT_OTHER)
Admit: 2015-07-29 | Discharge: 2015-07-29 | Disposition: A | Discharge status: Home / Self Care | Payer: Medicare HMO | Attending: Emergency Medicine | Admitting: Emergency Medicine

## 2015-07-29 ENCOUNTER — Emergency Department (HOSPITAL_COMMUNITY): Payer: Medicare HMO

## 2015-07-29 ENCOUNTER — Encounter (HOSPITAL_COMMUNITY): Payer: Self-pay | Admitting: Emergency Medicine

## 2015-07-29 DIAGNOSIS — M5442 Lumbago with sciatica, left side: Secondary | ICD-10-CM | POA: Diagnosis not present

## 2015-07-29 DIAGNOSIS — Z79899 Other long term (current) drug therapy: Secondary | ICD-10-CM | POA: Insufficient documentation

## 2015-07-29 DIAGNOSIS — Z791 Long term (current) use of non-steroidal anti-inflammatories (NSAID): Secondary | ICD-10-CM | POA: Diagnosis not present

## 2015-07-29 DIAGNOSIS — M1990 Unspecified osteoarthritis, unspecified site: Secondary | ICD-10-CM | POA: Insufficient documentation

## 2015-07-29 DIAGNOSIS — F329 Major depressive disorder, single episode, unspecified: Secondary | ICD-10-CM | POA: Insufficient documentation

## 2015-07-29 DIAGNOSIS — M79609 Pain in unspecified limb: Secondary | ICD-10-CM

## 2015-07-29 DIAGNOSIS — Z96642 Presence of left artificial hip joint: Secondary | ICD-10-CM | POA: Diagnosis not present

## 2015-07-29 DIAGNOSIS — M5432 Sciatica, left side: Secondary | ICD-10-CM

## 2015-07-29 DIAGNOSIS — M25552 Pain in left hip: Secondary | ICD-10-CM | POA: Diagnosis present

## 2015-07-29 LAB — CBC WITH DIFFERENTIAL/PLATELET
BASOS PCT: 0 %
Basophils Absolute: 0.1 10*3/uL (ref 0.0–0.1)
EOS ABS: 0 10*3/uL (ref 0.0–0.7)
Eosinophils Relative: 0 %
HEMATOCRIT: 38.5 % (ref 36.0–46.0)
HEMOGLOBIN: 12.6 g/dL (ref 12.0–15.0)
LYMPHS ABS: 4.4 10*3/uL — AB (ref 0.7–4.0)
Lymphocytes Relative: 33 %
MCH: 31.3 pg (ref 26.0–34.0)
MCHC: 32.7 g/dL (ref 30.0–36.0)
MCV: 95.5 fL (ref 78.0–100.0)
Monocytes Absolute: 0.6 10*3/uL (ref 0.1–1.0)
Monocytes Relative: 4 %
NEUTROS ABS: 8.1 10*3/uL — AB (ref 1.7–7.7)
NEUTROS PCT: 63 %
PLATELETS: 440 10*3/uL — AB (ref 150–400)
RBC: 4.03 MIL/uL (ref 3.87–5.11)
RDW: 14.9 % (ref 11.5–15.5)
WBC: 13.1 10*3/uL — AB (ref 4.0–10.5)

## 2015-07-29 LAB — COMPREHENSIVE METABOLIC PANEL
ALT: 13 U/L — ABNORMAL LOW (ref 14–54)
ANION GAP: 7 (ref 5–15)
AST: 14 U/L — AB (ref 15–41)
Albumin: 4.6 g/dL (ref 3.5–5.0)
Alkaline Phosphatase: 96 U/L (ref 38–126)
BUN: 17 mg/dL (ref 6–20)
CHLORIDE: 104 mmol/L (ref 101–111)
CO2: 25 mmol/L (ref 22–32)
Calcium: 9.3 mg/dL (ref 8.9–10.3)
Creatinine, Ser: 0.83 mg/dL (ref 0.44–1.00)
Glucose, Bld: 95 mg/dL (ref 65–99)
POTASSIUM: 3.6 mmol/L (ref 3.5–5.1)
Sodium: 136 mmol/L (ref 135–145)
TOTAL PROTEIN: 8.4 g/dL — AB (ref 6.5–8.1)
Total Bilirubin: 0.7 mg/dL (ref 0.3–1.2)

## 2015-07-29 MED ORDER — HYDROMORPHONE HCL 1 MG/ML IJ SOLN
1.0000 mg | Freq: Once | INTRAMUSCULAR | Status: AC
  Administered 2015-07-29: 1 mg via INTRAMUSCULAR
  Filled 2015-07-29: qty 1

## 2015-07-29 MED ORDER — HYDROMORPHONE HCL 2 MG PO TABS
4.0000 mg | ORAL_TABLET | Freq: Once | ORAL | Status: AC
  Administered 2015-07-29: 4 mg via ORAL
  Filled 2015-07-29: qty 2

## 2015-07-29 MED ORDER — KETOROLAC TROMETHAMINE 30 MG/ML IJ SOLN
30.0000 mg | Freq: Once | INTRAMUSCULAR | Status: AC
  Administered 2015-07-29: 30 mg via INTRAVENOUS
  Filled 2015-07-29: qty 1

## 2015-07-29 NOTE — ED Notes (Signed)
Ultrasound in room

## 2015-07-29 NOTE — Progress Notes (Signed)
*  PRELIMINARY RESULTS* Vascular Ultrasound Left lower extremity venous duplex has been completed.  Preliminary findings: No evidence of DVT or baker's cyst. Compare to previous negative study 06/30/15.  Patient palpated knot on left upper calf. It appears to be hyperechoic lesion measuring 9 mm. Etiology unknown.  Farrel DemarkJill Eunice, RDMS, RVT  07/29/2015, 3:11 PM

## 2015-07-29 NOTE — ED Notes (Signed)
Pt called her husband who will be picking her up

## 2015-07-29 NOTE — ED Notes (Signed)
Per pt, states lower back pain radiating down left leg-bump on right leg and diarrhea for 3 weeks

## 2015-07-29 NOTE — ED Provider Notes (Signed)
CSN: [id number]     Arrival date & time 07/29/15  1150 History   First MD Initiated Contact with Patient 07/29/15 1225     Chief Complaint  Patient presents with  . Back Pain     (Consider location/radiation/quality/duration/timing/severity/associated sxs/prior Treatment) HPI 45 year old female who presents with left lower extremity pain. History of chronic low back pain and sciatica. She has had progressive LLE pain over past 3 weeks with mechanical fall on her left hip 2 days ago with worsening left hip pain. 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 Has been diagnosed by her orthopedist with bursitis of the left hip as well last month.States since hip surgery with some numbness over anterior thigh, unchanged. States for several years with some mild numbness in feet from sciatica, unchanged. No new weakness, although limited by pain. No bowel incontinence, fever, urinary incontinence or urinary retention.Taking around the clock motrin without good effect. With some diarrhea over past two week. No abd pain, urinary complaints, or vomiting.  Past Medical History  Diagnosis Date  . Anxiety   . Depression   . Arthritis   . Osteoarthritis   . Sciatica   . Pneumonia 12/16  . Chronic lower back pain    Past Surgical History  Procedure Laterality Date  . Appendectomy  2011  . Lumbar laminectomy/decompression microdiscectomy  01/10/2011    Procedure: LUMBAR LAMINECTOMY/DECOMPRESSION MICRODISCECTOMY;  Surgeon: Javier Docker;  Location: [facility];  Service: Orthopedics;  Laterality: N/A;  Decompression L4 - L5  (X-Ray)  . Lumbar laminectomy/decompression microdiscectomy  08/02/2011    Procedure: LUMBAR LAMINECTOMY/DECOMPRESSION MICRODISCECTOMY;  Surgeon: Javier Docker, MD;  Location: [facility];  Service: Orthopedics;  Laterality: N/A;  Re-do Decompression L4-L5  . Back surgery    . Total hip arthroplasty Left 03/08/2015     ANTERIOR APPROACH  . Joint replacement    . Hernia repair  4/16    umbilical  . Total hip arthroplasty Left 03/08/2015    Procedure: TOTAL HIP ARTHROPLASTY ANTERIOR APPROACH;  Surgeon: Marcene Corning, MD;  Location: [facility];  Service: Orthopedics;  Laterality: Left;   Family History  Problem Relation Age of Onset  . Heart failure Mother    Social History  Substance Use Topics  . Smoking status: Never Smoker   . Smokeless tobacco: Never Used     Comment: tried smoking 3 days 2012  . Alcohol Use: No   OB History    No data available     Review of Systems 10/14 systems reviewed and are negative other than those stated in the HPI   Allergies  Acetaminophen; Other; Tramadol; and Hydrocodone  Home Medications   Prior to Admission medications   Medication Sig Start Date End Date Taking? Authorizing Provider  clonazePAM (KLONOPIN) 1 MG tablet Take 1 mg by mouth 2 (two) times daily.   Yes Historical Provider, MD  gabapentin (NEURONTIN) 300 MG capsule Take 600 mg by mouth at bedtime. Take two (2) 300 mg tablets to = 600 mg qHS   Yes Historical Provider, MD  Chilton Si Tea POWD Take 8 oz by mouth daily with breakfast.   Yes Historical Provider, MD  HYDROmorphone (DILAUDID) 4 MG tablet Take 1 tablet (4 mg total) by mouth every 6 (six) hours as needed for severe pain. Patient taking differently: Take 4 mg by mouth 4 (four) times daily.  05/20/15  Yes [name], MD  ibuprofen (ADVIL,MOTRIN) 200 MG tablet Take 800 mg by mouth every 6 (six) hours as needed for fever, headache, mild pain, moderate pain or cramping.   Yes Historical Provider, MD  LYSINE PO Take 3 tablets by mouth daily with breakfast.   Yes Historical Provider, MD  methocarbamol (ROBAXIN) 750 MG tablet Take 750 mg by mouth 2 (two) times daily as needed for muscle spasms.    Yes Historical Provider, MD  traZODone (DESYREL) 150 MG tablet Take 225 mg by mouth at bedtime.  03/04/15  Yes Historical Provider, MD  venlafaxine XR  (EFFEXOR-XR) 150 MG 24 hr capsule Take 300 mg by mouth daily with breakfast.  09/21/14  Yes Historical Provider, MD  aspirin EC 325 MG EC tablet Take 1 tablet (325 mg total) by mouth 2 (two) times daily after a meal. Patient not taking: Reported on 05/20/2015 03/10/15   Elodia FlorenceAndrew Nida, PA-C  cephALEXin (KEFLEX) 500 MG capsule Take 1 capsule (500 mg total) by mouth 4 (four) times daily. Patient not taking: Reported on 05/28/2015 05/20/15   Bethann BerkshireJoseph Zammit, MD  methocarbamol (ROBAXIN) 500 MG tablet Take 1 tablet (500 mg total) by mouth every 6 (six) hours as needed for muscle spasms. Patient not taking: Reported on 05/20/2015 03/10/15   Elodia FlorenceAndrew Nida, PA-C  ondansetron (ZOFRAN ODT) 4 MG disintegrating tablet 4mg  ODT q4 hours prn nausea/vomit Patient not taking: Reported on 05/28/2015 05/20/15   Bethann BerkshireJoseph Zammit, MD  predniSONE (STERAPRED UNI-PAK 21 TAB) 10 MG (21) TBPK tablet Take 1 tablet (10 mg total) by mouth daily. Take 6 tabs by mouth daily  for 2 days, then 5 tabs for 2 days, then 4 tabs for 2 days, then 3 tabs for 2 days, 2 tabs for 2 days, then 1 tab by mouth daily for 2 days Patient not taking: Reported on 07/29/2015 07/08/15   Lavera Guiseana Duo Maxima Skelton, MD   BP 164/93 mmHg  Pulse 82  Temp(Src) 98.9 F (37.2 C) (Oral)  Resp 18  SpO2 100%  LMP 01/15/2008 Physical Exam Physical Exam  Nursing note and vitals reviewed.  Constitutional: Well developed, well nourished, non-toxic, and in no acute distress  Head: Normocephalic and atraumatic.  Mouth/Throat: Oropharynx is clear and moist.  Neck: Normal range of motion. Neck supple.  Cardiovascular: Normal rate and regular rhythm. +2 DP pulses bilaterally  Pulmonary/Chest: Effort normal and breath sounds normal.  Abdominal: Soft. There is no tenderness. There is no rebound and no guarding.  Musculoskeletal: Normal range of motion. in lower extremities. No swelling or deformities. Neurological: Alert, no facial droop, fluent speech, moves all extremities symmetrically,  diminished sensation over left foot (reported baseline), full strength ankle dorsi/plantar flexion, knee flexion/extension  Skin: Skin is warm and dry.  Psychiatric: Cooperative  ED Course  Procedures (including critical care time) Labs Review Labs Reviewed  CBC WITH DIFFERENTIAL/PLATELET - Abnormal; Notable for the following:    WBC 13.1 (*)    Platelets 440 (*)    Neutro Abs 8.1 (*)    Lymphs Abs 4.4 (*)    All other components within normal limits  COMPREHENSIVE METABOLIC PANEL - Abnormal; Notable for the following:    Total Protein 8.4 (*)    AST 14 (*)    ALT 13 (*)    All other components within normal limits  URINALYSIS, ROUTINE W REFLEX MICROSCOPIC (NOT AT [facility])  PREGNANCY, URINE    Imaging Review Dg Lumbar Spine Complete  07/29/2015  CLINICAL DATA:  Lumbago.  Fall 2 days prior EXAM: LUMBAR SPINE -  COMPLETE 4+ VIEW COMPARISON:  July 08, 2015 FINDINGS: Frontal, lateral, spot lumbosacral lateral, and bilateral oblique views were obtained. There are 5 non-rib-bearing lumbar type vertebral bodies. There is no fracture or spondylolisthesis. The disc spaces appear normal. No appreciable facet arthropathy. There is an intrauterine device in the mid pelvis. There is a total hip prosthesis on the left. IMPRESSION: No fracture or spondylolisthesis. No apparent arthropathy. There is an intrauterine device in the mid-pelvis. Electronically Signed   By: Bretta Bang III M.D.   On: 07/29/2015 14:30   Dg Hip Unilat With Pelvis 2-3 Views Left  07/29/2015  CLINICAL DATA:  Pain following fall 2 days prior EXAM: DG HIP (WITH OR WITHOUT PELVIS) 2-3V LEFT COMPARISON:  July 08, 2015 FINDINGS: Frontal pelvis as well as frontal and lateral left hip images were obtained. There is a total hip prosthesis on the left with prosthetic components appearing well-seated. No acute fracture or dislocation. Right hip joint appears normal. There is an intrauterine device in the mid-pelvis. IMPRESSION: Prosthetic  components on the left from total hip replacement appear well seated. No acute fracture or dislocation. No right hip joint arthropathy. Intrauterine device in mid pelvis. Electronically Signed   By: Bretta Bang III M.D.   On: 07/29/2015 14:31   I have personally reviewed and evaluated these images and lab results as part of my medical decision-making.   EKG Interpretation None      MDM   Final diagnoses:  Sciatica of left side  Left-sided low back pain with left-sided sciatica  Left hip pain    Presenting with acute on chronic low back pain and sciatica of the left lower extremity. Is well-appearing, with stable vital signs. She has baseline diminished sensation over the left foot and anterior thigh. Otherwise neuro intact. She is also seen ambulating with her cane in the hallway. Abdomen benign.  Blood work with baseline leukocytosis. [place] leg shows no DVT. Repeat x-rays of the lumbar spine and left hip shows no acute traumatic injuries. She follows with pain clinic and normally takes 4 mg of oral Dilaudid. She was referred back to her primary care doctor and pain clinic for further Pain control. Exam and history not concerning for spinal cord injury or other emergent cause. Strict return and follow-up instructions reviewed. She expressed understanding of all discharge instructions and felt comfortable with the plan of care.     Lavera Guise, MD 07/29/15 [phone]

## 2015-07-29 NOTE — Discharge Instructions (Signed)
Please follow-up with your pain clinic physician and primary care physician for further pain control. Return for worsening symptoms, including new numbness/weakness, fever, intractable vomiting or any other symptoms concerning to you.  Chronic Back Pain  When back pain lasts longer than 3 months, it is called chronic back pain.People with chronic back pain often go through certain periods that are more intense (flare-ups).  CAUSES Chronic back pain can be caused by wear and tear (degeneration) on different structures in your back. These structures include:  The bones of your spine (vertebrae) and the joints surrounding your spinal cord and nerve roots (facets).  The strong, fibrous tissues that connect your vertebrae (ligaments). Degeneration of these structures may result in pressure on your nerves. This can lead to constant pain. HOME CARE INSTRUCTIONS  Avoid bending, heavy lifting, prolonged sitting, and activities which make the problem worse.  Take brief periods of rest throughout the day to reduce your pain. Lying down or standing usually is better than sitting while you are resting.  Take over-the-counter or prescription medicines only as directed by your caregiver. SEEK IMMEDIATE MEDICAL CARE IF:   You have weakness or numbness in one of your legs or feet.  You have trouble controlling your bladder or bowels.  You have nausea, vomiting, abdominal pain, shortness of breath, or fainting.   This information is not intended to replace advice given to you by your health care provider. Make sure you discuss any questions you have with your health care provider.   Document Released: 02/02/2004 Document Revised: 03/19/2011 Document Reviewed: 06/14/2014 Elsevier Interactive Patient Education [employer]

## 2015-07-29 NOTE — ED Notes (Signed)
Pt unable to give urine at this time. Will attempt in a few

## 2015-08-04 ENCOUNTER — Encounter (HOSPITAL_COMMUNITY): Payer: Self-pay | Admitting: Emergency Medicine

## 2015-08-04 ENCOUNTER — Emergency Department (HOSPITAL_COMMUNITY)
Admission: EM | Admit: 2015-08-04 | Discharge: 2015-08-04 | Disposition: A | Discharge status: Home / Self Care | Payer: Medicare HMO | Attending: Emergency Medicine | Admitting: Emergency Medicine

## 2015-08-04 ENCOUNTER — Emergency Department (HOSPITAL_COMMUNITY): Payer: Medicare HMO

## 2015-08-04 DIAGNOSIS — W109XXA Fall (on) (from) unspecified stairs and steps, initial encounter: Secondary | ICD-10-CM | POA: Diagnosis not present

## 2015-08-04 DIAGNOSIS — Z791 Long term (current) use of non-steroidal anti-inflammatories (NSAID): Secondary | ICD-10-CM | POA: Insufficient documentation

## 2015-08-04 DIAGNOSIS — M5416 Radiculopathy, lumbar region: Secondary | ICD-10-CM | POA: Diagnosis not present

## 2015-08-04 DIAGNOSIS — F329 Major depressive disorder, single episode, unspecified: Secondary | ICD-10-CM | POA: Diagnosis not present

## 2015-08-04 DIAGNOSIS — Y929 Unspecified place or not applicable: Secondary | ICD-10-CM | POA: Insufficient documentation

## 2015-08-04 DIAGNOSIS — Z79899 Other long term (current) drug therapy: Secondary | ICD-10-CM | POA: Diagnosis not present

## 2015-08-04 DIAGNOSIS — R42 Dizziness and giddiness: Secondary | ICD-10-CM | POA: Diagnosis present

## 2015-08-04 DIAGNOSIS — Y939 Activity, unspecified: Secondary | ICD-10-CM | POA: Insufficient documentation

## 2015-08-04 DIAGNOSIS — Y999 Unspecified external cause status: Secondary | ICD-10-CM | POA: Diagnosis not present

## 2015-08-04 DIAGNOSIS — W19XXXA Unspecified fall, initial encounter: Secondary | ICD-10-CM

## 2015-08-04 HISTORY — DX: Bursopathy, unspecified: M71.9

## 2015-08-04 LAB — CBC WITH DIFFERENTIAL/PLATELET
BASOS PCT: 0 %
Basophils Absolute: 0 10*3/uL (ref 0.0–0.1)
EOS ABS: 0 10*3/uL (ref 0.0–0.7)
EOS PCT: 0 %
HCT: 38.5 % (ref 36.0–46.0)
HEMOGLOBIN: 12.4 g/dL (ref 12.0–15.0)
LYMPHS ABS: 5.1 10*3/uL — AB (ref 0.7–4.0)
Lymphocytes Relative: 34 %
MCH: 31 pg (ref 26.0–34.0)
MCHC: 32.2 g/dL (ref 30.0–36.0)
MCV: 96.3 fL (ref 78.0–100.0)
MONOS PCT: 4 %
Monocytes Absolute: 0.6 10*3/uL (ref 0.1–1.0)
NEUTROS PCT: 62 %
Neutro Abs: 9.2 10*3/uL — ABNORMAL HIGH (ref 1.7–7.7)
PLATELETS: 452 10*3/uL — AB (ref 150–400)
RBC: 4 MIL/uL (ref 3.87–5.11)
RDW: 14.6 % (ref 11.5–15.5)
WBC: 14.9 10*3/uL — AB (ref 4.0–10.5)

## 2015-08-04 LAB — COMPREHENSIVE METABOLIC PANEL
ALBUMIN: 4.3 g/dL (ref 3.5–5.0)
ALT: 14 U/L (ref 14–54)
AST: 16 U/L (ref 15–41)
Alkaline Phosphatase: 88 U/L (ref 38–126)
Anion gap: 8 (ref 5–15)
BUN: 17 mg/dL (ref 6–20)
CHLORIDE: 105 mmol/L (ref 101–111)
CO2: 24 mmol/L (ref 22–32)
CREATININE: 1.04 mg/dL — AB (ref 0.44–1.00)
Calcium: 9.3 mg/dL (ref 8.9–10.3)
GFR calc Af Amer: >60 mL/min (ref >60)
GLUCOSE: 94 mg/dL (ref 65–99)
POTASSIUM: 4.2 mmol/L (ref 3.5–5.1)
SODIUM: 137 mmol/L (ref 135–145)
Total Bilirubin: 0.6 mg/dL (ref 0.3–1.2)
Total Protein: 7.9 g/dL (ref 6.5–8.1)

## 2015-08-04 LAB — URINALYSIS, ROUTINE W REFLEX MICROSCOPIC
Bilirubin Urine: NEGATIVE
GLUCOSE, UA: NEGATIVE mg/dL
Hgb urine dipstick: NEGATIVE
KETONES UR: NEGATIVE mg/dL
LEUKOCYTES UA: NEGATIVE
NITRITE: NEGATIVE
PH: 6 (ref 5.0–8.0)
PROTEIN: NEGATIVE mg/dL
Specific Gravity, Urine: 1.014 (ref 1.005–1.030)

## 2015-08-04 LAB — I-STAT BETA HCG BLOOD, ED (MC, WL, AP ONLY): I-stat hCG, quantitative: <5 m[IU]/mL (ref <5)

## 2015-08-04 LAB — I-STAT TROPONIN, ED: TROPONIN I, POC: 0 ng/mL (ref 0.00–0.08)

## 2015-08-04 MED ORDER — HYDROMORPHONE HCL 1 MG/ML IJ SOLN
1.0000 mg | Freq: Once | INTRAMUSCULAR | Status: AC
  Administered 2015-08-04: 1 mg via INTRAVENOUS
  Filled 2015-08-04: qty 1

## 2015-08-04 MED ORDER — MECLIZINE HCL 25 MG PO TABS: 25.0000 mg | ORAL_TABLET | Freq: Three times a day (TID) | ORAL | 0 refills | Status: DC | PRN

## 2015-08-04 MED ORDER — SODIUM CHLORIDE 0.9 % IV BOLUS (SEPSIS)
1000.0000 mL | Freq: Once | INTRAVENOUS | Status: AC
  Administered 2015-08-04: 1000 mL via INTRAVENOUS

## 2015-08-04 MED ORDER — HYDROMORPHONE HCL 2 MG/ML IJ SOLN
2.0000 mg | Freq: Once | INTRAMUSCULAR | Status: DC
Start: 2015-08-04 — End: 2015-08-04
  Filled 2015-08-04: qty 1

## 2015-08-04 MED ORDER — MELOXICAM 15 MG PO TABS: 15.0000 mg | ORAL_TABLET | Freq: Every day | ORAL | 0 refills | Status: DC

## 2015-08-04 MED ORDER — ONDANSETRON HCL 4 MG PO TABS: 4.0000 mg | ORAL_TABLET | Freq: Four times a day (QID) | ORAL | 0 refills | Status: DC

## 2015-08-04 MED ORDER — KETOROLAC TROMETHAMINE 30 MG/ML IJ SOLN
30.0000 mg | Freq: Once | INTRAMUSCULAR | Status: AC
  Administered 2015-08-04: 30 mg via INTRAVENOUS
  Filled 2015-08-04: qty 1

## 2015-08-04 MED ORDER — MECLIZINE HCL 25 MG PO TABS
50.0000 mg | ORAL_TABLET | Freq: Once | ORAL | Status: AC
  Administered 2015-08-04: 50 mg via ORAL
  Filled 2015-08-04: qty 2

## 2015-08-04 NOTE — ED Provider Notes (Signed)
[facility]-EMERGENCY DEPT Provider Note   CSN: [id number] Arrival date & time: 08/04/15  1017  First Provider Contact:  First MD Initiated Contact with Patient 08/04/15 1102        History   Chief Complaint Chief Complaint  Patient presents with  . Fall  . Dizziness    HPI Judith Hardy is a 45 y.o. female.  HPI Judith Hardy is a 45 y.o. female with hx of chronic back pain, left hip pain and replacement 6mon ago, who presents to ED with complaint of dizziness and a fall. Pt reports being dizzy over the last several days. Patient states this morning she was walking up the stairs and became dizzy. She states that she fell backwards down 5 stairs, witnessed by her husband. Patient reports that she is not sure if she lost consciousness completely but remembers the fall. She states she fell back and onto her left side. She did not hit her head. She  describes her dizziness as everything spinning around. She states is worse with position change and when she stands up. She reports not drinking as much water recently because she has been spending her time upstairs in her bedroom and unable to go up and down the stairs easily to get more water. She states that she had left hip replacement 6 months ago and has had left-sided sciatica for multiple years, post 2 lumbar surgeries. She is followed by orthopedic surgeon and by pain management. She states since the fall she hasn't been able to walk unassisted. Her husband helped her up and brought her to emergency department. She reports worsening numbness in the left foot over last several days. She reports increased weakness in the left foot as well, but also states it is painful for her to move her foot. She denies any trouble controlling her bowels or bladder. She denies any saddle paresthesia. She states she did not take her morning pain medications because she takes it after eating breakfast. She did take all her other medications.  Past Medical History:    Diagnosis Date  . Anxiety   . Arthritis   . Bursitis   . Chronic lower back pain   . Depression   . Osteoarthritis   . Pneumonia 12/16  . Sciatica     Patient Active Problem List   Diagnosis Date Noted  . Primary osteoarthritis of left hip 03/08/2015  . CAP (community acquired pneumonia) 12/24/2014  . Nausea & vomiting 12/24/2014  . Postoperative abdominal pain 05/27/2014  . HNP (herniated nucleus pulposus), lumbar 08/02/2011    Past Surgical History:  Procedure Laterality Date  . APPENDECTOMY  2011  . BACK SURGERY    . HERNIA REPAIR  4/16   umbilical  . JOINT REPLACEMENT    . LUMBAR LAMINECTOMY/DECOMPRESSION MICRODISCECTOMY  01/10/2011   Procedure: LUMBAR LAMINECTOMY/DECOMPRESSION MICRODISCECTOMY;  Surgeon: Javier Docker;  Location: [facility];  Service: Orthopedics;  Laterality: N/A;  Decompression L4 - L5  (X-Ray)  . LUMBAR LAMINECTOMY/DECOMPRESSION MICRODISCECTOMY  08/02/2011   Procedure: LUMBAR LAMINECTOMY/DECOMPRESSION MICRODISCECTOMY;  Surgeon: Javier Docker, MD;  Location: [facility];  Service: Orthopedics;  Laterality: N/A;  Re-do Decompression L4-L5  . TOTAL HIP ARTHROPLASTY Left 03/08/2015   ANTERIOR APPROACH  . TOTAL HIP ARTHROPLASTY Left 03/08/2015   Procedure: TOTAL HIP ARTHROPLASTY ANTERIOR APPROACH;  Surgeon: Marcene Corning, MD;  Location: [facility] OR;  Service: Orthopedics;  Laterality: Left;    OB History    No data available       Home Medications  Prior to Admission medications   Medication Sig Start Date End Date Taking? Authorizing Provider  aspirin EC 325 MG EC tablet Take 1 tablet (325 mg total) by mouth 2 (two) times daily after a meal. Patient not taking: Reported on 05/20/2015 03/10/15   [name], PA-C  cephALEXin (KEFLEX) 500 MG capsule Take 1 capsule (500 mg total) by mouth 4 (four) times daily. Patient not taking: Reported on 05/28/2015 05/20/15   [name], MD  clonazePAM (KLONOPIN) 1 MG tablet Take 1 mg by mouth 2 (two) times daily.     Historical Provider, MD  gabapentin (NEURONTIN) 300 MG capsule Take 600 mg by mouth at bedtime. Take two (2) 300 mg tablets to = 600 mg qHS    Historical Provider, MD  Chilton Si Tea POWD Take 8 oz by mouth daily with breakfast.    Historical Provider, MD  HYDROmorphone (DILAUDID) 4 MG tablet Take 1 tablet (4 mg total) by mouth every 6 (six) hours as needed for severe pain. Patient taking differently: Take 4 mg by mouth 4 (four) times daily.  05/20/15   [name], MD  ibuprofen (ADVIL,MOTRIN) 200 MG tablet Take 800 mg by mouth every 6 (six) hours as needed for fever, headache, mild pain, moderate pain or cramping.    Historical Provider, MD  LYSINE PO Take 3 tablets by mouth daily with breakfast.    Historical Provider, MD  methocarbamol (ROBAXIN) 500 MG tablet Take 1 tablet (500 mg total) by mouth every 6 (six) hours as needed for muscle spasms. Patient not taking: Reported on 05/20/2015 03/10/15   [name], PA-C  methocarbamol (ROBAXIN) 750 MG tablet Take 750 mg by mouth 2 (two) times daily as needed for muscle spasms.     Historical Provider, MD  ondansetron (ZOFRAN ODT) 4 MG disintegrating tablet 4mg  ODT q4 hours prn nausea/vomit Patient not taking: Reported on 05/28/2015 05/20/15   [name], MD  predniSONE (STERAPRED UNI-PAK 21 TAB) 10 MG (21) TBPK tablet Take 1 tablet (10 mg total) by mouth daily. Take 6 tabs by mouth daily  for 2 days, then 5 tabs for 2 days, then 4 tabs for 2 days, then 3 tabs for 2 days, 2 tabs for 2 days, then 1 tab by mouth daily for 2 days Patient not taking: Reported on 07/29/2015 07/08/15   Lavera Guise, MD  traZODone (DESYREL) 150 MG tablet Take 225 mg by mouth at bedtime.  03/04/15   Historical Provider, MD  venlafaxine XR (EFFEXOR-XR) 150 MG 24 hr capsule Take 300 mg by mouth daily with breakfast.  09/21/14   Historical Provider, MD    Family History Family History  Problem Relation Age of Onset  . Heart failure Mother     Social History Social History    Substance Use Topics  . Smoking status: Never Smoker  . Smokeless tobacco: Never Used     Comment: tried smoking 3 days 2012  . Alcohol use No     Allergies   Acetaminophen; Other; Tramadol; and Hydrocodone   Review of Systems Review of Systems  Constitutional: Negative for chills and fever.  Respiratory: Negative for cough, chest tightness and shortness of breath.   Cardiovascular: Negative for chest pain, palpitations and leg swelling.  Gastrointestinal: Negative for abdominal pain, diarrhea, nausea and vomiting.  Genitourinary: Negative for dysuria, flank pain, pelvic pain, vaginal bleeding, vaginal discharge and vaginal pain.  Musculoskeletal: Positive for arthralgias, back pain and gait problem. Negative for myalgias, neck pain and neck stiffness.  Skin: Negative for rash.  Neurological: Positive for dizziness, syncope and light-headedness. Negative for weakness and headaches.  All other systems reviewed and are negative.    Physical Exam Updated Vital Signs BP 100/84   Pulse 79   Temp 98.9 F (37.2 C)   Resp 16   LMP 01/15/2008   SpO2 98%   Physical Exam  Constitutional: She is oriented to person, place, and time. She appears well-developed and well-nourished. No distress.  HENT:  Head: Normocephalic.  Eyes: Conjunctivae and EOM are normal. Pupils are equal, round, and reactive to light.  Neck: Normal range of motion. Neck supple.  No midline tenderness  Cardiovascular: Normal rate, regular rhythm and normal heart sounds.   Pulmonary/Chest: Effort normal and breath sounds normal. No respiratory distress. She has no wheezes. She has no rales.  Abdominal: Soft. Bowel sounds are normal. She exhibits no distension. There is no tenderness. There is no rebound.  Musculoskeletal:  Patient is laying on the right side, unable to lay on her back due to pain at this time. Tender to palpation over midline lumbar spine and left SI joint. Tenderness extends into the left  buttock, tenderness is even to the light touch of the skin. Unable to move left hip. Limited range of motion to the left knee due to acute injury. Tenderness over anterior knee and lateral joint. Joint appears to be stable with negative anterior and posterior drawer signs. Dorsal pedal pulses intact. No tenderness of her ankle or foot joint. Decreased sensation to the dorsal foot. Patient unable to dorsiflex left toe or foot due to pain. She is able to wiggle her toes slightly.  Neurological: She is alert and oriented to person, place, and time. She has normal reflexes. No cranial nerve deficit. She exhibits normal muscle tone. Coordination normal.  Skin: Skin is warm and dry.  Psychiatric: She has a normal mood and affect. Her behavior is normal.  Nursing note and vitals reviewed.    ED Treatments / Results  Labs (all labs ordered are listed, but only abnormal results are displayed) Labs Reviewed  CBC WITH DIFFERENTIAL/PLATELET - Abnormal; Notable for the following:       Result Value   WBC 14.9 (*)    Platelets 452 (*)    Neutro Abs 9.2 (*)    Lymphs Abs 5.1 (*)    All other components within normal limits  COMPREHENSIVE METABOLIC PANEL - Abnormal; Notable for the following:    Creatinine, Ser 1.04 (*)    All other components within normal limits  URINALYSIS, ROUTINE W REFLEX MICROSCOPIC (NOT AT [facility])  I-STAT TROPOININ, ED  I-STAT BETA HCG BLOOD, ED ([facility], [facility], [facility] ONLY)    EKG  EKG Interpretation None       Radiology Dg Lumbar Spine Complete  Result Date: 08/04/2015 CLINICAL DATA:  Larey Seat down steps this morning. Left leg pain and numbness. EXAM: LUMBAR SPINE - COMPLETE 4+ VIEW COMPARISON:  07/29/2015 FINDINGS: Five lumbar type vertebral bodies show normal alignment. No evidence of fracture. No disc space narrowing. Minimal lower lumbar facet degeneration. IUD present in the pelvis. IMPRESSION: No acute or traumatic finding. Mild lower lumbar facet degeneration. Electronically  Signed   By: Paulina Fusi M.D.   On: 08/04/2015 13:09  Dg Knee Complete 4 Views Left  Result Date: 08/04/2015 CLINICAL DATA:  Pt fell down 4 steps this am. Pt reports she became dizzy, lost balance and fell. Pt reports L leg pain/numbness for the past 3 days. Hx of  hip replacement. EXAM: LEFT KNEE - COMPLETE 4+ VIEW COMPARISON:  None. FINDINGS: No fracture.  No bone lesion. Knee joint is normally spaced and aligned. There are minimal marginal osteophytes from all 3 compartments. No other arthropathic change. No joint effusion.  Soft tissues are unremarkable. IMPRESSION: 1. No fracture or acute finding. 2. Minimal degenerative change. Electronically Signed   By: Amie Portland M.D.   On: 08/04/2015 13:10  Dg Hip Unilat W Or Wo Pelvis 2-3 Views Left  Result Date: 08/04/2015 CLINICAL DATA:  Pt fell down 4 steps this am. Pt reports she became dizzy, lost balance and fell. Pt reports L leg pain/numbness for the past 3 days. Hx of hip replacement. EXAM: DG HIP (WITH OR WITHOUT PELVIS) 2-3V LEFT COMPARISON:  07/29/2015 FINDINGS: No acute fracture. Left hip total arthroplasty is well-seated and well-aligned. No evidence of loosening. No bone lesion. Right hip joint, SI joints and symphysis pubis are normally spaced and aligned. Normal soft tissues. IMPRESSION: 1. No fracture, dislocation or acute finding. 2. No evidence of loosening of the left hip orthopedic hardware. Electronically Signed   By: Amie Portland M.D.   On: 08/04/2015 13:08   Procedures Procedures (including critical care time)  Medications Ordered in ED Medications  meclizine (ANTIVERT) tablet 50 mg (50 mg Oral Given 08/04/15 1137)  HYDROmorphone (DILAUDID) injection 1 mg (1 mg Intravenous Given 08/04/15 1147)  sodium chloride 0.9 % bolus 1,000 mL (0 mLs Intravenous Stopped 08/04/15 1526)  HYDROmorphone (DILAUDID) injection 1 mg (1 mg Intravenous Given 08/04/15 1341)  HYDROmorphone (DILAUDID) injection 1 mg (1 mg Intravenous Given 08/04/15 1456)   ketorolac (TORADOL) 30 MG/ML injection 30 mg (30 mg Intravenous Given 08/04/15 1451)     Initial Impression / Assessment and Plan / ED Course  I have reviewed the triage vital signs and the nursing notes.  Pertinent labs & imaging results that were available during my care of the patient were reviewed by me and considered in my medical decision making (see chart for details).  Clinical Course  Comment By Time  Patient seen and examined. Patient has history of chronic pain, here with worsened pain over last several days and even more severe pain after a fall. Questionable syncopal episode. We will get labs, EKG, will administer pain medications. Will x-ray lumbar spine, left hip, left knee [name], PA-C 07/27 1122  Pt feels no improvement. Appears a little more comofrotable at this time. Will try another dose of pain medications and monitor.  [name], PA-C 07/27 1438  Patient was able to ambulate with some assist in the hallway. She feels somewhat better. Her dizziness improved. We'll discharge home with meclizine. Patient is also asking for prescription for NSAIDs and nausea. Will discharge home with Modic and Zofran. [name], PA-C 07/27 1536    Patient came to emergency department after getting dizzy and falling down a few steps and reinjuring her back and hip. She was treated emergency department with IV fluids, meclizine, IV Dilaudid. Her pain improved. She was able to ambulate with some assist. Her dizziness improved as well. All blood work is unremarkable except for a baseline leukocytosis and elevated platelets. NEgative ECG and trop. Doubt cardiac. No CP or SOB. Will start on meclizine. Patient is in pain management, will continue current medications at home. She did ask me to give her something for nausea and NSAIDs. Will give Zofran and Mobic. Return precautions discussed.     Final Clinical Impressions(s) / ED Diagnoses   Final  diagnoses:  Fall,  initial encounter  Vertigo  Lumbar radiculopathy    New Prescriptions New Prescriptions   MECLIZINE (ANTIVERT) 25 MG TABLET    Take 1 tablet (25 mg total) by mouth 3 (three) times daily as needed for dizziness.   MELOXICAM (MOBIC) 15 MG TABLET    Take 1 tablet (15 mg total) by mouth daily.   ONDANSETRON (ZOFRAN) 4 MG TABLET    Take 1 tablet (4 mg total) by mouth every 6 (six) hours.     [name], PA-C 08/04/15 2158    Alvira Monday, MD 08/05/15 [phone]

## 2015-08-04 NOTE — Discharge Instructions (Signed)
Take meclizine as needed for dizziness. Take mobic for inflammation and pain. Take zofran as needed for nausea. Continue your regular medications. Follow up with your back doctor for recheck and further treatment. Follow up with primary care doctor for vertigo. Return if worsening.

## 2015-08-04 NOTE — ED Notes (Signed)
Assisted patient ambulating in hallway.  Patient normally uses cane with ambulation but left it in car.  Patient used RN on one side for assistance and then held on to wall railing with other hand.  Patient would at times drag left foot. When asking patient about foot dragging, patient states that when her pain is really bad like it is, then it will drag.   Made Tatyana PA aware.

## 2015-08-04 NOTE — ED Notes (Signed)
Patient transported to X-ray 

## 2015-08-04 NOTE — ED Triage Notes (Signed)
Pt fell down 4 steps this am. Pt reports she became dizzy, lost balance and fell. Pt reports L leg pain/numbness for the past 3 days. Hx of hip replacement. Pt does not think she hit her head.

## 2015-08-06 ENCOUNTER — Encounter (HOSPITAL_COMMUNITY): Payer: Self-pay

## 2015-08-06 ENCOUNTER — Emergency Department (HOSPITAL_COMMUNITY)
Admission: EM | Admit: 2015-08-06 | Discharge: 2015-08-06 | Disposition: A | Discharge status: Home / Self Care | Payer: Medicare HMO | Attending: Emergency Medicine | Admitting: Emergency Medicine

## 2015-08-06 DIAGNOSIS — Z791 Long term (current) use of non-steroidal anti-inflammatories (NSAID): Secondary | ICD-10-CM | POA: Insufficient documentation

## 2015-08-06 DIAGNOSIS — Y929 Unspecified place or not applicable: Secondary | ICD-10-CM | POA: Diagnosis not present

## 2015-08-06 DIAGNOSIS — Z79899 Other long term (current) drug therapy: Secondary | ICD-10-CM | POA: Insufficient documentation

## 2015-08-06 DIAGNOSIS — M545 Low back pain, unspecified: Secondary | ICD-10-CM

## 2015-08-06 DIAGNOSIS — Y999 Unspecified external cause status: Secondary | ICD-10-CM | POA: Insufficient documentation

## 2015-08-06 DIAGNOSIS — Z7982 Long term (current) use of aspirin: Secondary | ICD-10-CM | POA: Diagnosis not present

## 2015-08-06 DIAGNOSIS — Y939 Activity, unspecified: Secondary | ICD-10-CM | POA: Insufficient documentation

## 2015-08-06 DIAGNOSIS — W109XXA Fall (on) (from) unspecified stairs and steps, initial encounter: Secondary | ICD-10-CM | POA: Insufficient documentation

## 2015-08-06 DIAGNOSIS — G8929 Other chronic pain: Secondary | ICD-10-CM

## 2015-08-06 MED ORDER — DIPHENHYDRAMINE HCL 50 MG/ML IJ SOLN
12.5000 mg | Freq: Once | INTRAMUSCULAR | Status: AC
  Administered 2015-08-06: 12.5 mg via INTRAVENOUS
  Filled 2015-08-06: qty 1

## 2015-08-06 MED ORDER — HYDROMORPHONE HCL 2 MG/ML IJ SOLN
2.0000 mg | Freq: Once | INTRAMUSCULAR | Status: AC
  Administered 2015-08-06: 2 mg via INTRAVENOUS
  Filled 2015-08-06: qty 1

## 2015-08-06 MED ORDER — SODIUM CHLORIDE 0.9 % IV BOLUS (SEPSIS)
1000.0000 mL | Freq: Once | INTRAVENOUS | Status: AC
  Administered 2015-08-06: 1000 mL via INTRAVENOUS

## 2015-08-06 MED ORDER — PROCHLORPERAZINE EDISYLATE 5 MG/ML IJ SOLN
5.0000 mg | Freq: Once | INTRAMUSCULAR | Status: AC
  Administered 2015-08-06: 5 mg via INTRAMUSCULAR
  Filled 2015-08-06: qty 2

## 2015-08-06 MED ORDER — SODIUM CHLORIDE 0.9 % IV BOLUS (SEPSIS): 1000.0000 mL | Freq: Once | INTRAVENOUS | Status: DC

## 2015-08-06 MED ORDER — DEXAMETHASONE SODIUM PHOSPHATE 10 MG/ML IJ SOLN
10.0000 mg | Freq: Once | INTRAMUSCULAR | Status: AC
  Administered 2015-08-06: 10 mg via INTRAVENOUS
  Filled 2015-08-06: qty 1

## 2015-08-06 NOTE — ED Triage Notes (Signed)
She states she fell two days ago; fell backward down stairs.  Today she c/o low back pain and left hip/leg pain.  She is oriented x 4 and in no distress.

## 2015-08-06 NOTE — ED Notes (Signed)
Pt walked per her norm 

## 2015-08-06 NOTE — Discharge Instructions (Signed)
SEEK IMMEDIATE MEDICAL ATTENTION IF: °New numbness, tingling, weakness, or problem with the use of your arms or legs.  °Severe back pain not relieved with medications.  °Change in bowel or bladder control.  °Increasing pain in any areas of the body (such as chest or abdominal pain).  °Shortness of breath, dizziness or fainting.  °Nausea (feeling sick to your stomach), vomiting, fever, or sweats. ° ° °Chronic Pain Discharge Instructions  °Emergency care providers appreciate that many patients coming to us are in severe pain and we wish to address their pain in the safest, most responsible manner.  It is important to recognize however, that the proper treatment of chronic pain differs from that of the pain of injuries and acute illnesses.  Our goal is to provide quality, safe, personalized care and we thank you for giving us the opportunity to serve you. °The use of narcotics and related agents for chronic pain syndromes may lead to additional physical and psychological problems.  Nearly as many people die from prescription narcotics each year as die from car crashes.  Additionally, this risk is increased if such prescriptions are obtained from a variety of sources.  Therefore, only your primary care physician or a pain management specialist is able to safely treat such syndromes with narcotic medications long-term.   ° °Documentation revealing such prescriptions have been sought from multiple sources may prohibit us from providing a refill or different narcotic medication.  Your name may be checked first through the [place] Controlled Substances Reporting System.  This database is a record of controlled substance medication prescriptions that the patient has received.  This has been established by [place] in an effort to eliminate the dangerous, and often life threatening, practice of obtaining multiple prescriptions from different medical providers.  ° °If you have a chronic pain syndrome (i.e. chronic  headaches, recurrent back or neck pain, dental pain, abdominal or pelvis pain without a specific diagnosis, or neuropathic pain such as fibromyalgia) or recurrent visits for the same condition without an acute diagnosis, you may be treated with non-narcotics and other non-addictive medicines.  Allergic reactions or negative side effects that may be reported by a patient to such medications will not typically lead to the use of a narcotic analgesic or other controlled substance as an alternative. °  °Patients managing chronic pain with a personal physician should have provisions in place for breakthrough pain.  If you are in crisis, you should call your physician.  If your physician directs you to the emergency department, please have the doctor call and speak to our attending physician concerning your care. °  °When patients come to the Emergency Department (ED) with acute medical conditions in which the Emergency Department physician feels appropriate to prescribe narcotic or sedating pain medication, the physician will prescribe these in very limited quantities.  The amount of these medications will last only until you can see your primary care physician in his/her office.  Any patient who returns to the ED seeking refills should expect only non-narcotic pain medications.  ° °In the event of an acute medical condition exists and the emergency physician feels it is necessary that the patient be given a narcotic or sedating medication -  a responsible adult driver should be present in the room prior to the medication being given by the nurse. °  °Prescriptions for narcotic or sedating medications that have been lost, stolen or expired will not be refilled in the Emergency Department.   ° °Patients who   have chronic pain may receive non-narcotic prescriptions until seen by their primary care physician.  It is every patient’s personal responsibility to maintain active prescriptions with his or her primary care  physician or specialist. ° °

## 2015-08-06 NOTE — ED Provider Notes (Signed)
[facility]-EMERGENCY DEPT Provider Note   CSN: [id number] Arrival date & time: 08/06/15  1326  First Provider Contact:  First MD Initiated Contact with Patient 08/06/15 1500        History   Chief Complaint Chief Complaint  Patient presents with  . Fall    HPI Judith Hardy is a 45 y.o. female who presents emergency Department with chief complaint of chronic pain exacerbation. 2. 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 She is followed by pain management. Patient addresses this upfront. She was seen 2 days ago after she fell down stairs. She is discharged with naproxen after negative workup for intermittent dizziness. She denies any dizziness at this time but states that her pain is uncontrolled on no medications. She is on 4 mg Dilaudid at home. She denies any new neurologic deficits. She has supplemented with naproxen without relief. Denies new weakness, loss of bowel/bladder function or saddle anesthesia. Denies neck stiffness, headache, rash.  Denies fever or recent procedures to back. She presents for pain control alone.   HPI  Past Medical History:  Diagnosis Date  . Anxiety   . Arthritis   . Bursitis   . Chronic lower back pain   . Depression   . Osteoarthritis   . Pneumonia 12/16  . Sciatica     Patient Active Problem List   Diagnosis Date Noted  . Primary osteoarthritis of left hip 03/08/2015  . CAP (community acquired pneumonia) 12/24/2014  . Nausea & vomiting 12/24/2014  . Postoperative abdominal pain 05/27/2014  . HNP (herniated nucleus pulposus), lumbar 08/02/2011    Past Surgical History:  Procedure Laterality Date  . APPENDECTOMY  2011  . BACK SURGERY    . HERNIA REPAIR  4/16   umbilical  . JOINT REPLACEMENT    . LUMBAR LAMINECTOMY/DECOMPRESSION MICRODISCECTOMY  01/10/2011   Procedure: LUMBAR LAMINECTOMY/DECOMPRESSION MICRODISCECTOMY;  Surgeon: Javier Docker;  Location: [facility];  Service: Orthopedics;  Laterality: N/A;   Decompression L4 - L5  (X-Ray)  . LUMBAR LAMINECTOMY/DECOMPRESSION MICRODISCECTOMY  08/02/2011   Procedure: LUMBAR LAMINECTOMY/DECOMPRESSION MICRODISCECTOMY;  Surgeon: Javier Docker, MD;  Location: [facility];  Service: Orthopedics;  Laterality: N/A;  Re-do Decompression L4-L5  . TOTAL HIP ARTHROPLASTY Left 03/08/2015   ANTERIOR APPROACH  . TOTAL HIP ARTHROPLASTY Left 03/08/2015   Procedure: TOTAL HIP ARTHROPLASTY ANTERIOR APPROACH;  Surgeon: Marcene Corning, MD;  Location: [facility];  Service: Orthopedics;  Laterality: Left;    OB History    No data available       Home Medications    Prior to Admission medications   Medication Sig Start Date End Date Taking? Authorizing Provider  clonazePAM (KLONOPIN) 1 MG tablet Take 1 mg by mouth 2 (two) times daily.   Yes Historical Provider, MD  gabapentin (NEURONTIN) 300 MG capsule Take 600 mg by mouth at bedtime. Take two (2) 300 mg tablets to = 600 mg qHS   Yes Historical Provider, MD  Chilton Si Tea POWD Take 8 oz by mouth daily with breakfast.   Yes Historical Provider, MD  HYDROmorphone (DILAUDID) 4 MG tablet Take 1 tablet (4 mg total) by mouth every 6 (six) hours as needed for severe pain. 05/20/15  Yes [name], MD  ibuprofen (ADVIL,MOTRIN) 200 MG tablet Take 800 mg by mouth every 6 (six) hours as needed for fever, headache, mild pain, moderate pain or cramping.   Yes Historical Provider, MD  LYSINE PO Take 3 tablets by mouth daily with breakfast.  Yes Historical Provider, MD  meclizine (ANTIVERT) 25 MG tablet Take 1 tablet (25 mg total) by mouth 3 (three) times daily as needed for dizziness. 08/04/15  Yes [name], PA-C  methocarbamol (ROBAXIN) 750 MG tablet Take 750 mg by mouth 2 (two) times daily as needed for muscle spasms.    Yes Historical Provider, MD  ondansetron (ZOFRAN ODT) 4 MG disintegrating tablet 4mg  ODT q4 hours prn nausea/vomit Patient taking differently: Take 4 mg by mouth every 6 (six) hours as needed for nausea or vomiting.   05/20/15  Yes [name], MD  traZODone (DESYREL) 150 MG tablet Take 225 mg by mouth at bedtime.  03/04/15  Yes Historical Provider, MD  venlafaxine XR (EFFEXOR-XR) 150 MG 24 hr capsule Take 300 mg by mouth daily with breakfast.  09/21/14  Yes Historical Provider, MD  aspirin EC 325 MG EC tablet Take 1 tablet (325 mg total) by mouth 2 (two) times daily after a meal. Patient not taking: Reported on 05/20/2015 03/10/15   [name], PA-C  cephALEXin (KEFLEX) 500 MG capsule Take 1 capsule (500 mg total) by mouth 4 (four) times daily. Patient not taking: Reported on 05/28/2015 05/20/15   [name], MD  meloxicam (MOBIC) 15 MG tablet Take 1 tablet (15 mg total) by mouth daily. Patient not taking: Reported on 08/06/2015 08/04/15   [name], PA-C  methocarbamol (ROBAXIN) 500 MG tablet Take 1 tablet (500 mg total) by mouth every 6 (six) hours as needed for muscle spasms. Patient not taking: Reported on 08/06/2015 03/10/15   [name], PA-C  ondansetron (ZOFRAN) 4 MG tablet Take 1 tablet (4 mg total) by mouth every 6 (six) hours. Patient not taking: Reported on 08/06/2015 08/04/15   [name], PA-C  predniSONE (STERAPRED UNI-PAK 21 TAB) 10 MG (21) TBPK tablet Take 1 tablet (10 mg total) by mouth daily. Take 6 tabs by mouth daily  for 2 days, then 5 tabs for 2 days, then 4 tabs for 2 days, then 3 tabs for 2 days, 2 tabs for 2 days, then 1 tab by mouth daily for 2 days Patient not taking: Reported on 07/29/2015 07/08/15   Lavera Guise, MD    Family History Family History  Problem Relation Age of Onset  . Heart failure Mother     Social History Social History  Substance Use Topics  . Smoking status: Never Smoker  . Smokeless tobacco: Never Used     Comment: tried smoking 3 days 2012  . Alcohol use No     Allergies   Acetaminophen; Other; Tramadol; and Hydrocodone   Review of Systems Review of Systems  Ten systems reviewed and are negative for acute change, except as noted in  the HPI.  Physical Exam Updated Vital Signs BP 125/91 (BP Location: Left Arm)   Pulse 62   Temp 98.2 F (36.8 C) (Oral)   Resp 16   LMP 01/15/2008   SpO2 100%   Physical Exam  Constitutional: She is oriented to person, place, and time. She appears well-developed and well-nourished. No distress.  Appears uncomfortable  HENT:  Head: Normocephalic and atraumatic.  Eyes: Conjunctivae are normal. No scleral icterus.  Neck: Normal range of motion.  Cardiovascular: Normal rate, regular rhythm and normal heart sounds.  Exam reveals no gallop and no friction rub.   No murmur heard. Pulmonary/Chest: Effort normal and breath sounds normal. No respiratory distress.  Abdominal: Soft. Bowel sounds are normal. She exhibits no distension and no mass. There is no  tenderness. There is no guarding.  Musculoskeletal:  Patient is exquisitely tender to even light touch of her skin. She has no midline tenderness. She has exquisite tenderness to palpation of the left lumbar paraspinal muscles and gluteal muscles with a positive straight leg test. She winces intermittently in pain. Normal DTRs. Normal dorsi and plantarflexion of the feet bilaterally. Strength, sensation, and pulses are intact   Neurological: She is alert and oriented to person, place, and time.  Skin: Skin is warm and dry. She is not diaphoretic.  Nursing note and vitals reviewed.    ED Treatments / Results  Labs (all labs ordered are listed, but only abnormal results are displayed) Labs Reviewed - No data to display  EKG  EKG Interpretation None       Radiology No results found.  Procedures Procedures (including critical care time)  Medications Ordered in ED Medications  sodium chloride 0.9 % bolus 1,000 mL (1,000 mLs Intravenous New Bag/Given 08/06/15 1547)  HYDROmorphone (DILAUDID) injection 2 mg (2 mg Intravenous Given 08/06/15 1547)  dexamethasone (DECADRON) injection 10 mg (10 mg Intravenous Given 08/06/15 1547)    prochlorperazine (COMPAZINE) injection 5 mg (5 mg Intramuscular Given 08/06/15 1548)  diphenhydrAMINE (BENADRYL) injection 12.5 mg (12.5 mg Intravenous Given 08/06/15 1548)  HYDROmorphone (DILAUDID) injection 2 mg (2 mg Intravenous Given 08/06/15 1652)     Initial Impression / Assessment and Plan / ED Course  I have reviewed the triage vital signs and the nursing notes.  Pertinent labs & imaging results that were available during my care of the patient were reviewed by me and considered in my medical decision making (see chart for details).  Clinical Course    Patient here for acute on chronic pain exacerbation after fall. Patient given 2 doses of 2 mg by a clotted IV along with Compazine, Benadryl, and a shot of Decadron. Her pain is greatly improved after treatment. She ambulates in the emergency department. She appears safe for discharge at this time, Discussed return precautions.  Final Clinical Impressions(s) / ED Diagnoses   Final diagnoses:  Acute exacerbation of chronic low back pain    New Prescriptions New Prescriptions   No medications on file     Arthor Captain, PA-C 08/06/15 2316    Tilden Fossa, MD 08/07/15 1356

## 2015-08-20 ENCOUNTER — Encounter (HOSPITAL_COMMUNITY): Payer: Self-pay | Admitting: Emergency Medicine

## 2015-08-20 ENCOUNTER — Emergency Department (HOSPITAL_COMMUNITY)
Admission: EM | Admit: 2015-08-20 | Discharge: 2015-08-20 | Disposition: A | Discharge status: Home / Self Care | Payer: Medicare HMO | Attending: Emergency Medicine | Admitting: Emergency Medicine

## 2015-08-20 DIAGNOSIS — Z79899 Other long term (current) drug therapy: Secondary | ICD-10-CM | POA: Diagnosis not present

## 2015-08-20 DIAGNOSIS — M5442 Lumbago with sciatica, left side: Secondary | ICD-10-CM | POA: Insufficient documentation

## 2015-08-20 DIAGNOSIS — M545 Low back pain: Secondary | ICD-10-CM | POA: Diagnosis present

## 2015-08-20 DIAGNOSIS — M5432 Sciatica, left side: Secondary | ICD-10-CM

## 2015-08-20 MED ORDER — LORAZEPAM 2 MG/ML IJ SOLN
1.0000 mg | Freq: Once | INTRAMUSCULAR | Status: AC
  Administered 2015-08-20: 1 mg via INTRAVENOUS
  Filled 2015-08-20: qty 1

## 2015-08-20 MED ORDER — DIAZEPAM 5 MG PO TABS
10.0000 mg | ORAL_TABLET | Freq: Once | ORAL | Status: AC
  Administered 2015-08-20: 10 mg via ORAL
  Filled 2015-08-20: qty 2

## 2015-08-20 MED ORDER — SODIUM CHLORIDE 0.9 % IV SOLN
INTRAVENOUS | Status: DC
  Administered 2015-08-20: 09:00:00 via INTRAVENOUS

## 2015-08-20 MED ORDER — HYDROMORPHONE HCL 1 MG/ML IJ SOLN
1.0000 mg | Freq: Once | INTRAMUSCULAR | Status: AC
  Administered 2015-08-20: 1 mg via INTRAVENOUS
  Filled 2015-08-20: qty 1

## 2015-08-20 MED ORDER — HYDROMORPHONE HCL 1 MG/ML IJ SOLN
2.0000 mg | Freq: Once | INTRAMUSCULAR | Status: AC
  Administered 2015-08-20: 2 mg via INTRAVENOUS
  Filled 2015-08-20: qty 2

## 2015-08-20 NOTE — ED Provider Notes (Signed)
[facility] Provider Note   CSN: [id number] Arrival date & time: 08/20/15  [id number]  First Provider Contact:  None       History   Chief Complaint Chief Complaint  Patient presents with  . Back Pain  . Hip Pain  . Leg Pain    HPI Judith Hardy is a 45 y.o. female.  45 year old female with history of chronic sciatica and is followed by pain clinic presents with worsening pain to her left sciatic notch region. Denies any new injuries but states that she has been more active as of late. I have seen this patient before for similar symptoms. Niacin bowel or bladder dysfunction. No weakness to her left foot. Takes home hydromorphone 4 mg orally as well as muscle relaxants and states that this is not helped. Denies any rashes to her leg at this time. Symptoms worse with standing or movement and better with rest. Denies any history of recent falls      Past Medical History:  Diagnosis Date  . Anxiety   . Arthritis   . Bursitis   . Chronic lower back pain   . Depression   . Osteoarthritis   . Pneumonia 12/16  . Sciatica     Patient Active Problem List   Diagnosis Date Noted  . Primary osteoarthritis of left hip 03/08/2015  . CAP (community acquired pneumonia) 12/24/2014  . Nausea & vomiting 12/24/2014  . Postoperative abdominal pain 05/27/2014  . HNP (herniated nucleus pulposus), lumbar 08/02/2011    Past Surgical History:  Procedure Laterality Date  . APPENDECTOMY  2011  . BACK SURGERY    . HERNIA REPAIR  4/16   umbilical  . JOINT REPLACEMENT    . LUMBAR LAMINECTOMY/DECOMPRESSION MICRODISCECTOMY  01/10/2011   Procedure: LUMBAR LAMINECTOMY/DECOMPRESSION MICRODISCECTOMY;  Surgeon: [name];  Location: [facility];  Service: Orthopedics;  Laterality: N/A;  Decompression L4 - L5  (X-Ray)  . LUMBAR LAMINECTOMY/DECOMPRESSION MICRODISCECTOMY  08/02/2011   Procedure: LUMBAR LAMINECTOMY/DECOMPRESSION MICRODISCECTOMY;  Surgeon: [name], MD;  Location: [facility];   Service: Orthopedics;  Laterality: N/A;  Re-do Decompression L4-L5  . TOTAL HIP ARTHROPLASTY Left 03/08/2015   ANTERIOR APPROACH  . TOTAL HIP ARTHROPLASTY Left 03/08/2015   Procedure: TOTAL HIP ARTHROPLASTY ANTERIOR APPROACH;  Surgeon: [name], MD;  Location: [facility];  Service: Orthopedics;  Laterality: Left;    OB History    No data available       Home Medications    Prior to Admission medications   Medication Sig Start Date End Date Taking? Authorizing Provider  aspirin EC 325 MG EC tablet Take 1 tablet (325 mg total) by mouth 2 (two) times daily after a meal. Patient not taking: Reported on 05/20/2015 03/10/15   Elodia FlorenceAndrew Nida, PA-C  cephALEXin (KEFLEX) 500 MG capsule Take 1 capsule (500 mg total) by mouth 4 (four) times daily. Patient not taking: Reported on 05/28/2015 05/20/15   Bethann BerkshireJoseph Zammit, MD  clonazePAM (KLONOPIN) 1 MG tablet Take 1 mg by mouth 2 (two) times daily.    Historical Provider, MD  gabapentin (NEURONTIN) 300 MG capsule Take 600 mg by mouth at bedtime. Take two (2) 300 mg tablets to = 600 mg qHS    Historical Provider, MD  Chilton SiGreen Tea POWD Take 8 oz by mouth daily with breakfast.    Historical Provider, MD  HYDROmorphone (DILAUDID) 4 MG tablet Take 1 tablet (4 mg total) by mouth every 6 (six) hours as needed for severe pain. 05/20/15   Bethann BerkshireJoseph Zammit,  MD  ibuprofen (ADVIL,MOTRIN) 200 MG tablet Take 800 mg by mouth every 6 (six) hours as needed for fever, headache, mild pain, moderate pain or cramping.    Historical Provider, MD  LYSINE PO Take 3 tablets by mouth daily with breakfast.    Historical Provider, MD  meclizine (ANTIVERT) 25 MG tablet Take 1 tablet (25 mg total) by mouth 3 (three) times daily as needed for dizziness. 08/04/15   [name], PA-C  meloxicam (MOBIC) 15 MG tablet Take 1 tablet (15 mg total) by mouth daily. Patient not taking: Reported on 08/06/2015 08/04/15   [name], PA-C  methocarbamol (ROBAXIN) 500 MG tablet Take 1 tablet (500 mg  total) by mouth every 6 (six) hours as needed for muscle spasms. Patient not taking: Reported on 08/06/2015 03/10/15   [name], PA-C  methocarbamol (ROBAXIN) 750 MG tablet Take 750 mg by mouth 2 (two) times daily as needed for muscle spasms.     Historical Provider, MD  ondansetron (ZOFRAN ODT) 4 MG disintegrating tablet  ODT q4 hours prn nausea/vomit Patient taking differently: Take 4 mg by mouth every 6 (six) hours as needed for nausea or vomiting.  05/20/15   [name], MD  ondansetron (ZOFRAN) 4 MG tablet Take 1 tablet (4 mg total) by mouth every 6 (six) hours. Patient not taking: Reported on 08/06/2015 08/04/15   [name], PA-C  predniSONE (STERAPRED UNI-PAK 21 TAB) 10 MG (21) TBPK tablet Take 1 tablet (10 mg total) by mouth daily. Take 6 tabs by mouth daily  for 2 days, then 5 tabs for 2 days, then 4 tabs for 2 days, then 3 tabs for 2 days, 2 tabs for 2 days, then 1 tab by mouth daily for 2 days Patient not taking: Reported on 07/29/2015 07/08/15   Lavera Guise, MD  traZODone (DESYREL) 150 MG tablet Take 225 mg by mouth at bedtime.  03/04/15   Historical Provider, MD  venlafaxine XR (EFFEXOR-XR) 150 MG 24 hr capsule Take 300 mg by mouth daily with breakfast.  09/21/14   Historical Provider, MD    Family History Family History  Problem Relation Age of Onset  . Heart failure Mother     Social History Social History  Substance Use Topics  . Smoking status: Never Smoker  . Smokeless tobacco: Never Used     Comment: tried smoking 3 days 2012  . Alcohol use No     Allergies   Acetaminophen; Other; Tramadol; and Hydrocodone   Review of Systems Review of Systems  All other systems reviewed and are negative.    Physical Exam Updated Vital Signs BP 160/92 (BP Location: Left Arm)   Pulse (!) 127   Temp 98.4 F (36.9 C) (Oral)   Resp 24   LMP 01/15/2008   SpO2 99%   Physical Exam  Constitutional: She is oriented to person, place, and time. She appears  well-developed and well-nourished.  Non-toxic appearance. No distress.  HENT:  Head: Normocephalic and atraumatic.  Eyes: Conjunctivae, EOM and lids are normal. Pupils are equal, round, and reactive to light.  Neck: Normal range of motion. Neck supple. No tracheal deviation present. No thyroid mass present.  Cardiovascular: Normal rate, regular rhythm and normal heart sounds.  Exam reveals no gallop.   No murmur heard. Pulmonary/Chest: Effort normal and breath sounds normal. No stridor. No respiratory distress. She has no decreased breath sounds. She has no wheezes. She has no rhonchi. She has no rales.  Abdominal: Soft. Normal appearance and  bowel sounds are normal. She exhibits no distension. There is no tenderness. There is no rebound and no CVA tenderness.  Musculoskeletal: Normal range of motion. She exhibits no edema or tenderness.       Legs: Neurological: She is alert and oriented to person, place, and time. She has normal strength. No cranial nerve deficit or sensory deficit. GCS eye subscore is 4. GCS verbal subscore is 5. GCS motor subscore is 6.  Skin: Skin is warm and dry. No abrasion and no rash noted.  Psychiatric: She has a normal mood and affect. Her speech is normal and behavior is normal.  Nursing note and vitals reviewed.    ED Treatments / Results  Labs (all labs ordered are listed, but only abnormal results are displayed) Labs Reviewed - No data to display  EKG  EKG Interpretation None       Radiology No results found.  Procedures Procedures (including critical care time)  Medications Ordered in ED Medications  0.9 %  sodium chloride infusion (not administered)  HYDROmorphone (DILAUDID) injection 2 mg (not administered)  diazepam (VALIUM) tablet 10 mg (not administered)     Initial Impression / Assessment and Plan / ED Course  I have reviewed the triage vital signs and the nursing notes.  Pertinent labs & imaging results that were available during  my care of the patient were reviewed by me and considered in my medical decision making (see chart for details).  Clinical Course    Patient medicated with hydromorphone and Ativan here and feels better. No focal neurological deficits noted. Current symptoms likely result of her chronic sciatica and she'll be discharged home with follow-up with pain management  Final Clinical Impressions(s) / ED Diagnoses   Final diagnoses:  None    New Prescriptions New Prescriptions   No medications on file     Lorre Nick, MD 08/20/15 1118

## 2015-08-20 NOTE — ED Triage Notes (Signed)
Patient c/o back pain that radiates down left hip and leg.  Patient states ongoing but got worse due to being up walking around more yesterday.

## 2015-08-21 ENCOUNTER — Emergency Department (HOSPITAL_COMMUNITY): Payer: Medicare HMO

## 2015-08-21 ENCOUNTER — Encounter (HOSPITAL_COMMUNITY): Payer: Self-pay | Admitting: Emergency Medicine

## 2015-08-21 ENCOUNTER — Emergency Department (HOSPITAL_COMMUNITY)
Admission: EM | Admit: 2015-08-21 | Discharge: 2015-08-21 | Disposition: A | Discharge status: Home / Self Care | Payer: Medicare HMO | Attending: Emergency Medicine | Admitting: Emergency Medicine

## 2015-08-21 DIAGNOSIS — M25552 Pain in left hip: Secondary | ICD-10-CM | POA: Diagnosis present

## 2015-08-21 DIAGNOSIS — Y9289 Other specified places as the place of occurrence of the external cause: Secondary | ICD-10-CM | POA: Insufficient documentation

## 2015-08-21 DIAGNOSIS — Y9389 Activity, other specified: Secondary | ICD-10-CM | POA: Diagnosis not present

## 2015-08-21 DIAGNOSIS — Z79899 Other long term (current) drug therapy: Secondary | ICD-10-CM | POA: Diagnosis not present

## 2015-08-21 DIAGNOSIS — Y999 Unspecified external cause status: Secondary | ICD-10-CM | POA: Insufficient documentation

## 2015-08-21 DIAGNOSIS — W108XXA Fall (on) (from) other stairs and steps, initial encounter: Secondary | ICD-10-CM | POA: Insufficient documentation

## 2015-08-21 MED ORDER — DIAZEPAM 5 MG PO TABS
10.0000 mg | ORAL_TABLET | Freq: Once | ORAL | Status: AC
  Administered 2015-08-21: 10 mg via ORAL
  Filled 2015-08-21: qty 2

## 2015-08-21 MED ORDER — KETOROLAC TROMETHAMINE 30 MG/ML IJ SOLN
30.0000 mg | Freq: Once | INTRAMUSCULAR | Status: AC
  Administered 2015-08-21: 30 mg via INTRAMUSCULAR
  Filled 2015-08-21: qty 1

## 2015-08-21 MED ORDER — HYDROMORPHONE HCL 1 MG/ML IJ SOLN
1.0000 mg | Freq: Once | INTRAMUSCULAR | Status: AC
  Administered 2015-08-21: 1 mg via INTRAMUSCULAR
  Filled 2015-08-21: qty 1

## 2015-08-21 NOTE — Discharge Instructions (Signed)
Please read and follow all provided instructions.  Your diagnoses today include:  1. Left hip pain    Tests performed today include: Vital signs. See below for your results today.   Medications prescribed:  Take as prescribed   Home care instructions:  Follow any educational materials contained in this packet.  Follow-up instructions: Please follow-up with your primary care provider for further evaluation of symptoms and treatment   Return instructions:  Please return to the Emergency Department if you do not get better, if you get worse, or new symptoms OR  - Fever (temperature greater than 101.93F)  - Bleeding that does not stop with holding pressure to the area    -Severe pain (please note that you may be more sore the day after your accident)  - Chest Pain  - Difficulty breathing  - Severe nausea or vomiting  - Inability to tolerate food and liquids  - Passing out  - Skin becoming red around your wounds  - Change in mental status (confusion or lethargy)  - New numbness or weakness    Please return if you have any other emergent concerns.  Additional Information:  Your vital signs today were: BP 125/84    Pulse 92    Temp 98.9 F (37.2 C) (Oral)    Resp 20    LMP 01/15/2008 Comment: pt signed pregnancy waiver   SpO2 96%  If your blood pressure (BP) was elevated above 135/85 this visit, please have this repeated by your doctor within one month. ---------------

## 2015-08-21 NOTE — ED Provider Notes (Signed)
[facility]-EMERGENCY DEPT Provider Note   CSN: [id number] Arrival date & time: 08/21/15  1258  First Provider Contact:   First MD Initiated Contact with Patient 08/21/15 1322    By signing my name below, I, Placido Sou, attest that this documentation has been prepared under the direction and in the presence of Audry Pili, PA-C. Electronically Signed: Placido Sou, ED Scribe. 08/21/15. 1:31 PM.   History   Chief Complaint Chief Complaint  Patient presents with  . Fall  . Hip Pain    HPI HPI Comments: Judith Hardy is a 45 y.o. female with a history of chronic sciatica since 2012 and is followed by a pain clinic who presents to the Emergency Department by ambulance complaining of a fall that occurred 1 hour ago. Pt states she was going down a set of wooden stairs into her garage and due to an occurrence of sudden weakness in her LLE fell forward to the ground. Pt states she rolled her left ankle during the fall and landed on her left knee resulting in moderate pain to both regions. She additionally reports pain from her chronic sciatica which begins in her left inguinal region and radiates down her LLE as well as LLE numbness which she denies has worsened since her fall. Pt take hydromorphone 4 mg for her chronic pain as well as muscle relaxants. She was evaluated yesterday for her chronic sciatica and d/c following treatment with hydromorphone and ativan and instructed to follow up with pain management. Pt confirms her listed allergies. Pt denies bowel or bladder incontinence or any other associated symptoms at this time.   The history is provided by the patient. No language interpreter was used.    Past Medical History:  Diagnosis Date  . Anxiety   . Arthritis   . Bursitis   . Chronic lower back pain   . Depression   . Osteoarthritis   . Pneumonia 12/16  . Sciatica     Patient Active Problem List   Diagnosis Date Noted  . Primary osteoarthritis of left hip 03/08/2015  . CAP  (community acquired pneumonia) 12/24/2014  . Nausea & vomiting 12/24/2014  . Postoperative abdominal pain 05/27/2014  . HNP (herniated nucleus pulposus), lumbar 08/02/2011    Past Surgical History:  Procedure Laterality Date  . APPENDECTOMY  2011  . BACK SURGERY    . HERNIA REPAIR  4/16   umbilical  . JOINT REPLACEMENT    . LUMBAR LAMINECTOMY/DECOMPRESSION MICRODISCECTOMY  01/10/2011   Procedure: LUMBAR LAMINECTOMY/DECOMPRESSION MICRODISCECTOMY;  Surgeon: Javier Docker;  Location: [facility];  Service: Orthopedics;  Laterality: N/A;  Decompression L4 - L5  (X-Ray)  . LUMBAR LAMINECTOMY/DECOMPRESSION MICRODISCECTOMY  08/02/2011   Procedure: LUMBAR LAMINECTOMY/DECOMPRESSION MICRODISCECTOMY;  Surgeon: Javier Docker, MD;  Location: [facility];  Service: Orthopedics;  Laterality: N/A;  Re-do Decompression L4-L5  . TOTAL HIP ARTHROPLASTY Left 03/08/2015   ANTERIOR APPROACH  . TOTAL HIP ARTHROPLASTY Left 03/08/2015   Procedure: TOTAL HIP ARTHROPLASTY ANTERIOR APPROACH;  Surgeon: Marcene Corning, MD;  Location: [facility];  Service: Orthopedics;  Laterality: Left;    OB History    No data available      Home Medications    Prior to Admission medications   Medication Sig Start Date End Date Taking? Authorizing Provider  aspirin EC 325 MG EC tablet Take 1 tablet (325 mg total) by mouth 2 (two) times daily after a meal. Patient not taking: Reported on 05/20/2015 03/10/15   [name], PA-C  cephALEXin [facility]) 500  MG capsule Take 1 capsule (500 mg total) by mouth 4 (four) times daily. Patient not taking: Reported on 05/28/2015 05/20/15   [name], MD  clonazePAM (KLONOPIN) 1 MG tablet Take 1 mg by mouth 2 (two) times daily.    Historical Provider, MD  gabapentin (NEURONTIN) 300 MG capsule Take 600 mg by mouth at bedtime. Take two (2) 300 mg tablets to = 600 mg qHS    Historical Provider, MD  Chilton Si Tea POWD Take 8 oz by mouth daily with breakfast.    Historical Provider, MD  HYDROmorphone (DILAUDID) 4  MG tablet Take 1 tablet (4 mg total) by mouth every 6 (six) hours as needed for severe pain. 05/20/15   [name], MD  ibuprofen (ADVIL,MOTRIN) 200 MG tablet Take 800 mg by mouth every 6 (six) hours as needed for fever, headache, mild pain, moderate pain or cramping.    Historical Provider, MD  LYSINE PO Take 3 tablets by mouth daily with breakfast.    Historical Provider, MD  meclizine (ANTIVERT) 25 MG tablet Take 1 tablet (25 mg total) by mouth 3 (three) times daily as needed for dizziness. Patient not taking: Reported on 08/20/2015 08/04/15   [name], PA-C  meloxicam (MOBIC) 15 MG tablet Take 1 tablet (15 mg total) by mouth daily. 08/04/15   [name], PA-C  methocarbamol (ROBAXIN) 500 MG tablet Take 1 tablet (500 mg total) by mouth every 6 (six) hours as needed for muscle spasms. Patient not taking: Reported on 08/06/2015 03/10/15   [name], PA-C  methocarbamol (ROBAXIN) 750 MG tablet Take 750 mg by mouth 2 (two) times daily as needed for muscle spasms.     Historical Provider, MD  ondansetron (ZOFRAN ODT) 4 MG disintegrating tablet  ODT q4 hours prn nausea/vomit Patient taking differently: Take 4 mg by mouth every 6 (six) hours as needed for nausea or vomiting.  05/20/15   [name], MD  ondansetron (ZOFRAN) 4 MG tablet Take 1 tablet (4 mg total) by mouth every 6 (six) hours. Patient not taking: Reported on 08/20/2015 08/04/15   [name], PA-C  predniSONE (STERAPRED UNI-PAK 21 TAB) 10 MG (21) TBPK tablet Take 1 tablet (10 mg total) by mouth daily. Take 6 tabs by mouth daily  for 2 days, then 5 tabs for 2 days, then 4 tabs for 2 days, then 3 tabs for 2 days, 2 tabs for 2 days, then 1 tab by mouth daily for 2 days Patient not taking: Reported on 07/29/2015 07/08/15   Lavera Guise, MD  traZODone (DESYREL) 150 MG tablet Take 225 mg by mouth at bedtime.  03/04/15   Historical Provider, MD  venlafaxine XR (EFFEXOR-XR) 150 MG 24 hr capsule Take 150 mg by mouth daily with  breakfast.  09/21/14   Historical Provider, MD    Family History Family History  Problem Relation Age of Onset  . Heart failure Mother     Social History Social History  Substance Use Topics  . Smoking status: Never Smoker  . Smokeless tobacco: Never Used     Comment: tried smoking 3 days 2012  . Alcohol use No     Allergies   Acetaminophen; Other; Tramadol; and Hydrocodone   Review of Systems Review of Systems  Musculoskeletal: Positive for arthralgias and myalgias.  Skin: Negative for color change and wound.  Neurological: Positive for numbness.   Physical Exam Updated Vital Signs BP 125/84   Pulse 92   Temp 98.9 F (37.2 C) (Oral)  Resp 20   LMP 01/15/2008   SpO2 96%   Physical Exam  Constitutional: She is oriented to person, place, and time. She appears well-developed and well-nourished.  HENT:  Head: Normocephalic and atraumatic.  Eyes: EOM are normal.  Neck: Normal range of motion.  Cardiovascular: Normal rate.   Pulmonary/Chest: Effort normal. No respiratory distress.  Abdominal: Soft.  Musculoskeletal: Normal range of motion.  TTP along left lateral hip along bursa. ROM limited due to pain. Neurovascularly intact with distal pulses appreciated. No midline spinous process tenderness. Positive sciatic raise on left leg.   Neurological: She is alert and oriented to person, place, and time.  Skin: Skin is warm and dry.  Psychiatric: She has a normal mood and affect.  Nursing note and vitals reviewed.  ED Treatments / Results  Labs (all labs ordered are listed, but only abnormal results are displayed) Labs Reviewed  POC URINE PREG, ED    EKG  EKG Interpretation None      Radiology Dg Hip Unilat W Or Wo Pelvis 2-3 Views Left  Result Date: 08/21/2015 CLINICAL DATA:  Left hip pain after fall today. EXAM: DG HIP (WITH OR WITHOUT PELVIS) 2-3V LEFT COMPARISON:  08/04/2015 left hip radiographs. FINDINGS: Intrauterine device overlies the left sacrum.  Status post left total hip arthroplasty, with no evidence of hardware fracture or loosening. No left hip dislocation. No osseous fracture or suspicious focal osseous lesion. No pelvic diastasis. IMPRESSION: Status post left total hip arthroplasty, with no hardware complication. No left hip dislocation. No osseous fracture. Electronically Signed   By: Delbert PhenixJason A Poff M.D.   On: 08/21/2015 14:53    Procedures Procedures  DIAGNOSTIC STUDIES: Oxygen Saturation is 96% on RA, normal by my interpretation.    COORDINATION OF CARE: 1:29 PM Discussed next steps with pt. Pt verbalized understanding and is agreeable with the plan.    Medications Ordered in ED Medications - No data to display   Initial Impression / Assessment and Plan / ED Course  I have reviewed the triage vital signs and the nursing notes.  Pertinent labs & imaging results that were available during my care of the patient were reviewed by me and considered in my medical decision making (see chart for details).  Clinical Course     Final Clinical Impressions(s) / ED Diagnoses  I have reviewed the relevant previous healthcare records. I obtained HPI from historian.  ED Course:  Assessment: Pt is a 44yF with hx sciatic back pain who presents with left leg pain s/p fall. Noted shooting pain initially that caused fall. Hx same pain. Seen by specialists since 2012. Seen yesterday for similar exacerbation. On exam, pt in NAD. Nontoxic/nonseptic appearing. VSS. Afebrile. Left leg TTP along bursa. ROM limited due to pain. DG left hip without acute abnormalities. Stable right hip s/p arthroplasty. Given valium, toradol, dilaudid in ED. Plan is to DC home with follow up to pain clinic. At time of discharge, Patient is in no acute distress. Vital Signs are stable. Patient is able to ambulate. Patient able to tolerate PO.   Review [place] [facility] Database. Received #120 Hydromorphone tablets   Disposition/Plan:  DC Home Additional Verbal discharge  instructions given and discussed with patient.  Pt Instructed to f/u with PCP in the next week for evaluation and treatment of symptoms. Return precautions given Pt acknowledges and agrees with plan  Supervising Physician Maia PlanJoshua G Long, MD   Final diagnoses:  Left hip pain    I personally performed the services  described in this documentation, which was scribed in my presence. The recorded information has been reviewed and is accurate.   New Prescriptions New Prescriptions   No medications on file      Audry Pili, PA-C 08/21/15 1531    Maia Plan, MD 08/22/15 [phone]

## 2015-08-21 NOTE — ED Triage Notes (Signed)
Pt requesting waiver of pregnancy test prior to xray. Radiology advised

## 2015-08-21 NOTE — ED Triage Notes (Signed)
Patient presents for fall down wooden stairs. Denies LOC, didn't hit head, no anticoagulants, no loss of bladder or bowel. C/o left lower back pain radiating through left hip and pelvis. A&O x4.

## 2015-08-23 ENCOUNTER — Emergency Department (HOSPITAL_COMMUNITY)
Admission: EM | Admit: 2015-08-23 | Discharge: 2015-08-23 | Disposition: A | Discharge status: Home / Self Care | Payer: Medicare HMO | Attending: Emergency Medicine | Admitting: Emergency Medicine

## 2015-08-23 ENCOUNTER — Emergency Department (HOSPITAL_COMMUNITY): Payer: Medicare HMO

## 2015-08-23 ENCOUNTER — Encounter (HOSPITAL_COMMUNITY): Payer: Self-pay

## 2015-08-23 DIAGNOSIS — R159 Full incontinence of feces: Secondary | ICD-10-CM | POA: Diagnosis not present

## 2015-08-23 DIAGNOSIS — M549 Dorsalgia, unspecified: Secondary | ICD-10-CM

## 2015-08-23 DIAGNOSIS — Z791 Long term (current) use of non-steroidal anti-inflammatories (NSAID): Secondary | ICD-10-CM | POA: Diagnosis not present

## 2015-08-23 DIAGNOSIS — M545 Low back pain, unspecified: Secondary | ICD-10-CM

## 2015-08-23 MED ORDER — HYDROMORPHONE HCL 2 MG/ML IJ SOLN
2.0000 mg | Freq: Once | INTRAMUSCULAR | Status: AC
  Administered 2015-08-23: 2 mg via INTRAVENOUS
  Filled 2015-08-23: qty 1

## 2015-08-23 MED ORDER — IBUPROFEN 800 MG PO TABS
800.0000 mg | ORAL_TABLET | Freq: Once | ORAL | Status: AC
  Administered 2015-08-23: 800 mg via ORAL
  Filled 2015-08-23: qty 1

## 2015-08-23 MED ORDER — METHOCARBAMOL 500 MG PO TABS
500.0000 mg | ORAL_TABLET | Freq: Once | ORAL | Status: AC
  Administered 2015-08-23: 500 mg via ORAL
  Filled 2015-08-23: qty 1

## 2015-08-23 MED ORDER — HYDROMORPHONE HCL 1 MG/ML IJ SOLN
1.0000 mg | Freq: Once | INTRAMUSCULAR | Status: AC
  Administered 2015-08-23: 1 mg via INTRAVENOUS
  Filled 2015-08-23: qty 1

## 2015-08-23 MED ORDER — ONDANSETRON HCL 4 MG/2ML IJ SOLN
4.0000 mg | Freq: Once | INTRAMUSCULAR | Status: DC
  Filled 2015-08-23: qty 2

## 2015-08-23 MED ORDER — SODIUM CHLORIDE 0.9 % IV SOLN
Freq: Once | INTRAVENOUS | Status: AC
  Administered 2015-08-23: 15:00:00 via INTRAVENOUS

## 2015-08-23 NOTE — ED Notes (Signed)
Patient transported to MRI 

## 2015-08-23 NOTE — Discharge Instructions (Signed)
Call your pain management MD to be seen for evaluation

## 2015-08-23 NOTE — ED Notes (Signed)
PT DISCHARGED. INSTRUCTIONS GIVEN. AAOX4. PT IN NO APPARENT DISTRESS. THE OPPORTUNITY TO ASK QUESTIONS WAS PROVIDED. 

## 2015-08-23 NOTE — ED Provider Notes (Signed)
[facility]PT Provider Note   CSN: [id number] Arrival date & time: 08/23/15  1245   By signing my name below, I, [name], attest that this documentation has been prepared under the direction and in the presence of Langston Masker, [place]. Electronically signed by: [name], ED Scribe. 08/23/15. 5:24 PM.   History   Chief Complaint Chief Complaint  Patient presents with  . Back Pain    LEFT LOWER     The history is provided by the patient. No language interpreter was used.   HPI Comments:  Providencia Fayrene Fearing is a 45 y.o. female with a history of chronic sciatica and osteoarthritis who presents to the Emergency Department complaining of gradually worsening, severe left hip, buttock and lower back pain, who presents to the Emergency Department complaining of hip pain status post fall, which occurred 2 days ago. Pt reports that she was walking down wooden stairs, fell due to sudden weakness in LLE, rolled her left ankle and landed on left knee. She was seen in the ED after initial onset and was advised to follow up with PCP for evaluation and treatment of symptoms. Associated symptoms includes loss of bowel control and difficulty ambulating. Pt has tried taking pain medication at home, but has not found relief. Denies loss of bladder control. Pt has a history of hip replacement and two lumbar laminectomies.     Past Medical History:  Diagnosis Date  . Anxiety   . Arthritis   . Bursitis   . Chronic lower back pain   . Depression   . Osteoarthritis   . Pneumonia 12/16  . Sciatica     Patient Active Problem List   Diagnosis Date Noted  . Primary osteoarthritis of left hip 03/08/2015  . CAP (community acquired pneumonia) 12/24/2014  . Nausea & vomiting 12/24/2014  . Postoperative abdominal pain 05/27/2014  . HNP (herniated nucleus pulposus), lumbar 08/02/2011    Past Surgical History:  Procedure Laterality Date  . APPENDECTOMY  2011  . BACK SURGERY    . HERNIA REPAIR  4/16     umbilical  . JOINT REPLACEMENT    . LUMBAR LAMINECTOMY/DECOMPRESSION MICRODISCECTOMY  01/10/2011   Procedure: LUMBAR LAMINECTOMY/DECOMPRESSION MICRODISCECTOMY;  Surgeon: Javier Docker;  Location: [facility];  Service: Orthopedics;  Laterality: N/A;  Decompression L4 - L5  (X-Ray)  . LUMBAR LAMINECTOMY/DECOMPRESSION MICRODISCECTOMY  08/02/2011   Procedure: LUMBAR LAMINECTOMY/DECOMPRESSION MICRODISCECTOMY;  Surgeon: Javier Docker, MD;  Location: [facility];  Service: Orthopedics;  Laterality: N/A;  Re-do Decompression L4-L5  . TOTAL HIP ARTHROPLASTY Left 03/08/2015   ANTERIOR APPROACH  . TOTAL HIP ARTHROPLASTY Left 03/08/2015   Procedure: TOTAL HIP ARTHROPLASTY ANTERIOR APPROACH;  Surgeon: Marcene Corning, MD;  Location: [facility];  Service: Orthopedics;  Laterality: Left;    OB History    No data available       Home Medications    Prior to Admission medications   Medication Sig Start Date End Date Taking? Authorizing Provider  aspirin EC 325 MG EC tablet Take 1 tablet (325 mg total) by mouth 2 (two) times daily after a meal. Patient not taking: Reported on 05/20/2015 03/10/15   [name], PA-C  cephALEXin (KEFLEX) 500 MG capsule Take 1 capsule (500 mg total) by mouth 4 (four) times daily. Patient not taking: Reported on 05/28/2015 05/20/15   [name], MD  clonazePAM (KLONOPIN) 1 MG tablet Take 1 mg by mouth 2 (two) times daily.    Historical Provider, MD  gabapentin (NEURONTIN) 300 MG  capsule Take 600 mg by mouth at bedtime. Take two (2) 300 mg tablets to = 600 mg qHS    Historical Provider, MD  Chilton Si Tea POWD Take 8 oz by mouth daily with breakfast.    Historical Provider, MD  HYDROmorphone (DILAUDID) 4 MG tablet Take 1 tablet (4 mg total) by mouth every 6 (six) hours as needed for severe pain. 05/20/15   [name], MD  ibuprofen (ADVIL,MOTRIN) 200 MG tablet Take 800 mg by mouth every 6 (six) hours as needed for fever, headache, mild pain, moderate pain or cramping.    Historical Provider,  MD  LYSINE PO Take 3 tablets by mouth daily with breakfast.    Historical Provider, MD  meclizine (ANTIVERT) 25 MG tablet Take 1 tablet (25 mg total) by mouth 3 (three) times daily as needed for dizziness. Patient not taking: Reported on 08/20/2015 08/04/15   [name], PA-C  meloxicam (MOBIC) 15 MG tablet Take 1 tablet (15 mg total) by mouth daily. 08/04/15   [name], PA-C  methocarbamol (ROBAXIN) 500 MG tablet Take 1 tablet (500 mg total) by mouth every 6 (six) hours as needed for muscle spasms. Patient not taking: Reported on 08/06/2015 03/10/15   [name], PA-C  methocarbamol (ROBAXIN) 750 MG tablet Take 750 mg by mouth 2 (two) times daily as needed for muscle spasms.     Historical Provider, MD  ondansetron (ZOFRAN ODT) 4 MG disintegrating tablet 4mg  ODT q4 hours prn nausea/vomit Patient taking differently: Take 4 mg by mouth every 6 (six) hours as needed for nausea or vomiting.  05/20/15   [name], MD  ondansetron (ZOFRAN) 4 MG tablet Take 1 tablet (4 mg total) by mouth every 6 (six) hours. Patient not taking: Reported on 08/20/2015 08/04/15   [name], PA-C  predniSONE (STERAPRED UNI-PAK 21 TAB) 10 MG (21) TBPK tablet Take 1 tablet (10 mg total) by mouth daily. Take 6 tabs by mouth daily  for 2 days, then 5 tabs for 2 days, then 4 tabs for 2 days, then 3 tabs for 2 days, 2 tabs for 2 days, then 1 tab by mouth daily for 2 days Patient not taking: Reported on 07/29/2015 07/08/15   Lavera Guise, MD  traZODone (DESYREL) 150 MG tablet Take 225 mg by mouth at bedtime.  03/04/15   Historical Provider, MD  venlafaxine XR (EFFEXOR-XR) 150 MG 24 hr capsule Take 150 mg by mouth daily with breakfast.  09/21/14   Historical Provider, MD    Family History Family History  Problem Relation Age of Onset  . Heart failure Mother     Social History Social History  Substance Use Topics  . Smoking status: Never Smoker  . Smokeless tobacco: Never Used     Comment: tried smoking  3 days 2012  . Alcohol use No     Allergies   Acetaminophen; Other; Tramadol; and Hydrocodone   Review of Systems Review of Systems  Gastrointestinal:       Loss of bowel control.  Genitourinary: Negative for difficulty urinating and dysuria.  Musculoskeletal: Positive for arthralgias and back pain.  All other systems reviewed and are negative.    Physical Exam Updated Vital Signs LMP 01/15/2008 Comment: pt signed pregnancy waiver   Physical Exam  Constitutional: She appears well-developed and well-nourished. She appears distressed.  HENT:  Head: Normocephalic and atraumatic.  Mouth/Throat: Oropharynx is clear and moist. No oropharyngeal exudate.  Eyes: Conjunctivae and EOM are normal. Pupils are equal, round, and reactive  to light. Right eye exhibits no discharge. Left eye exhibits no discharge. No scleral icterus.  Neck: Normal range of motion. Neck supple. No JVD present. No thyromegaly present.  Cardiovascular: Normal rate, regular rhythm, normal heart sounds and intact distal pulses.  Exam reveals no gallop and no friction rub.   No murmur heard. Pulmonary/Chest: Effort normal and breath sounds normal. No respiratory distress. She has no wheezes. She has no rales.  Abdominal: Soft. Bowel sounds are normal. She exhibits no distension and no mass. There is no tenderness.  Rectal exam showed good tone, stool in vault.   Musculoskeletal: Normal range of motion. She exhibits tenderness. She exhibits no edema.  Diffusely tender in lower lumbar spine. Pain with ROM of left leg.  Lymphadenopathy:    She has no cervical adenopathy.  Neurological: She is alert. Coordination normal.  Skin: Skin is warm and dry. No rash noted. No erythema.  Psychiatric: She has a normal mood and affect. Her behavior is normal.  Nursing note and vitals reviewed.    ED Treatments / Results  DIAGNOSTIC STUDIES:  Oxygen Saturation is 99% on room air, normal by my interpretation.     COORDINATION OF CARE:  1:58 PM Discussed treatment plan with pt at bedside, which includes MRI and IV pain medication, Ibuprofen and Robaxin, and pt agreed to plan.   Labs (all labs ordered are listed, but only abnormal results are displayed) Labs Reviewed - No data to display  EKG  EKG Interpretation None       Radiology Mr Lumbar Spine Wo Contrast  Result Date: 08/23/2015 CLINICAL DATA:  Left low back pain radiating to left buttock and thigh starting on Sunday. Fall down stairs. EXAM: MRI LUMBAR SPINE WITHOUT CONTRAST TECHNIQUE: Multiplanar, multisequence MR imaging of the lumbar spine was performed. No intravenous contrast was administered. COMPARISON:  Radiographs dated 08/04/2015 and prior lumbar MRI from 09/20/2013 FINDINGS: Segmentation: The lowest lumbar type non-rib-bearing vertebra is labeled as L5. Alignment:  No vertebral subluxation is observed. Vertebrae: No significant vertebral marrow edema is identified. Disc desiccation at L4-5 and L5-S1 with mild loss of disc height at L4-5. Minimal degenerative endplate findings at T11-12. Conus medullaris: Extends to the L1 level and appears normal. Paraspinal and other soft tissues: Unremarkable Disc levels: T12-L1: Unremarkable. L1- 2: Unremarkable. L2-3:  Unremarkable. L3-4:  Unremarkable. L4-5: No impingement. Mild bilateral facet arthropathy and minimal disc bulge. Postoperative findings on the left with laminectomy and partial facetectomy. L5-S1: No impingement. Mild bilateral facet arthropathy and mild disc bulge. IMPRESSION: 1. No impingement is identified to explain the patient's left-sided symptoms. 2. Postoperative findings at the left at L4-5 without recurrent impingement. Electronically Signed   By: Gaylyn RongWalter  Liebkemann M.D.   On: 08/23/2015 16:59    Procedures Procedures (including critical care time)  Medications Ordered in ED Medications - No data to display   Initial Impression / Assessment and Plan / ED Course  I  have reviewed the triage vital signs and the nursing notes.  Pertinent labs & imaging results that were available during my care of the patient were reviewed by me and considered in my medical decision making (see chart for details).  Clinical Course   Pt given Iv pain medication with intermittent relief.  Pt has normal tone on rectal exam.  I don't think recent incontinence is related to back given normal MRI and good tone. MDM Number of Diagnoses or Management Options Back pain:  Incontinence of feces:   Due to loss of  control of bowels, MRI is ordered. MRI shows no evidence of new herniation or new nerve impingement.  Final Clinical Impressions(s) / ED Diagnoses   Final diagnoses:  Back pain  Incontinence of feces  Bilateral low back pain without sciatica    New Prescriptions  Pt advised to add ibuprofen to her treatment.  Pt advised to call her MD for recheck and to discuss pain managemnt An After Visit Summary was printed and given to the patient.  I personally performed the services in this documentation, which was scribed in my presence.  The recorded information has been reviewed and considered.   Barnet PallKaren SofiaPAC.   Lonia SkinnerLeslie K Broadview HeightsSofia, PA-C 08/23/15 2041    Benjiman CoreNathan Pickering, MD 08/24/15 2115

## 2015-08-23 NOTE — ED Triage Notes (Addendum)
PT /CO LEFT LOWER BACK PAIN RADIATING DOWN THE LEFT BUTTOCK AND THIGH WITH NUMBNESS AND TINGLING SINCE Sunday. PT STATES SHE FELL DOWN 4 FLIGHTS OF WOODEN STAIRS. PT WAS SEEN ON Sunday FOR SAME, BUT STATES THE PAIN IS GETTING WORSE. DENIES RE INJURY. PT STATES SHE HAS BEEN HAVING TROUBLE CONTROLLING HER URINATION AND BOWEL , MOVEMENTS X1 WEEK.

## 2015-08-25 ENCOUNTER — Encounter (HOSPITAL_COMMUNITY): Payer: Self-pay | Admitting: Emergency Medicine

## 2015-08-25 ENCOUNTER — Emergency Department (HOSPITAL_COMMUNITY)
Admission: EM | Admit: 2015-08-25 | Discharge: 2015-08-25 | Disposition: A | Discharge status: Home / Self Care | Payer: Medicare HMO | Attending: Emergency Medicine | Admitting: Emergency Medicine

## 2015-08-25 DIAGNOSIS — M549 Dorsalgia, unspecified: Secondary | ICD-10-CM | POA: Insufficient documentation

## 2015-08-25 DIAGNOSIS — Z79899 Other long term (current) drug therapy: Secondary | ICD-10-CM | POA: Diagnosis not present

## 2015-08-25 DIAGNOSIS — G8929 Other chronic pain: Secondary | ICD-10-CM | POA: Insufficient documentation

## 2015-08-25 DIAGNOSIS — R296 Repeated falls: Secondary | ICD-10-CM | POA: Insufficient documentation

## 2015-08-25 DIAGNOSIS — Z791 Long term (current) use of non-steroidal anti-inflammatories (NSAID): Secondary | ICD-10-CM | POA: Insufficient documentation

## 2015-08-25 LAB — URINALYSIS, ROUTINE W REFLEX MICROSCOPIC
Bilirubin Urine: NEGATIVE
Glucose, UA: NEGATIVE mg/dL
Hgb urine dipstick: NEGATIVE
Ketones, ur: NEGATIVE mg/dL
Leukocytes, UA: NEGATIVE
Nitrite: NEGATIVE
Protein, ur: NEGATIVE mg/dL
Specific Gravity, Urine: 1.023 (ref 1.005–1.030)
pH: 6 (ref 5.0–8.0)

## 2015-08-25 LAB — CBG MONITORING, ED: Glucose-Capillary: 173 mg/dL — ABNORMAL HIGH (ref 65–99)

## 2015-08-25 MED ORDER — LORAZEPAM 0.5 MG PO TABS
0.5000 mg | ORAL_TABLET | Freq: Once | ORAL | Status: AC
  Administered 2015-08-25: 0.5 mg via ORAL
  Filled 2015-08-25: qty 1

## 2015-08-25 MED ORDER — KETOROLAC TROMETHAMINE 30 MG/ML IJ SOLN
30.0000 mg | Freq: Once | INTRAMUSCULAR | Status: AC
  Administered 2015-08-25: 30 mg via INTRAMUSCULAR
  Filled 2015-08-25: qty 1

## 2015-08-25 MED ORDER — HYDROMORPHONE HCL 2 MG/ML IJ SOLN
2.0000 mg | Freq: Once | INTRAMUSCULAR | Status: AC
  Administered 2015-08-25: 2 mg via INTRAMUSCULAR
  Filled 2015-08-25: qty 1

## 2015-08-25 MED ORDER — HYDROMORPHONE HCL 2 MG/ML IJ SOLN
1.5000 mg | Freq: Once | INTRAMUSCULAR | Status: AC
  Administered 2015-08-25: 1.5 mg via INTRAMUSCULAR
  Filled 2015-08-25: qty 1

## 2015-08-25 MED ORDER — HALOPERIDOL LACTATE 5 MG/ML IJ SOLN
2.0000 mg | Freq: Once | INTRAMUSCULAR | Status: AC
  Administered 2015-08-25: 2 mg via INTRAMUSCULAR
  Filled 2015-08-25: qty 1

## 2015-08-25 NOTE — Discharge Instructions (Signed)
You need to follow-up with your pain management provider. Chronic pain is not ideally managed from the Emergency Department.

## 2015-08-25 NOTE — ED Notes (Addendum)
Upon leaving room, pt got up from bed and retrieved her purse from the chairs at bedside and returned to the bed. Both bedrails were up when RN Casimiro NeedleMichael left the room and were still in place when [employer] Taylor Spilde returned 5 minutes later.

## 2015-08-25 NOTE — ED Notes (Signed)
MD at bedside. 

## 2015-08-25 NOTE — ED Notes (Signed)
Kohut MD at bedside. 

## 2015-08-25 NOTE — ED Provider Notes (Signed)
[facility]-EMERGENCY DEPT Provider Note   CSN: [id number] Arrival date & time: 08/25/15  1157  By signing my name below, I, Placido Sou, attest that this documentation has been prepared under the direction and in the presence of Raeford Razor, MD. Electronically Signed: Placido Sou, ED Scribe. 08/25/15. 12:42 PM.   History   Chief Complaint Chief Complaint  Patient presents with  . Fall  . Dizziness  . Back Pain    HPI HPI Comments: Dynastie Fayrene Fearing is a 45 y.o. female with a PMHx of chronic sciatica and frequent falls as well as a SHx including lumbar laminectomy and total left hip arthroplasty who presents to the Emergency Department complaining of worsening, moderate, left lower back pain x 5 days. Pt was evaluated on 08/23/2015 s/p a fall 2 days earlier and received an MRI showing no new herniation or new nerve impingement. She reports decreased food/liquid intake for 7 days due to her difficulty ambulating, difficulty sleeping due to pain and beginning last night she began feeling lightheaded. Upon arrival to the ED today pt states that she was feeling lightheaded while ambulating and fell to the floor. She states her pain radiates though her left hip and down her LLE. Pt states she has been sleeping an average of 3 hours per night due to pain. Pt ambulates with a cane at baseline. Pt regularly takes meloxicam, 4 mg dilaudid and gabapentin for pain management noting she has been out of her pain medications for 1 week due to her physician being out of town for an unspecified amount of time. Today she has taken venlafaxine for anxiety, klonopin for panic attacks and a [employer]. She denies she is currently taking steroid medications. Pt states that she drinks 1 teaspoon of vinegar 2x daily.     Pt states that she "feels as if her mind is racing" but notes this is normal for her. She notes she is a "worry wart" and has a young child. She states she sleeps on the second floor of her home and  has difficulty using the stairs causing her to stay in her room most of the day. Pt denies she has any other people in her life that can provide aid for herself or her son. She denies hallucinations or other associated symptoms at this time.   Psychiatrist: Dr. Dolores Frame   The history is provided by the patient and medical records. No language interpreter was used.    Past Medical History:  Diagnosis Date  . Anxiety   . Arthritis   . Bursitis   . Chronic lower back pain   . Depression   . Osteoarthritis   . Pneumonia 12/16  . Sciatica     Patient Active Problem List   Diagnosis Date Noted  . Primary osteoarthritis of left hip 03/08/2015  . CAP (community acquired pneumonia) 12/24/2014  . Nausea & vomiting 12/24/2014  . Postoperative abdominal pain 05/27/2014  . HNP (herniated nucleus pulposus), lumbar 08/02/2011    Past Surgical History:  Procedure Laterality Date  . APPENDECTOMY  2011  . BACK SURGERY    . HERNIA REPAIR  4/16   umbilical  . JOINT REPLACEMENT    . LUMBAR LAMINECTOMY/DECOMPRESSION MICRODISCECTOMY  01/10/2011   Procedure: LUMBAR LAMINECTOMY/DECOMPRESSION MICRODISCECTOMY;  Surgeon: Javier Docker;  Location: [facility];  Service: Orthopedics;  Laterality: N/A;  Decompression L4 - L5  (X-Ray)  . LUMBAR LAMINECTOMY/DECOMPRESSION MICRODISCECTOMY  08/02/2011   Procedure: LUMBAR LAMINECTOMY/DECOMPRESSION MICRODISCECTOMY;  Surgeon: Javier Docker, MD;  Location: [facility];  Service: Orthopedics;  Laterality: N/A;  Re-do Decompression L4-L5  . TOTAL HIP ARTHROPLASTY Left 03/08/2015   ANTERIOR APPROACH  . TOTAL HIP ARTHROPLASTY Left 03/08/2015   Procedure: TOTAL HIP ARTHROPLASTY ANTERIOR APPROACH;  Surgeon: [name], MD;  Location: [facility];  Service: Orthopedics;  Laterality: Left;    OB History    No data available       Home Medications    Prior to Admission medications   Medication Sig Start Date End Date Taking? Authorizing Provider  aspirin EC 325 MG EC  tablet Take 1 tablet (325 mg total) by mouth 2 (two) times daily after a meal. Patient not taking: Reported on 05/20/2015 03/10/15   Elodia FlorenceAndrew Nida, PA-C  cephALEXin (KEFLEX) 500 MG capsule Take 1 capsule (500 mg total) by mouth 4 (four) times daily. Patient not taking: Reported on 05/28/2015 05/20/15   Bethann BerkshireJoseph Zammit, MD  clonazePAM (KLONOPIN) 1 MG tablet Take 1 mg by mouth 2 (two) times daily.    Historical Provider, MD  gabapentin (NEURONTIN) 300 MG capsule Take 600 mg by mouth at bedtime. Take two (2) 300 mg tablets to = 600 mg qHS    Historical Provider, MD  Chilton SiGreen Tea POWD Take 8 oz by mouth daily with breakfast.    Historical Provider, MD  HYDROmorphone (DILAUDID) 4 MG tablet Take 1 tablet (4 mg total) by mouth every 6 (six) hours as needed for severe pain. 05/20/15   Bethann BerkshireJoseph Zammit, MD  ibuprofen (ADVIL,MOTRIN) 200 MG tablet Take 800 mg by mouth every 6 (six) hours as needed for fever, headache, mild pain, moderate pain or cramping.    Historical Provider, MD  LYSINE PO Take 3 tablets by mouth daily with breakfast.    Historical Provider, MD  meclizine (ANTIVERT) 25 MG tablet Take 1 tablet (25 mg total) by mouth 3 (three) times daily as needed for dizziness. Patient not taking: Reported on 08/20/2015 08/04/15   Jaynie Crumbleatyana Kirichenko, PA-C  meloxicam (MOBIC) 15 MG tablet Take 1 tablet (15 mg total) by mouth daily. 08/04/15   [name], PA-C  methocarbamol (ROBAXIN) 500 MG tablet Take 1 tablet (500 mg total) by mouth every 6 (six) hours as needed for muscle spasms. Patient not taking: Reported on 08/06/2015 03/10/15   Elodia FlorenceAndrew Nida, PA-C  methocarbamol (ROBAXIN) 750 MG tablet Take 750 mg by mouth 2 (two) times daily as needed for muscle spasms.     Historical Provider, MD  ondansetron (ZOFRAN ODT) 4 MG disintegrating tablet 4mg  ODT q4 hours prn nausea/vomit Patient taking differently: Take 4 mg by mouth every 6 (six) hours as needed for nausea or vomiting.  05/20/15   Bethann BerkshireJoseph Zammit, MD  ondansetron (ZOFRAN)  4 MG tablet Take 1 tablet (4 mg total) by mouth every 6 (six) hours. Patient not taking: Reported on 08/20/2015 08/04/15   Jaynie Crumbleatyana Kirichenko, PA-C  predniSONE (STERAPRED UNI-PAK 21 TAB) 10 MG (21) TBPK tablet Take 1 tablet (10 mg total) by mouth daily. Take 6 tabs by mouth daily  for 2 days, then 5 tabs for 2 days, then 4 tabs for 2 days, then 3 tabs for 2 days, 2 tabs for 2 days, then 1 tab by mouth daily for 2 days Patient not taking: Reported on 07/29/2015 07/08/15   Lavera Guiseana Duo Liu, MD  traZODone (DESYREL) 150 MG tablet Take 225 mg by mouth at bedtime.  03/04/15   Historical Provider, MD  venlafaxine XR (EFFEXOR-XR) 150 MG 24 hr capsule Take 150 mg by mouth daily  with breakfast.  09/21/14   Historical Provider, MD    Family History Family History  Problem Relation Age of Onset  . Heart failure Mother     Social History Social History  Substance Use Topics  . Smoking status: Never Smoker  . Smokeless tobacco: Never Used     Comment: tried smoking 3 days 2012  . Alcohol use No     Allergies   Acetaminophen; Other; Tramadol; and Hydrocodone   Review of Systems Review of Systems  Constitutional: Positive for fatigue.  Musculoskeletal: Positive for arthralgias, back pain and myalgias.  Skin: Negative for color change and wound.  Neurological: Positive for light-headedness.  Psychiatric/Behavioral: Positive for sleep disturbance. Negative for hallucinations.  All other systems reviewed and are negative.  Physical Exam Updated Vital Signs LMP 01/15/2008 Comment: pt signed pregnancy waiver  Physical Exam  Constitutional: She is oriented to person, place, and time. She appears well-developed and well-nourished.  HENT:  Head: Normocephalic.  Eyes: EOM are normal.  Neck: Normal range of motion.  Pulmonary/Chest: Effort normal.  Abdominal: She exhibits no distension.  Musculoskeletal: Normal range of motion. She exhibits tenderness.  TTP to left greater trochanter. Good strength in  the LLE but severe pain with ROM. Well healed midline lumbar cervical incision.   Neurological: She is alert and oriented to person, place, and time. She has normal strength.  Psychiatric:  Pt seems manic. Startles easily. Rambling speech. Frequently loses train of thought.   Nursing note and vitals reviewed.  ED Treatments / Results  Labs (all labs ordered are listed, but only abnormal results are displayed) Labs Reviewed - No data to display  EKG  EKG Interpretation None       Radiology Mr Lumbar Spine Wo Contrast  Result Date: 08/23/2015 CLINICAL DATA:  Left low back pain radiating to left buttock and thigh starting on Sunday. Fall down stairs. EXAM: MRI LUMBAR SPINE WITHOUT CONTRAST TECHNIQUE: Multiplanar, multisequence MR imaging of the lumbar spine was performed. No intravenous contrast was administered. COMPARISON:  Radiographs dated 08/04/2015 and prior lumbar MRI from 09/20/2013 FINDINGS: Segmentation: The lowest lumbar type non-rib-bearing vertebra is labeled as L5. Alignment:  No vertebral subluxation is observed. Vertebrae: No significant vertebral marrow edema is identified. Disc desiccation at L4-5 and L5-S1 with mild loss of disc height at L4-5. Minimal degenerative endplate findings at T11-12. Conus medullaris: Extends to the L1 level and appears normal. Paraspinal and other soft tissues: Unremarkable Disc levels: T12-L1: Unremarkable. L1- 2: Unremarkable. L2-3:  Unremarkable. L3-4:  Unremarkable. L4-5: No impingement. Mild bilateral facet arthropathy and minimal disc bulge. Postoperative findings on the left with laminectomy and partial facetectomy. L5-S1: No impingement. Mild bilateral facet arthropathy and mild disc bulge. IMPRESSION: 1. No impingement is identified to explain the patient's left-sided symptoms. 2. Postoperative findings at the left at L4-5 without recurrent impingement. Electronically Signed   By: Gaylyn RongWalter  Liebkemann M.D.   On: 08/23/2015 16:59     Procedures Procedures  DIAGNOSTIC STUDIES: Oxygen Saturation is 100% on RA, normal by my interpretation.    COORDINATION OF CARE: 12:39 PM Discussed next steps with pt. Pt verbalized understanding and is agreeable with the plan.    Medications Ordered in ED Medications - No data to display   Initial Impression / Assessment and Plan / ED Course  I have reviewed the triage vital signs and the nursing notes.  Pertinent labs & imaging results that were available during my care of the patient were reviewed by me and considered  in my medical decision making (see chart for details).  Clinical Course    44yF with back pain. Chronic and in pain management. Several recent ED visits for the same. Pain management provider is currenty out of town and she normally gets refills in the middle of the month. She is coming to the ED because she is out of her meds. Medicated in the ED with some improvement. Advised that chronic pain management isn't done through ED. It has been determined that no acute conditions requiring further emergency intervention are present at this time. The patient has been advised of the diagnosis and plan. I reviewed any labs and imaging including any potential incidental findings. We have discussed signs and symptoms that warrant return to the ED and they are listed in the discharge instructions.    Final Clinical Impressions(s) / ED Diagnoses   Final diagnoses:  Chronic pain  Chronic back pain    New Prescriptions New Prescriptions   No medications on file     Raeford Razor, MD 08/28/15 1325

## 2015-08-25 NOTE — ED Triage Notes (Signed)
Patient came in for back pain and left side pain that hasnt gotten any better over the past 5 days.  Patient states that she fell down her garage steps on Saturday. Patient was telling the front desk that she was dizzy.  When Dorene GrebeNatalie RN called patient from lobby to take to triage room patient had fall to the ground.  Per Dorene GrebeNatalie RN patient was a little confused after fall.  Patient states

## 2015-08-26 ENCOUNTER — Emergency Department (HOSPITAL_COMMUNITY)
Admission: EM | Admit: 2015-08-26 | Discharge: 2015-08-27 | Disposition: A | Discharge status: Home / Self Care | Payer: Medicare HMO | Attending: Emergency Medicine | Admitting: Emergency Medicine

## 2015-08-26 ENCOUNTER — Encounter (HOSPITAL_COMMUNITY): Payer: Self-pay

## 2015-08-26 DIAGNOSIS — Z791 Long term (current) use of non-steroidal anti-inflammatories (NSAID): Secondary | ICD-10-CM | POA: Insufficient documentation

## 2015-08-26 DIAGNOSIS — M545 Low back pain, unspecified: Secondary | ICD-10-CM

## 2015-08-26 DIAGNOSIS — Z79899 Other long term (current) drug therapy: Secondary | ICD-10-CM | POA: Insufficient documentation

## 2015-08-26 DIAGNOSIS — G8929 Other chronic pain: Secondary | ICD-10-CM | POA: Diagnosis not present

## 2015-08-26 MED ORDER — KETOROLAC TROMETHAMINE 30 MG/ML IJ SOLN
30.0000 mg | Freq: Once | INTRAMUSCULAR | Status: AC
  Administered 2015-08-26: 30 mg via INTRAVENOUS
  Filled 2015-08-26: qty 1

## 2015-08-26 MED ORDER — HYDROMORPHONE HCL 2 MG/ML IJ SOLN
2.0000 mg | Freq: Once | INTRAMUSCULAR | Status: AC
  Administered 2015-08-26: 2 mg via INTRAVENOUS
  Filled 2015-08-26: qty 1

## 2015-08-26 MED ORDER — DIAZEPAM 5 MG/ML IJ SOLN
2.5000 mg | Freq: Once | INTRAMUSCULAR | Status: AC
  Administered 2015-08-26: 2.5 mg via INTRAVENOUS
  Filled 2015-08-26: qty 2

## 2015-08-26 NOTE — Progress Notes (Addendum)
Patient noted to have been seen in the ED 13 times in the last six months. Patient listed as having [employer] insurance.  Pcp listed as Dr. Virl Sonammy Boyd. Patient without ED care plan.  Email sent to Dr. Bebe ShaggyWickline. EDCM spoke to patient at bedside.  She confirms her pcp is Dr. Virl Sonammy Boyd.  She reports she has spoken with them today.  She reports she will be speaking with her pcp office on Monday as well. Patient reports [facility].  She reports her doctor at the pain management clinic is out of town and has been followed by a PA, "who can only do so much."  She reports she has obtained an appointment to see her pain management doctor on Friday August 25.   Patient ambulates with a cane. Patient reports the only medication she doesn't have at home is her dilaudid.  Discussed patient with EDPA.

## 2015-08-26 NOTE — ED Triage Notes (Signed)
PT C/O INCREASING LOWER BACK PAIN RADIATING DOWN BOTH LEGS WITH NUMBNESS SINCE THIS MORNING. PT STS SHE HAD A SYNCOPAL EPISODE YESTERDAY WHILE HERE, AND FELL ONTO HER BUTTOCKS. PT STS, "I THINK THIS PAIN IS FROM ME FALLING YESTERDAY." DENIES INJURY TODAY.

## 2015-08-26 NOTE — ED Provider Notes (Signed)
[facility]-EMERGENCY DEPT Provider Note   CSN: [id number] Arrival date & time: 08/26/15  1909  By signing my name below, I, Judith Hardy, attest that this documentation has been prepared under the direction and in the presence of TRW AutomotiveKelly Jermichael Belmares, PA-C.  Electronically Signed: Rosario AdieWilliam Andrew Hardy, ED Scribe. 08/26/15. 9:00 PM.  History   Chief Complaint Chief Complaint  Patient presents with  . Back Pain   The history is provided by the patient and medical records. No language interpreter was used.   HPI Comments: Judith Hardy is a 45 y.o. female with a PMHx significant of sciatica, frequent falls, and chronic lower back pain, who presents to the Emergency Department complaining of gradually worsening, constant, acute on chronic, 10/10 lower back pain worsening this AM PTA. She described her pain as aching and throbbing. She notes that she woke up with her pain this AM. Pt was seen in the ED for same yesterday, and states that while she was in the ED she sustained a near-syncopal episode where she fell backwards into a sitting position. She believes that her new acute pain may have been sustained from the fall. Pt reports that her hx of chronic pain is typically left sided and only radiates down her leg, but is now across her lower back and radiates down both of her legs. She now reports experiencing new intermittent episodes of numbness to her thighs. Pt notes that she has an appointment with her pain management doctor in 1 week to refill her Dilaudid prescription. She has taken her prescribed Robaxin, Klonopin, Venlafaxine, and Gabapentin with minimal relief of her pain. She has been ambulatory with mild difficulty, secondary to her pain since the fall. Denies bowel/bladder incontinence, or any other associated symptoms.   Past Medical History:  Diagnosis Date  . Anxiety   . Arthritis   . Bursitis   . Chronic lower back pain   . Depression   . Osteoarthritis   . Pneumonia 12/16  .  Sciatica    Patient Active Problem List   Diagnosis Date Noted  . Primary osteoarthritis of left hip 03/08/2015  . CAP (community acquired pneumonia) 12/24/2014  . Nausea & vomiting 12/24/2014  . Postoperative abdominal pain 05/27/2014  . HNP (herniated nucleus pulposus), lumbar 08/02/2011   Past Surgical History:  Procedure Laterality Date  . APPENDECTOMY  2011  . BACK SURGERY    . HERNIA REPAIR  4/16   umbilical  . JOINT REPLACEMENT    . LUMBAR LAMINECTOMY/DECOMPRESSION MICRODISCECTOMY  01/10/2011   Procedure: LUMBAR LAMINECTOMY/DECOMPRESSION MICRODISCECTOMY;  Surgeon: [name];  Location: [facility];  Service: Orthopedics;  Laterality: N/A;  Decompression L4 - L5  (X-Ray)  . LUMBAR LAMINECTOMY/DECOMPRESSION MICRODISCECTOMY  08/02/2011   Procedure: LUMBAR LAMINECTOMY/DECOMPRESSION MICRODISCECTOMY;  Surgeon: [name], MD;  Location: [facility];  Service: Orthopedics;  Laterality: N/A;  Re-do Decompression L4-L5  . TOTAL HIP ARTHROPLASTY Left 03/08/2015   ANTERIOR APPROACH  . TOTAL HIP ARTHROPLASTY Left 03/08/2015   Procedure: TOTAL HIP ARTHROPLASTY ANTERIOR APPROACH;  Surgeon: [name], MD;  Location: [facility];  Service: Orthopedics;  Laterality: Left;   OB History    No data available     Home Medications    Prior to Admission medications   Medication Sig Start Date End Date Taking? Authorizing Provider  clonazePAM (KLONOPIN) 1 MG tablet Take 1 mg by mouth 2 (two) times daily.    Historical Provider, MD  gabapentin (NEURONTIN) 300 MG capsule Take 600 mg by mouth at  bedtime. Take two (2) 300 mg tablets to = 600 mg qHS    Historical Provider, MD  Chilton Si Tea POWD Take 8 oz by mouth daily with breakfast.    Historical Provider, MD  HYDROmorphone (DILAUDID) 4 MG tablet Take 1 tablet (4 mg total) by mouth every 6 (six) hours as needed for severe pain. 05/20/15   [name], MD  ibuprofen (ADVIL,MOTRIN) 200 MG tablet Take 800 mg by mouth every 6 (six) hours as needed for fever,  headache, mild pain, moderate pain or cramping.    Historical Provider, MD  LYSINE PO Take 3 tablets by mouth daily with breakfast.    Historical Provider, MD  meclizine (ANTIVERT) 25 MG tablet Take 1 tablet (25 mg total) by mouth 3 (three) times daily as needed for dizziness. Patient not taking: Reported on 08/20/2015 08/04/15   [name], PA-C  meloxicam (MOBIC) 15 MG tablet Take 1 tablet (15 mg total) by mouth daily. 08/04/15   [name], PA-C  methocarbamol (ROBAXIN) 500 MG tablet Take 1 tablet (500 mg total) by mouth every 6 (six) hours as needed for muscle spasms. Patient not taking: Reported on 08/06/2015 03/10/15   [name], PA-C  methocarbamol (ROBAXIN) 750 MG tablet Take 750 mg by mouth 2 (two) times daily as needed for muscle spasms.     Historical Provider, MD  ondansetron (ZOFRAN ODT) 4 MG disintegrating tablet 4mg  ODT q4 hours prn nausea/vomit Patient taking differently: Take 4 mg by mouth every 6 (six) hours as needed for nausea or vomiting.  05/20/15   [name], MD  ondansetron (ZOFRAN) 4 MG tablet Take 1 tablet (4 mg total) by mouth every 6 (six) hours. Patient not taking: Reported on 08/20/2015 08/04/15   [name], PA-C  traZODone (DESYREL) 150 MG tablet Take 225 mg by mouth at bedtime.  03/04/15   Historical Provider, MD  venlafaxine XR (EFFEXOR-XR) 150 MG 24 hr capsule Take 150 mg by mouth daily with breakfast.  09/21/14   Historical Provider, MD   Family History Family History  Problem Relation Age of Onset  . Heart failure Mother    Social History Social History  Substance Use Topics  . Smoking status: Never Smoker  . Smokeless tobacco: Never Used     Comment: tried smoking 3 days 2012  . Alcohol use No   Allergies   Acetaminophen; Other; Tramadol; and Hydrocodone  Review of Systems Review of Systems  Musculoskeletal: Positive for back pain.  A complete 10 system review of systems was obtained and all systems are negative except as  noted in the HPI and PMH.    Physical Exam Updated Vital Signs BP (!) 153/108 (BP Location: Left Arm)   Pulse 89   Temp 98.1 F (36.7 C) (Oral)   Resp 20   Ht 5\' 7"  (1.702 m)   Wt 205 lb (93 kg)   LMP 01/15/2008 Comment: pt signed pregnancy waiver  SpO2 100%   BMI 32.11 kg/m   Physical Exam  Constitutional: She is oriented to person, place, and time. She appears well-developed and well-nourished. No distress.  Nontoxic appearing Patient does seem uncomfortable  HENT:  Head: Normocephalic and atraumatic.  Eyes: Conjunctivae and EOM are normal. No scleral icterus.  Neck: Normal range of motion.  Cardiovascular: Normal rate, regular rhythm and intact distal pulses.   DP and PT pulses 2+ b/l  Pulmonary/Chest: Effort normal. No respiratory distress.  Respirations even and unlabored  Abdominal: She exhibits no distension.  Musculoskeletal: She exhibits  tenderness.       Lumbar back: She exhibits tenderness and pain. She exhibits no deformity and no spasm.       Back:  Diffuse TTP across the low back without bony deformities, step offs, or crepitus to the lumbosacral midline. Positive straight leg raise and crossed straight leg raise. No appreciable spasm.  Neurological: She is alert and oriented to person, place, and time.  Sensation to light touch intact in BLE. Patient with intact and equal achilles and patellar reflexes b/l. Patient able to wiggle all toes.  Skin: Skin is warm and dry. No rash noted. She is not diaphoretic. No erythema. No pallor.  Psychiatric: She has a normal mood and affect. Her behavior is normal.  Nursing note and vitals reviewed.  ED Treatments / Results  DIAGNOSTIC STUDIES: Oxygen Saturation is 100% on RA, normal by my interpretation.   COORDINATION OF CARE: 8:55 PM-Discussed next steps with pt. Pt verbalized understanding and is agreeable with the plan.   Labs (all labs ordered are listed, but only abnormal results are displayed) Labs Reviewed  - No data to display  Mr Lumbar Spine Wo Contrast Result Date: 08/23/2015 CLINICAL DATA:  Left low back pain radiating to left buttock and thigh starting on Sunday. Fall down stairs. EXAM: MRI LUMBAR SPINE WITHOUT CONTRAST TECHNIQUE: Multiplanar, multisequence MR imaging of the lumbar spine was performed. No intravenous contrast was administered. COMPARISON:  Radiographs dated 08/04/2015 and prior lumbar MRI from 09/20/2013 FINDINGS: Segmentation: The lowest lumbar type non-rib-bearing vertebra is labeled as L5. Alignment:  No vertebral subluxation is observed. Vertebrae: No significant vertebral marrow edema is identified. Disc desiccation at L4-5 and L5-S1 with mild loss of disc height at L4-5. Minimal degenerative endplate findings at T11-12. Conus medullaris: Extends to the L1 level and appears normal. Paraspinal and other soft tissues: Unremarkable Disc levels: T12-L1: Unremarkable. L1- 2: Unremarkable. L2-3:  Unremarkable. L3-4:  Unremarkable. L4-5: No impingement. Mild bilateral facet arthropathy and minimal disc bulge. Postoperative findings on the left with laminectomy and partial facetectomy. L5-S1: No impingement. Mild bilateral facet arthropathy and mild disc bulge. IMPRESSION: 1. No impingement is identified to explain the patient's left-sided symptoms. 2. Postoperative findings at the left at L4-5 without recurrent impingement. Electronically Signed   By: Gaylyn Rong M.D.   On: 08/23/2015 16:59   Procedures Procedures (including critical care time)  Medications Ordered in ED Medications - No data to display  Initial Impression / Assessment and Plan / ED Course  I have reviewed the triage vital signs and the nursing notes.  Pertinent labs & imaging results that were available during my care of the patient were reviewed by me and considered in my medical decision making (see chart for details).  Clinical Course    10:57 PM Of note, PATIENT HAD 120 TABLETS OF 4MG  DILAUDID  TABLETS FILLED ON 08/08/15. She reports her appointment for follow up is in 1 week. This should mean that the patient is adequately covered with pain medication until her follow up appointment on 09/02/15.  12:10 AM I had a long discussion with the patient at bedside. It was realized that the patient was recently prescribed enough 4 mg Dilaudid tablets to get her through to the end of this month. I question the patient as to why she told the previous provider she had seen that she lost her medication. She initially eluded to falling downstairs within her home. She later reported that her fall was around the stairs into her home and  her pill box in her purse opened and the tablets were thrown in the dirt. She alleges that she tried to find these with her husband, but was unsuccessful.  I explained to the patient that I am uncomfortable with my initial plan of providing her with a fentanyl patch for pain control if she does, in fact, have Dilaudid tablets left over at home. I explained to the patient that it would be irresponsible of me to prescribe her too strong opiates simultaneously. I also explained that I have no way to confirm whether or not she did in fact lose her medication as she claims. For this reason, she has been offered 4 mg Dilaudid tablet prior to discharge.  Patient expresses frustration over her new plan. She is tearful stating, "I didn't come here to deceive you" and "I don't want to risk my reputation with this hospital". The patient has been told to continue follow up with her pain management doctor. She does state that her pain feels better than when she arrives. I believe she is stable for discharge.   Final Clinical Impressions(s) / ED Diagnoses   Final diagnoses:  Acute exacerbation of chronic low back pain    Patient with acute on chronic back pain. Seen x 5 in the past week in an ED setting. She is neurovascularly intact. Patient can walk but states is painful. No loss of  bowel or bladder control. No concern for cauda equina. Recent MRI reassuring. Patient stable for discharge with instruction to f/u with her pain management clinic; discharged in satisfactory condition.   New Prescriptions New Prescriptions   No medications on file   I personally performed the services described in this documentation, which was scribed in my presence. The recorded information has been reviewed and is accurate.       Antony MaduraKelly Saurav Crumble, PA-C 08/27/15 0245    Gwyneth SproutWhitney Plunkett, MD 08/28/15 [phone]

## 2015-08-27 MED ORDER — HYDROMORPHONE HCL 2 MG PO TABS
4.0000 mg | ORAL_TABLET | Freq: Once | ORAL | Status: AC
  Administered 2015-08-27: 4 mg via ORAL
  Filled 2015-08-27: qty 2

## 2015-08-30 ENCOUNTER — Encounter (HOSPITAL_COMMUNITY): Payer: Self-pay

## 2015-08-30 ENCOUNTER — Emergency Department (HOSPITAL_COMMUNITY)
Admission: EM | Admit: 2015-08-30 | Discharge: 2015-08-30 | Disposition: A | Discharge status: Home / Self Care | Payer: Medicare HMO | Attending: Emergency Medicine | Admitting: Emergency Medicine

## 2015-08-30 ENCOUNTER — Emergency Department (HOSPITAL_COMMUNITY): Payer: Medicare HMO

## 2015-08-30 DIAGNOSIS — M5440 Lumbago with sciatica, unspecified side: Secondary | ICD-10-CM | POA: Insufficient documentation

## 2015-08-30 DIAGNOSIS — Z96642 Presence of left artificial hip joint: Secondary | ICD-10-CM | POA: Insufficient documentation

## 2015-08-30 DIAGNOSIS — Z79899 Other long term (current) drug therapy: Secondary | ICD-10-CM | POA: Insufficient documentation

## 2015-08-30 DIAGNOSIS — Z791 Long term (current) use of non-steroidal anti-inflammatories (NSAID): Secondary | ICD-10-CM | POA: Insufficient documentation

## 2015-08-30 DIAGNOSIS — M545 Low back pain: Secondary | ICD-10-CM | POA: Diagnosis present

## 2015-08-30 MED ORDER — KETOROLAC TROMETHAMINE 60 MG/2ML IM SOLN
60.0000 mg | Freq: Once | INTRAMUSCULAR | Status: AC
  Administered 2015-08-30: 60 mg via INTRAMUSCULAR
  Filled 2015-08-30: qty 2

## 2015-08-30 MED ORDER — LIDOCAINE 5 % EX PTCH
1.0000 | MEDICATED_PATCH | CUTANEOUS | Status: DC
  Administered 2015-08-30: 1 via TRANSDERMAL
  Filled 2015-08-30: qty 1

## 2015-08-30 MED ORDER — HYDROMORPHONE HCL 2 MG PO TABS
4.0000 mg | ORAL_TABLET | ORAL | Status: AC
  Administered 2015-08-30: 4 mg via ORAL
  Filled 2015-08-30: qty 2

## 2015-08-30 MED ORDER — HYDROMORPHONE HCL 2 MG/ML IJ SOLN
2.0000 mg | Freq: Once | INTRAMUSCULAR | Status: AC
  Administered 2015-08-30: 2 mg via INTRAMUSCULAR
  Filled 2015-08-30: qty 1

## 2015-08-30 NOTE — ED Triage Notes (Signed)
Pt c/o increasing chronic lower back pain radiating into both legs.  Pain score 10/10.  Pt reports that she called her pain doctor and was directed to the ED to "get it under control."

## 2015-08-30 NOTE — ED Provider Notes (Signed)
[facility]-EMER[facility]ENC[facility] DEPT Provider Note   CSN: [id number] Arrival date & time: 08/30/15  1224  By signing my name below, I, Gasper Sells [facility], attest that this documentation has been prepared under the direction and in the presence of [facility], PA-C. Electronically Signed: Javier Docker, ER Scribe. 08/20/2015. 2:40 PM.   History   Chief Complaint Chief Complaint  Patient presents with  . Back Pain  . Leg Pain   The history is provided by the patient. No language interpreter was used.   HPI Comments: Judith Hardy is a 45 y.o. female who presents to the Emergency Department complaining of intermittently severe lower back pain for the last two months that radiates down her bilateral legs.  She has a past surgical hx of lumbar diskectomy in 2013, and a total left hip replacement this year. The pain is across her lower back, worse on the left, she has chronic weakness and numbness in her legs, worse on left.  Was seen in the ED 8/13, 8/15, 8/17, 8/18 for similar symptoms.  She had an MRI lumbar spine on august 15th in the ER that was unremarkable. Orthopedist who performed surgery was Dr Shelle Iron.  Pt is also in pain management and states they have told her to come to ED for her symptoms.  Does not have her full prescription of dilaudid - states she only took one pill yesterday and it is written for her to have it four times daily.  Has follow up appointment with pain management this week.  Had #120 Dilaudid 4mg  pills filled 08/08/15 and reported spilling and losing them at a prior ED visit.     Past Medical History:  Diagnosis Date  . Anxiety   . Arthritis   . Bursitis   . Chronic lower back pain   . Depression   . Osteoarthritis   . Pneumonia 12/16  . Sciatica     Patient Active Problem List   Diagnosis Date Noted  . Primary osteoarthritis of left hip 03/08/2015  . CAP (community acquired pneumonia) 12/24/2014  . Nausea & vomiting 12/24/2014  . Postoperative abdominal pain 05/27/2014    . HNP (herniated nucleus pulposus), lumbar 08/02/2011    Past Surgical History:  Procedure Laterality Date  . APPENDECTOMY  2011  . BACK SURGERY    . HERNIA REPAIR  4/16   umbilical  . JOINT REPLACEMENT    . LUMBAR LAMINECTOMY/DECOMPRESSION MICRODISCECTOMY  01/10/2011   Procedure: LUMBAR LAMINECTOMY/DECOMPRESSION MICRODISCECTOMY;  Surgeon: Javier Docker;  Location: [facility];  Service: Orthopedics;  Laterality: N/A;  Decompression L4 - L5  (X-Ray)  . LUMBAR LAMINECTOMY/DECOMPRESSION MICRODISCECTOMY  08/02/2011   Procedure: LUMBAR LAMINECTOMY/DECOMPRESSION MICRODISCECTOMY;  Surgeon: Javier Docker, MD;  Location: [facility];  Service: Orthopedics;  Laterality: N/A;  Re-do Decompression L4-L5  . TOTAL HIP ARTHROPLASTY Left 03/08/2015   ANTERIOR APPROACH  . TOTAL HIP ARTHROPLASTY Left 03/08/2015   Procedure: TOTAL HIP ARTHROPLASTY ANTERIOR APPROACH;  Surgeon: Marcene Corning, MD;  Location: [facility];  Service: Orthopedics;  Laterality: Left;    OB History    No data available       Home Medications    Prior to Admission medications   Medication Sig Start Date End Date Taking? Authorizing Provider  clonazePAM (KLONOPIN) 1 MG tablet Take 1 mg by mouth 2 (two) times daily.    Historical Provider, MD  gabapentin (NEURONTIN) 300 MG capsule Take 600 mg by mouth at bedtime.     Historical Provider, MD  Janett Billow  POWD Take 8 oz by mouth daily with breakfast. Pt Drinks a cup of Green Tea every morning    Historical Provider, MD  HYDROmorphone (DILAUDID) 4 MG tablet Take 1 tablet (4 mg total) by mouth every 6 (six) hours as needed for severe pain. 05/20/15   Bethann BerkshireJoseph Zammit, MD  ibuprofen (ADVIL,MOTRIN) 200 MG tablet Take 800 mg by mouth every 6 (six) hours as needed for fever, headache, mild pain, moderate pain or cramping.    Historical Provider, MD  LYSINE PO Take 3 tablets by mouth daily with breakfast.    Historical Provider, MD  meclizine (ANTIVERT) 25 MG tablet Take 1 tablet (25 mg total) by mouth 3  (three) times daily as needed for dizziness. Patient not taking: Reported on 08/20/2015 08/04/15   Jaynie Crumbleatyana Kirichenko, PA-C  meloxicam (MOBIC) 15 MG tablet Take 1 tablet (15 mg total) by mouth daily. 08/04/15   [name], PA-C  methocarbamol (ROBAXIN) 500 MG tablet Take 1 tablet (500 mg total) by mouth every 6 (six) hours as needed for muscle spasms. Patient not taking: Reported on 08/06/2015 03/10/15   Elodia FlorenceAndrew Nida, PA-C  methocarbamol (ROBAXIN) 750 MG tablet Take 750 mg by mouth 2 (two) times daily as needed for muscle spasms.     Historical Provider, MD  methylPREDNISolone (MEDROL) 4 MG tablet Take by mouth daily. Pt has 21 day taper dose paper    Historical Provider, MD  ondansetron (ZOFRAN ODT) 4 MG disintegrating tablet 4mg  ODT q4 hours prn nausea/vomit Patient taking differently: Take 4 mg by mouth every 6 (six) hours as needed for nausea or vomiting.  05/20/15   Bethann BerkshireJoseph Zammit, MD  ondansetron (ZOFRAN) 4 MG tablet Take 1 tablet (4 mg total) by mouth every 6 (six) hours. Patient not taking: Reported on 08/20/2015 08/04/15   Jaynie Crumbleatyana Kirichenko, PA-C  traZODone (DESYREL) 150 MG tablet Take 225 mg by mouth at bedtime.  03/04/15   Historical Provider, MD  venlafaxine XR (EFFEXOR-XR) 150 MG 24 hr capsule Take 150 mg by mouth daily with breakfast.  09/21/14   Historical Provider, MD    Family History Family History  Problem Relation Age of Onset  . Heart failure Mother     Social History Social History  Substance Use Topics  . Smoking status: Never Smoker  . Smokeless tobacco: Never Used     Comment: tried smoking 3 days 2012  . Alcohol use No     Allergies   Acetaminophen; Other; Tramadol; and Hydrocodone   Review of Systems Review of Systems  Constitutional: Negative for chills and fever.  Gastrointestinal: Negative for abdominal pain, blood in stool, constipation and diarrhea.  Genitourinary: Negative for dysuria, frequency, urgency, vaginal bleeding and vaginal discharge.    Musculoskeletal: Positive for arthralgias, back pain and myalgias.  Skin: Negative for color change and wound.  Neurological: Positive for weakness and numbness.  Psychiatric/Behavioral: Negative for self-injury.    Physical Exam Updated Vital Signs BP (!) 131/107 (BP Location: Left Arm)   Pulse 88   Temp 98.7 F (37.1 C) (Oral)   Resp 16   Ht 5\' 7"  (1.702 m)   Wt 91.6 kg   LMP 01/15/2008 Comment: pt signed pregnancy waiver  SpO2 99%   BMI 31.64 kg/m   Physical Exam  Constitutional: She is oriented to person, place, and time. She appears well-developed and well-nourished.  HENT:  Head: Normocephalic and atraumatic.  Right Ear: External ear normal.  Left Ear: External ear normal.  Eyes: Conjunctivae and EOM are normal.  Pupils are equal, round, and reactive to light.  Neck: Normal range of motion and phonation normal. Neck supple.  Cardiovascular: Normal rate, regular rhythm and normal heart sounds.   Pulmonary/Chest: Effort normal and breath sounds normal. She exhibits no bony tenderness.  Abdominal: Soft. There is no tenderness.  Musculoskeletal: Normal range of motion.  Diffuse tenderness throughout the lower back.  Slight decrease in strength of left leg vs right secondary to pain.  Sensation intact.  Distal pulses intact.  Pt able to bear weight but with pain.    Neurological: She is alert and oriented to person, place, and time. No cranial nerve deficit or sensory deficit. She exhibits normal muscle tone. Coordination normal.  Skin: Skin is warm, dry and intact.  Psychiatric: She has a normal mood and affect. Her behavior is normal. Judgment and thought content normal.  Nursing note and vitals reviewed.   ED Treatments / Results  Labs (all labs ordered are listed, but only abnormal results are displayed) Labs Reviewed - No data to display  EKG  EKG Interpretation None       Radiology Dg Lumbar Spine Complete  Result Date: 08/30/2015 CLINICAL DATA:  Chronic  back pain, with acute worsening of the fall 1 week ago. EXAM: LUMBAR SPINE - COMPLETE 4+ VIEW COMPARISON:  MRI of the lumbosacral spine 08/23/2015 FINDINGS: There is no evidence of lumbar spine fracture. Alignment is normal. Minimal osteoarthritic changes at L4-L5 with prior left laminectomy. Facet arthropathy in the lower lumbosacral spine. An IUD is in place.  Left hip arthroplasty is partially visualized. IMPRESSION: No acute osteolysis or osteo listhesis of the lumbosacral spine. Mild osteoarthritic changes at L4-L5. Electronically Signed   By: Ted Mcalpine M.D.   On: 08/30/2015 16:21    Procedures Procedures (including critical care time)  Medications Ordered in ED Medications  lidocaine (LIDODERM) 5 % 1 patch (1 patch Transdermal Patch Applied 08/30/15 1641)  ketorolac (TORADOL) injection 60 mg (60 mg Intramuscular Given 08/30/15 1539)  HYDROmorphone (DILAUDID) tablet 4 mg (4 mg Oral Given 08/30/15 1640)  HYDROmorphone (DILAUDID) injection 2 mg (2 mg Intramuscular Given 08/30/15 1756)     Initial Impression / Assessment and Plan / ED Course  I have reviewed the triage vital signs and the nursing notes.  Pertinent labs & imaging results that were available during my care of the patient were reviewed by me and considered in my medical decision making (see chart for details).  Clinical Course    Afebrile, nontoxic patient with exacerbation of chronic back pain.  Pt did have fall while in ED last week and no imaging since.  Xray negative.  No red flags with history or exam.  Neurovascularly intact.  Pt has unfortunately run out of her chronic pain medication Dilaudid prior to her appointment and has also had a recent fall.  Has had recent reassuring MRI and xray today.  Suspect this is acute on chronic low back pain with radiculopathy combined with possible modest withdrawal from chronic narcotics.  Pain treated in ED.  D/C home with orthopedic, pain management follow up.  Discussed  result, findings, treatment, and follow up  with patient.  Pt given return precautions.  Pt verbalizes understanding and agrees with plan.          Final Clinical Impressions(s) / ED Diagnoses   Final diagnoses:  Bilateral low back pain, with sciatica presence unspecified    New Prescriptions Discharge Medication List as of 08/30/2015  6:06 PM  I personally performed the services described in this documentation, which was scribed in my presence. The recorded information has been reviewed and is accurate.        Trixie Dredgemily Damon Hargrove, PA-C 08/30/15 1843    Cathren LaineKevin Steinl, MD 09/01/15 [phone]

## 2015-08-30 NOTE — ED Triage Notes (Signed)
Pt was ambulated in room with assistance . Did not tolerate activity well. PA at chairside. Pt c/o severe back and leg pain

## 2015-08-30 NOTE — Discharge Instructions (Signed)
Read the information below.  You may return to the Emergency Department at any time for worsening condition or any new symptoms that concern you.    If you develop fevers, loss of control of bowel or bladder, weakness or numbness in your legs, or are unable to walk, return to the ER for a recheck.  °

## 2015-08-30 NOTE — Progress Notes (Signed)
Pt was given a pillow to place behind her back. Pt does state she has some pain shooting down both legs.

## 2015-09-01 ENCOUNTER — Emergency Department (HOSPITAL_COMMUNITY): Payer: Medicare HMO

## 2015-09-01 ENCOUNTER — Emergency Department (HOSPITAL_COMMUNITY)
Admission: EM | Admit: 2015-09-01 | Discharge: 2015-09-01 | Disposition: A | Discharge status: Home / Self Care | Payer: Medicare HMO | Attending: Emergency Medicine | Admitting: Emergency Medicine

## 2015-09-01 ENCOUNTER — Encounter (HOSPITAL_COMMUNITY): Payer: Self-pay | Admitting: Emergency Medicine

## 2015-09-01 DIAGNOSIS — M25511 Pain in right shoulder: Secondary | ICD-10-CM | POA: Diagnosis not present

## 2015-09-01 DIAGNOSIS — M542 Cervicalgia: Secondary | ICD-10-CM | POA: Insufficient documentation

## 2015-09-01 DIAGNOSIS — M5440 Lumbago with sciatica, unspecified side: Secondary | ICD-10-CM | POA: Diagnosis not present

## 2015-09-01 DIAGNOSIS — S0990XA Unspecified injury of head, initial encounter: Secondary | ICD-10-CM | POA: Diagnosis present

## 2015-09-01 DIAGNOSIS — Y9389 Activity, other specified: Secondary | ICD-10-CM | POA: Insufficient documentation

## 2015-09-01 DIAGNOSIS — W109XXA Fall (on) (from) unspecified stairs and steps, initial encounter: Secondary | ICD-10-CM | POA: Insufficient documentation

## 2015-09-01 DIAGNOSIS — Y999 Unspecified external cause status: Secondary | ICD-10-CM | POA: Insufficient documentation

## 2015-09-01 DIAGNOSIS — M5441 Lumbago with sciatica, right side: Secondary | ICD-10-CM

## 2015-09-01 DIAGNOSIS — S93402A Sprain of unspecified ligament of left ankle, initial encounter: Secondary | ICD-10-CM | POA: Insufficient documentation

## 2015-09-01 DIAGNOSIS — Y929 Unspecified place or not applicable: Secondary | ICD-10-CM | POA: Insufficient documentation

## 2015-09-01 DIAGNOSIS — M5442 Lumbago with sciatica, left side: Secondary | ICD-10-CM

## 2015-09-01 MED ORDER — HYDROMORPHONE HCL 1 MG/ML IJ SOLN
1.0000 mg | Freq: Once | INTRAMUSCULAR | Status: AC
  Administered 2015-09-01: 1 mg via INTRAVENOUS
  Filled 2015-09-01: qty 1

## 2015-09-01 MED ORDER — KETOROLAC TROMETHAMINE 30 MG/ML IJ SOLN
30.0000 mg | Freq: Once | INTRAMUSCULAR | Status: AC
  Administered 2015-09-01: 30 mg via INTRAVENOUS
  Filled 2015-09-01: qty 1

## 2015-09-01 MED ORDER — OXYCODONE HCL 5 MG PO TABS: 5.0000 mg | ORAL_TABLET | ORAL | 0 refills | Status: DC | PRN

## 2015-09-01 NOTE — ED Triage Notes (Addendum)
Per EMS, pt from home reports falling down three stairs after a "sciatica attack." Pt states she hit her right head on the concrete, denies LOC. Pt also reports left leg, right shoulder, and lower back pain. Pt reports one episode of vomiting at time of fall. Hx sciatica. 4mg  Zofran with EMS.

## 2015-09-01 NOTE — ED Provider Notes (Signed)
[facility]-EMERGENCY DEPT Provider Note   CSN: [id number] Arrival date & time: 09/01/15  1159     History   Chief Complaint Chief Complaint  Patient presents with  . Fall  . Head Injury  . Shoulder Pain  . Leg Pain    HPI Judith Hardy is a 45 y.o. female.  Patient with past medical history remarkable for chronic low back pain presents to the emergency department with chief complaint of fall down 3 stairs. She states that she was going out stairs this morning, when her sciatica pain flared causing her to collapse and fall down the stairs. She complains of headache from hitting her head, neck pain, right shoulder pain, left ankle pain, and constant chronic low back pain. She denies bowel or bladder incontinence. She has been seen numerous times for low back pain, and has had an MRI as recently as 08/23/2015. She states that she recently fell while she was in the hospital after having had the MRI, and complaints of worsening low back pain since this fall. She is tried taking meloxicam and Robaxin with no relief.   The history is provided by the patient. No language interpreter was used.    Past Medical History:  Diagnosis Date  . Anxiety   . Arthritis   . Bursitis   . Chronic lower back pain   . Depression   . Osteoarthritis   . Pneumonia 12/16  . Sciatica     Patient Active Problem List   Diagnosis Date Noted  . Primary osteoarthritis of left hip 03/08/2015  . CAP (community acquired pneumonia) 12/24/2014  . Nausea & vomiting 12/24/2014  . Postoperative abdominal pain 05/27/2014  . HNP (herniated nucleus pulposus), lumbar 08/02/2011    Past Surgical History:  Procedure Laterality Date  . APPENDECTOMY  2011  . BACK SURGERY    . HERNIA REPAIR  4/16   umbilical  . JOINT REPLACEMENT    . LUMBAR LAMINECTOMY/DECOMPRESSION MICRODISCECTOMY  01/10/2011   Procedure: LUMBAR LAMINECTOMY/DECOMPRESSION MICRODISCECTOMY;  Surgeon: [name];  Location: [facility];  Service:  Orthopedics;  Laterality: N/A;  Decompression L4 - L5  (X-Ray)  . LUMBAR LAMINECTOMY/DECOMPRESSION MICRODISCECTOMY  08/02/2011   Procedure: LUMBAR LAMINECTOMY/DECOMPRESSION MICRODISCECTOMY;  Surgeon: [name], MD;  Location: [facility];  Service: Orthopedics;  Laterality: N/A;  Re-do Decompression L4-L5  . TOTAL HIP ARTHROPLASTY Left 03/08/2015   ANTERIOR APPROACH  . TOTAL HIP ARTHROPLASTY Left 03/08/2015   Procedure: TOTAL HIP ARTHROPLASTY ANTERIOR APPROACH;  Surgeon: [name], MD;  Location: [facility];  Service: Orthopedics;  Laterality: Left;    OB History    No data available       Home Medications    Prior to Admission medications   Medication Sig Start Date End Date Taking? Authorizing Provider  clonazePAM (KLONOPIN) 1 MG tablet Take 1 mg by mouth 2 (two) times daily.   Yes Historical Provider, MD  gabapentin (NEURONTIN) 300 MG capsule Take 600 mg by mouth at bedtime.    Yes Historical Provider, MD  Chilton SiGreen Tea POWD Take 8 oz by mouth daily with breakfast. Pt Drinks a cup of Green Tea every morning   Yes Historical Provider, MD  HYDROmorphone (DILAUDID) 4 MG tablet Take 1 tablet (4 mg total) by mouth every 6 (six) hours as needed for severe pain. 05/20/15  Yes Bethann BerkshireJoseph Zammit, MD  LYSINE PO Take 3 tablets by mouth daily with breakfast.   Yes Historical Provider, MD  meloxicam (MOBIC) 15 MG tablet Take 1 tablet (  15 mg total) by mouth daily. 08/04/15  Yes [name], PA-C  methocarbamol (ROBAXIN) 750 MG tablet Take 750 mg by mouth 2 (two) times daily as needed for muscle spasms.    Yes Historical Provider, MD  ondansetron (ZOFRAN ODT) 4 MG disintegrating tablet 4mg  ODT q4 hours prn nausea/vomit Patient taking differently: Take 4 mg by mouth every 6 (six) hours as needed for nausea or vomiting.  05/20/15  Yes Bethann BerkshireJoseph Zammit, MD  traZODone (DESYREL) 150 MG tablet Take 225 mg by mouth at bedtime.  03/04/15  Yes Historical Provider, MD  venlafaxine XR (EFFEXOR-XR) 150 MG 24 hr capsule  Take 150 mg by mouth daily with breakfast.  09/21/14  Yes Historical Provider, MD  meclizine (ANTIVERT) 25 MG tablet Take 1 tablet (25 mg total) by mouth 3 (three) times daily as needed for dizziness. Patient not taking: Reported on 08/20/2015 08/04/15   Lemont Fillersatyana Kirichenko, PA-C  methocarbamol (ROBAXIN) 500 MG tablet Take 1 tablet (500 mg total) by mouth every 6 (six) hours as needed for muscle spasms. Patient not taking: Reported on 08/06/2015 03/10/15   Elodia FlorenceAndrew Nida, PA-C  ondansetron (ZOFRAN) 4 MG tablet Take 1 tablet (4 mg total) by mouth every 6 (six) hours. Patient not taking: Reported on 08/20/2015 08/04/15   Jaynie Crumbleatyana Kirichenko, PA-C    Family History Family History  Problem Relation Age of Onset  . Heart failure Mother     Social History Social History  Substance Use Topics  . Smoking status: Never Smoker  . Smokeless tobacco: Never Used     Comment: tried smoking 3 days 2012  . Alcohol use No     Allergies   Acetaminophen; Other; Tramadol; and Hydrocodone   Review of Systems Review of Systems  All other systems reviewed and are negative.    Physical Exam Updated Vital Signs BP 121/87 (BP Location: Right Arm)   Pulse 62   Temp 98.6 F (37 C) (Oral)   Resp 15   Ht 5\' 7"  (1.702 m)   Wt 93 kg   LMP 01/15/2008 Comment: pt signed pregnancy waiver  SpO2 100%   BMI 32.11 kg/m   Physical Exam Physical Exam  Constitutional: Pt appears well-developed and well-nourished. No distress.  HENT:  Head: Normocephalic and atraumatic.  Mouth/Throat: Oropharynx is clear and moist. No oropharyngeal exudate.  Eyes: Conjunctivae are normal.  Neck: Normal range of motion. Neck supple.  No meningismus Cardiovascular: Normal rate, regular rhythm and intact distal pulses.   Pulmonary/Chest: Effort normal and breath sounds normal. No respiratory distress. Pt has no wheezes.  Abdominal: Pt exhibits no distension Musculoskeletal:  Lumbar spine tender to palpation, no bony CTLS spine  tenderness, deformity, step-off, or crepitus Lymphadenopathy: Pt has no cervical adenopathy.  Neurological: Pt is alert and oriented Speech is clear and goal oriented, follows commands Normal 5/5 strength in upper and lower extremities bilaterally including dorsiflexion and plantar flexion, strong and equal grip strength Sensation intact Great toe extension intact Moves extremities without ataxia, coordination intact Ankle and knee jerk reflexes intact and symmetrical  Normal gait Normal balance No Clonus Skin: Skin is warm and dry. No rash noted. Pt is not diaphoretic. No erythema.  Psychiatric: Pt has a normal mood and affect. Behavior is normal.  Nursing note and vitals reviewed.   ED Treatments / Results  Labs (all labs ordered are listed, but only abnormal results are displayed) Labs Reviewed - No data to display  EKG  EKG Interpretation None  Radiology Dg Lumbar Spine Complete  Result Date: 08/30/2015 CLINICAL DATA:  Chronic back pain, with acute worsening of the fall 1 week ago. EXAM: LUMBAR SPINE - COMPLETE 4+ VIEW COMPARISON:  MRI of the lumbosacral spine 08/23/2015 FINDINGS: There is no evidence of lumbar spine fracture. Alignment is normal. Minimal osteoarthritic changes at L4-L5 with prior left laminectomy. Facet arthropathy in the lower lumbosacral spine. An IUD is in place.  Left hip arthroplasty is partially visualized. IMPRESSION: No acute osteolysis or osteo listhesis of the lumbosacral spine. Mild osteoarthritic changes at L4-L5. Electronically Signed   By: Ted Mcalpine M.D.   On: 08/30/2015 16:21    Procedures Procedures (including critical care time)  Medications Ordered in ED Medications  HYDROmorphone (DILAUDID) injection 1 mg (not administered)     Initial Impression / Assessment and Plan / ED Course  I have reviewed the triage vital signs and the nursing notes.  Pertinent labs & imaging results that were available during my care of  the patient were reviewed by me and considered in my medical decision making (see chart for details).  Clinical Course    Patient with acute on chronic low back pain. She had a recent MRI on 8/15 which showed no emergent findings. Patient states that she had a mechanical fall since then. I discussed case with Dr. Erma Heritage, who recommends lumbar spine CT to rule out bony injury. CT is consistent with degenerative changes and chronic changes, but no acute findings. Additional imaging ordered today because of fall. These studies have also been negative for acute findings. Will recommend that the patient follow-up with her orthopedic doctor. We'll treat her for ankle sprain.  Final Clinical Impressions(s) / ED Diagnoses   Final diagnoses:  Head injury, initial encounter  Right shoulder pain  Left ankle sprain, initial encounter  Bilateral low back pain with sciatica, sciatica laterality unspecified  Neck pain    New Prescriptions New Prescriptions   OXYCODONE (ROXICODONE) 5 MG IMMEDIATE RELEASE TABLET    Take 1 tablet (5 mg total) by mouth every 4 (four) hours as needed for severe pain.     Roxy Horseman, PA-C 09/01/15 1515    Shaune Pollack, MD 09/02/15 [phone]

## 2015-09-01 NOTE — ED Notes (Signed)
Bed: [facility]02 Expected date:  Expected time:  Means of arrival:  Comments: EMS 45 yo fall/head injury

## 2015-09-01 NOTE — ED Notes (Signed)
Patient transported to X-ray & CT °

## 2015-09-03 ENCOUNTER — Emergency Department (HOSPITAL_COMMUNITY)
Admission: EM | Admit: 2015-09-03 | Discharge: 2015-09-03 | Disposition: A | Discharge status: Home / Self Care | Payer: Medicare HMO | Attending: Emergency Medicine | Admitting: Emergency Medicine

## 2015-09-03 ENCOUNTER — Encounter (HOSPITAL_COMMUNITY): Payer: Self-pay | Admitting: Emergency Medicine

## 2015-09-03 DIAGNOSIS — M549 Dorsalgia, unspecified: Secondary | ICD-10-CM

## 2015-09-03 DIAGNOSIS — M545 Low back pain: Secondary | ICD-10-CM | POA: Diagnosis not present

## 2015-09-03 DIAGNOSIS — G8929 Other chronic pain: Secondary | ICD-10-CM | POA: Insufficient documentation

## 2015-09-03 DIAGNOSIS — Z96642 Presence of left artificial hip joint: Secondary | ICD-10-CM | POA: Insufficient documentation

## 2015-09-03 MED ORDER — HYDROMORPHONE HCL 1 MG/ML IJ SOLN
1.0000 mg | Freq: Once | INTRAMUSCULAR | Status: AC
  Administered 2015-09-03: 1 mg via INTRAVENOUS
  Filled 2015-09-03: qty 1

## 2015-09-03 NOTE — ED Provider Notes (Signed)
Complains of low back pain radiating to left foot and to mid left thigh for several weeks. Pain worse with movement. Or walking. No other associated symptoms. On exam appears uncomfortable. Walks with cane without difficulty    Doug SouSam Dola Lunsford, MD 09/03/15 1510

## 2015-09-03 NOTE — ED Provider Notes (Signed)
[facility]-EMERGENCY DEPT Provider Note   CSN: [id number] Arrival date & time: 09/03/15  1253  By signing my name below, I, Judith Hardy, attest that this documentation has been prepared under the direction and in the presence of Judith Loll, PA-C. Electronically signed by: Judith Hardy, ED Scribe. 09/03/15. 2:42 PM.  History   Chief Complaint Chief Complaint  Patient presents with  . Back Pain   The history is provided by the patient. No language interpreter was used.   HPI Comments:  Judith Hardy is a 45 y.o. female with a history of chronic low back pain and two lumbar laminectomies who presents to the Emergency Department complaining of gradually worsening back pain, which started a month ago. Pain radiates across lower back and down bilateral legs. She was seen multiple times in the ED for similar symptoms and reports that she is typically given an IV of Dilaudid, Ativan and Toradol. Associated symptoms include feeling of urinary urgency, nausea and numbness and tingling in left leg. Pt takes Dilaudid prescribed by pain clinic, which provides some relief. She also take Ibuprofen and applies heat, with no relief. Denies fever, loss of control of bladder or bowel and numbness in groin, blood in stool or urine, SOB, chest pain. Pain clinic told pt to get pain managed temporarily at ED until doctor returns to town. Pt is planning to call orthopedics on Monday. No new trauma to back since last ED visit. Patient given oxycodone in ER on 8/24 that she states does not help the pain.   Past Medical History:  Diagnosis Date  . Anxiety   . Arthritis   . Bursitis   . Chronic lower back pain   . Depression   . Osteoarthritis   . Pneumonia 12/16  . Sciatica     Patient Active Problem List   Diagnosis Date Noted  . Primary osteoarthritis of left hip 03/08/2015  . CAP (community acquired pneumonia) 12/24/2014  . Nausea & vomiting 12/24/2014  . Postoperative abdominal pain 05/27/2014  . HNP  (herniated nucleus pulposus), lumbar 08/02/2011    Past Surgical History:  Procedure Laterality Date  . APPENDECTOMY  2011  . BACK SURGERY    . HERNIA REPAIR  4/16   umbilical  . JOINT REPLACEMENT    . LUMBAR LAMINECTOMY/DECOMPRESSION MICRODISCECTOMY  01/10/2011   Procedure: LUMBAR LAMINECTOMY/DECOMPRESSION MICRODISCECTOMY;  Surgeon: Judith Hardy;  Location: [facility];  Service: Orthopedics;  Laterality: N/A;  Decompression L4 - L5  (X-Ray)  . LUMBAR LAMINECTOMY/DECOMPRESSION MICRODISCECTOMY  08/02/2011   Procedure: LUMBAR LAMINECTOMY/DECOMPRESSION MICRODISCECTOMY;  Surgeon: Judith Docker, MD;  Location: [facility];  Service: Orthopedics;  Laterality: N/A;  Re-do Decompression L4-L5  . TOTAL HIP ARTHROPLASTY Left 03/08/2015   ANTERIOR APPROACH  . TOTAL HIP ARTHROPLASTY Left 03/08/2015   Procedure: TOTAL HIP ARTHROPLASTY ANTERIOR APPROACH;  Surgeon: Judith Corning, MD;  Location: [facility];  Service: Orthopedics;  Laterality: Left;    OB History    No data available       Home Medications    Prior to Admission medications   Medication Sig Start Date End Date Taking? Authorizing Provider  clonazePAM (KLONOPIN) 1 MG tablet Take 1 mg by mouth 2 (two) times daily.    Historical Provider, MD  gabapentin (NEURONTIN) 300 MG capsule Take 600 mg by mouth at bedtime.     Historical Provider, MD  Judith Hardy POWD Take 8 oz by mouth daily with breakfast. Pt Drinks a cup of Green Tea every morning  Historical Provider, MD  HYDROmorphone (DILAUDID) 4 MG tablet Take 1 tablet (4 mg total) by mouth every 6 (six) hours as needed for severe pain. 05/20/15   Bethann BerkshireJoseph Zammit, MD  LYSINE PO Take 3 tablets by mouth daily with breakfast.    Historical Provider, MD  meclizine (ANTIVERT) 25 MG tablet Take 1 tablet (25 mg total) by mouth 3 (three) times daily as needed for dizziness. Patient not taking: Reported on 08/20/2015 08/04/15   Judith Crumbleatyana Kirichenko, PA-C  meloxicam (MOBIC) 15 MG tablet Take 1 tablet (15 mg total) by  mouth daily. 08/04/15   Judith Kirichenko, PA-C  methocarbamol (ROBAXIN) 500 MG tablet Take 1 tablet (500 mg total) by mouth every 6 (six) hours as needed for muscle spasms. Patient not taking: Reported on 08/06/2015 03/10/15   Judith FlorenceAndrew Nida, PA-C  methocarbamol (ROBAXIN) 750 MG tablet Take 750 mg by mouth 2 (two) times daily as needed for muscle spasms.     Historical Provider, MD  ondansetron (ZOFRAN ODT) 4 MG disintegrating tablet 4mg  ODT q4 hours prn nausea/vomit Patient taking differently: Take 4 mg by mouth every 6 (six) hours as needed for nausea or vomiting.  05/20/15   Bethann BerkshireJoseph Zammit, MD  ondansetron (ZOFRAN) 4 MG tablet Take 1 tablet (4 mg total) by mouth every 6 (six) hours. Patient not taking: Reported on 08/20/2015 08/04/15   Judith Kirichenko, PA-C  oxyCODONE (ROXICODONE) 5 MG immediate release tablet Take 1 tablet (5 mg total) by mouth every 4 (four) hours as needed for severe pain. 09/01/15   Judith Horsemanobert Browning, PA-C  traZODone (DESYREL) 150 MG tablet Take 225 mg by mouth at bedtime.  03/04/15   Historical Provider, MD  venlafaxine XR (EFFEXOR-XR) 150 MG 24 hr capsule Take 150 mg by mouth daily with breakfast.  09/21/14   Historical Provider, MD    Family History Family History  Problem Relation Age of Onset  . Heart failure Mother     Social History Social History  Substance Use Topics  . Smoking status: Never Smoker  . Smokeless tobacco: Never Used     Comment: tried smoking 3 days 2012  . Alcohol use No     Allergies   Acetaminophen; Other; Tramadol; and Hydrocodone   Review of Systems Review of Systems  Constitutional: Negative for fever.  Respiratory: Negative for shortness of breath.   Cardiovascular: Negative for chest pain.  Gastrointestinal: Positive for nausea. Negative for abdominal pain, diarrhea and vomiting.  Genitourinary: Negative for dysuria.  Musculoskeletal: Positive for arthralgias, back pain and myalgias.  Neurological: Positive for numbness ( left  leg).  All other systems reviewed and are negative.    Physical Exam Updated Vital Signs BP 127/75 (BP Location: Right Arm)   Pulse 95   Temp 97.7 F (36.5 C) (Oral)   Resp 18   Ht 5\' 7"  (1.702 m)   Wt 223 lb 3 oz (101.2 kg)   LMP 01/15/2008 Comment: pt signed pregnancy waiver 09-01-2015  SpO2 99%   BMI 34.96 kg/m   Physical Exam  Constitutional: She is oriented to person, place, and time. She appears well-developed and well-nourished. No distress.  Patient ambulating with cane to go to the bathroom.  HENT:  Head: Normocephalic and atraumatic.  Eyes: Conjunctivae are normal. Right eye exhibits no discharge. Left eye exhibits no discharge. No scleral icterus.  Cardiovascular: Normal rate and intact distal pulses.   Pulmonary/Chest: Effort normal.  Abdominal: Soft. Bowel sounds are normal.  Musculoskeletal: Normal range of motion. She exhibits tenderness.  Diffuse tenderness throughout the lower back.  Slight decrease in strength of left leg vs right secondary to pain baseline for patient.  Sensation intact.  Distal pulses intact.  Pt able to bear weight but with pain. Walks with cane. Unable to assess straight leg test due to pain.   Neurological: She is alert and oriented to person, place, and time. No sensory deficit. Coordination normal.  Skin: Skin is warm and dry. Capillary refill takes less than 2 seconds. No rash noted. She is not diaphoretic. No erythema. No pallor.  Psychiatric: She has a normal mood and affect. Her behavior is normal.  Nursing note and vitals reviewed.    ED Treatments / Results  DIAGNOSTIC STUDIES:  Oxygen Saturation is 99% on room air, normal by my interpretation.    COORDINATION OF CARE:  2:36 PM Discussed treatment plan with pt at bedside, which include following up with orthopedics and pain clinic, and pt agreed to plan.  Labs (all labs ordered are listed, but only abnormal results are displayed) Labs Reviewed - No data to display  EKG   EKG Interpretation None       Radiology  Procedures Procedures (including critical care time)  Medications Ordered in ED Medications  HYDROmorphone (DILAUDID) injection 1 mg (1 mg Intravenous Given 09/03/15 1540)     Initial Impression / Assessment and Plan / ED Course  I have reviewed the triage vital signs and the nursing notes.  Pertinent labs & imaging results that were available during my care of the patient were reviewed by me and considered in my medical decision making (see chart for details).  Clinical Course  Patient with chronic back pain.  No neurological deficits and normal neuro exam.  Patient can walk but states is painful.  No loss of bowel or bladder control.  No concern for cauda equina.  No fever, night sweats, weight loss, h/o cancer, IVDU.  Patient stating pain clinic sent her to ER to have temporary management of pain until physician returns from vacation. Dicussed plan of care with Dr. Ethelda Chick who examined patient. IV dilaudid given in the ER per his instruction. CT imaging preformed on 8/24 with no acute findings. Stable chronic changes noted. Patient denies new trauma. Patient states pain slightly improved. Encouraged patient to take home narcotics when she get home. Follow up with pain clinic on Monday. Patient to schedule appointment with her orthopaedist this week. RICE protocol and pain medicine indicated and discussed with patient. Strict return precautions given. Patient verbalized understanding of follow up instruction. Hemodynamically stable. Discharged home in NAD with stable VS. Final Clinical Impressions(s) / ED Diagnoses   Final diagnoses:  Chronic back pain    New Prescriptions New Prescriptions   No medications on file   I personally performed the services described in this documentation, which was scribed in my presence. The recorded information has been reviewed and is accurate.     Rise Mu, PA-C 09/03/15 1631    Doug Sou, MD 09/03/15 [phone]

## 2015-09-03 NOTE — ED Triage Notes (Signed)
Pt. Went to [facility] earlier

## 2015-09-03 NOTE — ED Triage Notes (Signed)
Pt. Stated, My back pain has flared up for the last qweek half.  Its really killing me the pain.

## 2015-09-03 NOTE — Discharge Instructions (Signed)
Continue taking your po dilaudid at home as prescribed. Follow up with pain management on Monday. Schedule an appointment with orthopaedics this week as instructed. Follow up with primary care provider to further management.

## 2015-09-05 ENCOUNTER — Emergency Department (HOSPITAL_COMMUNITY)
Admission: EM | Admit: 2015-09-05 | Discharge: 2015-09-05 | Disposition: A | Discharge status: Home / Self Care | Payer: Medicare HMO | Attending: Emergency Medicine | Admitting: Emergency Medicine

## 2015-09-05 ENCOUNTER — Encounter (HOSPITAL_COMMUNITY): Payer: Self-pay | Admitting: Emergency Medicine

## 2015-09-05 DIAGNOSIS — M545 Low back pain: Secondary | ICD-10-CM | POA: Insufficient documentation

## 2015-09-05 DIAGNOSIS — G8929 Other chronic pain: Secondary | ICD-10-CM | POA: Insufficient documentation

## 2015-09-05 DIAGNOSIS — Z79899 Other long term (current) drug therapy: Secondary | ICD-10-CM | POA: Insufficient documentation

## 2015-09-05 MED ORDER — HYDROMORPHONE HCL 1 MG/ML IJ SOLN
1.0000 mg | Freq: Once | INTRAMUSCULAR | Status: AC
  Administered 2015-09-05: 1 mg via INTRAMUSCULAR
  Filled 2015-09-05: qty 1

## 2015-09-05 MED ORDER — HYDROMORPHONE HCL 2 MG PO TABS
2.0000 mg | ORAL_TABLET | Freq: Once | ORAL | Status: AC
  Administered 2015-09-05: 2 mg via ORAL
  Filled 2015-09-05: qty 1

## 2015-09-05 MED ORDER — HYDROMORPHONE HCL 1 MG/ML IJ SOLN: 1.0000 mg | INTRAMUSCULAR | Status: DC | PRN

## 2015-09-05 NOTE — Discharge Instructions (Signed)
Please follow with your primary care doctor in the next 2 days for a check-up. They must obtain records for further management.  ° °Do not hesitate to return to the Emergency Department for any new, worsening or concerning symptoms.  ° °

## 2015-09-05 NOTE — ED Provider Notes (Signed)
[facility]-EMERGENCY DEPT Provider Note   CSN: [id number] Arrival date & time: 09/05/15  1124     History   Chief Complaint Chief Complaint  Patient presents with  . Back Pain  . Leg Pain    HPI  Blood pressure 141/90, pulse 84, temperature 98.7 F (37.1 C), temperature source Oral, resp. rate 18, height 5\' 7"  (1.702 m), weight 99.8 kg, last menstrual period 01/15/2008, SpO2 100 %.  Judith Hardy is a 45 y.o. female complaining of exacerbation of her chronic low back pain states that she is out of her pain medication, states that she normally follows at the [facility] however she has been unable to see her physician, because apparently he is out of the country. She states that the mid-level provider there is decreasing her dosage of pain medication and it is been unable to control her pain, she also had a fall in the last week while in the ED and states that that is worsening, the pain is 10 out of 10, is on the low back and radiating down the posterior of the left leg which is not atypical for her. No trauma recently, no fever. Patient is ambulatory with a cane which is typical for her.  HPI  Past Medical History:  Diagnosis Date  . Anxiety   . Arthritis   . Bursitis   . Chronic lower back pain   . Depression   . Osteoarthritis   . Pneumonia 12/16  . Sciatica     Patient Active Problem List   Diagnosis Date Noted  . Primary osteoarthritis of left hip 03/08/2015  . CAP (community acquired pneumonia) 12/24/2014  . Nausea & vomiting 12/24/2014  . Postoperative abdominal pain 05/27/2014  . HNP (herniated nucleus pulposus), lumbar 08/02/2011    Past Surgical History:  Procedure Laterality Date  . APPENDECTOMY  2011  . BACK SURGERY    . HERNIA REPAIR  4/16   umbilical  . JOINT REPLACEMENT    . LUMBAR LAMINECTOMY/DECOMPRESSION MICRODISCECTOMY  01/10/2011   Procedure: LUMBAR LAMINECTOMY/DECOMPRESSION MICRODISCECTOMY;  Surgeon: Javier Docker;  Location: [facility];  Service: Orthopedics;  Laterality: N/A;  Decompression L4 - L5  (X-Ray)  . LUMBAR LAMINECTOMY/DECOMPRESSION MICRODISCECTOMY  08/02/2011   Procedure: LUMBAR LAMINECTOMY/DECOMPRESSION MICRODISCECTOMY;  Surgeon: Javier Docker, MD;  Location: [facility]S;  Service: Orthopedics;  Laterality: N/A;  Re-do Decompression L4-L5  . TOTAL HIP ARTHROPLASTY Left 03/08/2015   ANTERIOR APPROACH  . TOTAL HIP ARTHROPLASTY Left 03/08/2015   Procedure: TOTAL HIP ARTHROPLASTY ANTERIOR APPROACH;  Surgeon: Marcene Corning, MD;  Location: [facility] OR;  Service: Orthopedics;  Laterality: Left;    OB History    No data available       Home Medications    Prior to Admission medications   Medication Sig Start Date End Date Taking? Authorizing Provider  clonazePAM (KLONOPIN) 1 MG tablet Take 1 mg by mouth 2 (two) times daily.   Yes Historical Provider, MD  gabapentin (NEURONTIN) 300 MG capsule Take 600 mg by mouth at bedtime.    Yes Historical Provider, MD  Chilton Si Tea POWD Take 8 oz by mouth daily with breakfast. Pt Drinks a cup of Green Tea every morning   Yes Historical Provider, MD  HYDROmorphone (DILAUDID) 4 MG tablet Take 1 tablet (4 mg total) by mouth every 6 (six) hours as needed for severe pain. 05/20/15  Yes [name], MD  LYSINE PO Take 3 tablets by mouth daily with breakfast.   Yes Historical Provider,  MD  meloxicam (MOBIC) 15 MG tablet Take 1 tablet (15 mg total) by mouth daily. 08/04/15  Yes [name], PA-C  traZODone (DESYREL) 150 MG tablet Take 225 mg by mouth at bedtime.  03/04/15  Yes Historical Provider, MD  venlafaxine XR (EFFEXOR-XR) 150 MG 24 hr capsule Take 150 mg by mouth daily with breakfast.  09/21/14  Yes Historical Provider, MD    Family History Family History  Problem Relation Age of Onset  . Heart failure Mother     Social History Social History  Substance Use Topics  . Smoking status: Never Smoker  . Smokeless tobacco: Never Used     Comment: tried smoking 3 days 2012  .  Alcohol use No     Allergies   Acetaminophen; Other; Tramadol; and Hydrocodone   Review of Systems Review of Systems  10 systems reviewed and found to be negative, except as noted in the HPI.   Physical Exam Updated Vital Signs BP 128/92 (BP Location: Left Arm)   Pulse 73   Temp 97.8 F (36.6 C) (Oral)   Resp 18   Ht 5\' 7"  (1.702 m)   Wt 99.8 kg   LMP 01/15/2008 Comment: pt signed pregnancy waiver 09-01-2015  SpO2 100%   BMI 34.46 kg/m   Physical Exam  Constitutional: She appears well-developed and well-nourished.  HENT:  Head: Normocephalic.  Eyes: Conjunctivae are normal.  Neck: Normal range of motion.  Cardiovascular: Normal rate, regular rhythm and intact distal pulses.   Pulmonary/Chest: Effort normal.  Abdominal: Soft. There is no tenderness.  Neurological: She is alert.  Well-healed remote lumbar surgical scar. No point tenderness to percussion of lumbar spinal processes.  No TTP or paraspinal muscular spasm. Strength is 5 out of 5 to bilateral lower extremities at hip and knee; extensor hallucis longus 5 out of 5. Ankle strength 5 out of 5, no clonus, neurovascularly intact. No saddle anaesthesia. Patellar reflexes are 2+ bilaterally.    Ambulates with a coordinated and slightly antalgic gait with a cane.   Psychiatric: She has a normal mood and affect.  Nursing note and vitals reviewed.    ED Treatments / Results  Labs (all labs ordered are listed, but only abnormal results are displayed) Labs Reviewed - No data to display  EKG  EKG Interpretation None       Radiology No results found.  Procedures Procedures (including critical care time)  Medications Ordered in ED Medications  HYDROmorphone (DILAUDID) injection 1 mg (1 mg Intramuscular Given 09/05/15 1411)  HYDROmorphone (DILAUDID) tablet 2 mg (2 mg Oral Given 09/05/15 1526)     Initial Impression / Assessment and Plan / ED Course  I have reviewed the triage vital signs and the nursing  notes.  Pertinent labs & imaging results that were available during my care of the patient were reviewed by me and considered in my medical decision making (see chart for details).  Clinical Course    Vitals:   09/05/15 1149 09/05/15 1358 09/05/15 1410 09/05/15 1604  BP:  165/84 141/90 128/92  Pulse:  77 84 73  Resp:  18 18 18   Temp:    97.8 F (36.6 C)  TempSrc:    Oral  SpO2:  100% 100% 100%  Weight: 99.8 kg     Height: 5\' 7"  (1.702 m)       Medications  HYDROmorphone (DILAUDID) injection 1 mg (1 mg Intramuscular Given 09/05/15 1411)  HYDROmorphone (DILAUDID) tablet 2 mg (2 mg Oral Given 09/05/15 1526)  Latria Fayrene FearingJames is 45 y.o. female presenting with Exacerbation of her chronic low back pain. Patient follows at pain management and states that she is unable to see her physician because he is out of town. States that the mid-level provider's decreasing her dose of Dilaudid and it is not controlling her pain. DEA database review shows that this patient was given a prescription for 4 mg Dilaudid 120 tabs were dispensed on 08/08/2015. It appears that she is also taking clonazepam 1 mg . Neurologic exam is nonfocal. She had an MRI on 8:15 that was unremarkable. She had a CT after the fall last week which was also unremarkable. Patient has been to the ED every 48 hours over the last 10 days.  I've been able to get in touch with NPWade-Foster at her pain management clinic, she states that this patient is regularly finishing her prescriptions to early. This patient's physician is not out of town, she's been seen multiple times in the pain management clinic over the last several weeks for exacerbation of her pain however their hands are that they cannot refill the prescription until the 31st. I've made him aware of her frequent ED visits. I will fax him the MRI which she had 2 weeks ago at their request.  This is a shared visit with the attending physician who personally evaluated the patient  and agrees with the care plan.   Evaluation does not show pathology that would require ongoing emergent intervention or inpatient treatment. Pt is hemodynamically stable and mentating appropriately. Discussed findings and plan with patient/guardian, who agrees with care plan. All questions answered. Return precautions discussed and outpatient follow up given.    Final Clinical Impressions(s) / ED Diagnoses   Final diagnoses:  Chronic low back pain    New Prescriptions Discharge Medication List as of 09/05/2015  4:02 PM       Wynetta Emeryicole Luvena Wentling, PA-C 09/05/15 1613    Doug SouSam Jacubowitz, MD 09/05/15 1625

## 2015-09-05 NOTE — ED Triage Notes (Signed)
Pt states she fell in the [facility] emergency room a week and a half ago. Patient says her pain started then but has become worse in the recent days. Pt stated the her lower back hurts and radiates to both legs and buttocks. Pt rates her pain 10/10.

## 2015-09-05 NOTE — ED Notes (Signed)
Per Caitlynn RN, Pt was taken to room in a wheelchair.  RN attempted to assist Pt into bed.  Pt reported that she preferred to stand.  Call light within reach.

## 2015-09-05 NOTE — ED Notes (Signed)
MD at bedside.  EDP J PRESENT 

## 2015-09-05 NOTE — ED Provider Notes (Signed)
Patient with long-standing low back pain for several months. Worse with movement. Walks with cane. She is advised to contact her primary care physician if pain not well controlled or to continue to try to contact her pain management clinic   Doug SouSam Whyatt Klinger, MD 09/05/15 1503

## 2015-09-05 NOTE — ED Notes (Addendum)
Pt has HX of sciatica nerve damage. HX pf pain  greater on left side. Nerve  Numbness to left side down to knee to foot usual however increased since recent fall. Using walker during these "flare ups" not unusual. No loss of bowel or bladder, Neuro intact during assessment. Pt with HX of panic attack. Pt she has a ride home

## 2015-09-22 ENCOUNTER — Emergency Department (HOSPITAL_COMMUNITY)
Admission: EM | Admit: 2015-09-22 | Discharge: 2015-09-22 | Disposition: A | Discharge status: Home / Self Care | Payer: Medicare HMO | Attending: Dermatology | Admitting: Dermatology

## 2015-09-22 ENCOUNTER — Encounter (HOSPITAL_COMMUNITY): Payer: Self-pay | Admitting: Emergency Medicine

## 2015-09-22 DIAGNOSIS — M545 Low back pain: Secondary | ICD-10-CM | POA: Diagnosis not present

## 2015-09-22 DIAGNOSIS — Y929 Unspecified place or not applicable: Secondary | ICD-10-CM | POA: Insufficient documentation

## 2015-09-22 DIAGNOSIS — Z79899 Other long term (current) drug therapy: Secondary | ICD-10-CM | POA: Insufficient documentation

## 2015-09-22 DIAGNOSIS — R1033 Periumbilical pain: Secondary | ICD-10-CM | POA: Diagnosis not present

## 2015-09-22 DIAGNOSIS — Y999 Unspecified external cause status: Secondary | ICD-10-CM | POA: Diagnosis not present

## 2015-09-22 DIAGNOSIS — Y9301 Activity, walking, marching and hiking: Secondary | ICD-10-CM | POA: Insufficient documentation

## 2015-09-22 DIAGNOSIS — W109XXA Fall (on) (from) unspecified stairs and steps, initial encounter: Secondary | ICD-10-CM | POA: Diagnosis not present

## 2015-09-22 DIAGNOSIS — Z5321 Procedure and treatment not carried out due to patient leaving prior to being seen by health care provider: Secondary | ICD-10-CM | POA: Insufficient documentation

## 2015-09-22 NOTE — ED Notes (Signed)
Pt called for v/s recheck x2 with no response

## 2015-09-22 NOTE — ED Notes (Signed)
Pt called 3x with no response 

## 2015-09-22 NOTE — ED Triage Notes (Addendum)
Pt fell onto buttocks last night while walking up the stairs. Fall exacerbated lower back pain with radiation to L leg. Pt has had chronic umbilical pain since hernia surgery. After fall, pain became worse. Pt refuses blood work at this time as it feels similar to chronic pain.

## 2015-09-22 NOTE — ED Notes (Signed)
Pt called for v/s recheck,  no response 

## 2015-09-23 ENCOUNTER — Encounter (HOSPITAL_COMMUNITY): Payer: Self-pay | Admitting: *Deleted

## 2015-09-23 ENCOUNTER — Emergency Department (HOSPITAL_COMMUNITY)
Admission: EM | Admit: 2015-09-23 | Discharge: 2015-09-23 | Disposition: A | Discharge status: Home / Self Care | Payer: Medicare HMO | Attending: Emergency Medicine | Admitting: Emergency Medicine

## 2015-09-23 DIAGNOSIS — M545 Low back pain, unspecified: Secondary | ICD-10-CM

## 2015-09-23 DIAGNOSIS — Z79899 Other long term (current) drug therapy: Secondary | ICD-10-CM | POA: Diagnosis not present

## 2015-09-23 DIAGNOSIS — M25552 Pain in left hip: Secondary | ICD-10-CM | POA: Diagnosis not present

## 2015-09-23 MED ORDER — BUPIVACAINE HCL (PF) 0.5 % IJ SOLN
20.0000 mL | Freq: Once | INTRAMUSCULAR | Status: AC
  Administered 2015-09-23: 20 mL
  Filled 2015-09-23: qty 30

## 2015-09-23 MED ORDER — KETOROLAC TROMETHAMINE 60 MG/2ML IM SOLN
60.0000 mg | Freq: Once | INTRAMUSCULAR | Status: AC
  Administered 2015-09-23: 60 mg via INTRAMUSCULAR
  Filled 2015-09-23: qty 2

## 2015-09-23 NOTE — ED Notes (Signed)
MD at bedside. 

## 2015-09-23 NOTE — ED Provider Notes (Signed)
[facility] Provider Note   CSN: [id number] Arrival date & time: 09/23/15  0913     History   Chief Complaint Chief Complaint  Patient presents with  . Back Pain    Chronic    HPI Judith Hardy is a 45 y.o. female.  The history is provided by the patient.  Back Pain   This is a chronic problem. The current episode started more than 2 days ago. The problem occurs constantly. The problem has been gradually worsening. The pain is associated with falling (from ground level sliding to buttocks on stairs). The pain is present in the gluteal region (left lateral hip). The quality of the pain is described as aching. The pain does not radiate. The pain is severe. The pain is the same all the time. Pertinent negatives include no bowel incontinence and no bladder incontinence. She has tried NSAIDs, walking and analgesics for the symptoms. The treatment provided no relief. Risk factors include obesity (chronic pain disorder).    Past Medical History:  Diagnosis Date  . Anxiety   . Arthritis   . Bursitis   . Chronic lower back pain   . Depression   . Osteoarthritis   . Pneumonia 12/16  . Sciatica     Patient Active Problem List   Diagnosis Date Noted  . Primary osteoarthritis of left hip 03/08/2015  . CAP (community acquired pneumonia) 12/24/2014  . Nausea & vomiting 12/24/2014  . Postoperative abdominal pain 05/27/2014  . HNP (herniated nucleus pulposus), lumbar 08/02/2011    Past Surgical History:  Procedure Laterality Date  . APPENDECTOMY  2011  . BACK SURGERY    . HERNIA REPAIR  4/16   umbilical  . JOINT REPLACEMENT    . LUMBAR LAMINECTOMY/DECOMPRESSION MICRODISCECTOMY  01/10/2011   Procedure: LUMBAR LAMINECTOMY/DECOMPRESSION MICRODISCECTOMY;  Surgeon: Javier Docker;  Location: [facility];  Service: Orthopedics;  Laterality: N/A;  Decompression L4 - L5  (X-Ray)  . LUMBAR LAMINECTOMY/DECOMPRESSION MICRODISCECTOMY  08/02/2011   Procedure: LUMBAR  LAMINECTOMY/DECOMPRESSION MICRODISCECTOMY;  Surgeon: Javier Docker, MD;  Location: [facility];  Service: Orthopedics;  Laterality: N/A;  Re-do Decompression L4-L5  . TOTAL HIP ARTHROPLASTY Left 03/08/2015   ANTERIOR APPROACH  . TOTAL HIP ARTHROPLASTY Left 03/08/2015   Procedure: TOTAL HIP ARTHROPLASTY ANTERIOR APPROACH;  Surgeon: Marcene Corning, MD;  Location: [facility];  Service: Orthopedics;  Laterality: Left;    OB History    No data available       Home Medications    Prior to Admission medications   Medication Sig Start Date End Date Taking? Authorizing Provider  clonazePAM (KLONOPIN) 1 MG tablet Take 1 mg by mouth 2 (two) times daily.   Yes Historical Provider, MD  gabapentin (NEURONTIN) 300 MG capsule Take 600 mg by mouth at bedtime.    Yes Historical Provider, MD  Chilton Si Tea POWD Take 8 oz by mouth daily with breakfast. Pt Drinks a cup of Green Tea every morning   Yes Historical Provider, MD  HYDROmorphone (DILAUDID) 2 MG tablet Take 2 mg by mouth 3 (three) times daily.   Yes Historical Provider, MD  LYSINE PO Take 3 tablets by mouth daily with breakfast.   Yes Historical Provider, MD  traZODone (DESYREL) 150 MG tablet Take 225 mg by mouth at bedtime.  03/04/15  Yes Historical Provider, MD  venlafaxine XR (EFFEXOR-XR) 150 MG 24 hr capsule Take 150 mg by mouth daily with breakfast.  09/21/14  Yes Historical Provider, MD  HYDROmorphone (DILAUDID) 4 MG tablet Take  1 tablet (4 mg total) by mouth every 6 (six) hours as needed for severe pain. Patient not taking: Reported on 09/23/2015 05/20/15   Bethann BerkshireJoseph Zammit, MD  meloxicam (MOBIC) 15 MG tablet Take 1 tablet (15 mg total) by mouth daily. Patient not taking: Reported on 09/23/2015 08/04/15   Jaynie Crumbleatyana Kirichenko, PA-C    Family History Family History  Problem Relation Age of Onset  . Heart failure Mother     Social History Social History  Substance Use Topics  . Smoking status: Never Smoker  . Smokeless tobacco: Never Used     Comment:  tried smoking 3 days 2012  . Alcohol use No     Allergies   Acetaminophen; Other; Tramadol; and Hydrocodone   Review of Systems Review of Systems  Gastrointestinal: Negative for bowel incontinence.  Genitourinary: Negative for bladder incontinence.  Musculoskeletal: Positive for back pain.  All other systems reviewed and are negative.    Physical Exam Updated Vital Signs BP 119/78 (BP Location: Left Arm)   Pulse 82   Temp 98.2 F (36.8 C) (Oral)   Resp 16   Ht 5\' 7"  (1.702 m)   Wt 220 lb (99.8 kg)   LMP 01/15/2008 Comment: pt signed pregnancy waiver 09-01-2015  SpO2 96%   BMI 34.46 kg/m   Physical Exam  Constitutional: She is oriented to person, place, and time. She appears well-developed and well-nourished. No distress.  HENT:  Head: Normocephalic.  Eyes: Conjunctivae are normal.  Neck: Neck supple. No tracheal deviation present.  Cardiovascular: Normal rate and regular rhythm.   Pulmonary/Chest: Effort normal. No respiratory distress.  Abdominal: Soft. She exhibits no distension.  Musculoskeletal:       Left hip: She exhibits tenderness. She exhibits normal strength, no swelling and no deformity.       Lumbar back: She exhibits tenderness. She exhibits no bony tenderness, no deformity and no spasm.       Back:       Legs: Neurological: She is alert and oriented to person, place, and time.  Skin: Skin is warm and dry.  Psychiatric: She has a normal mood and affect.     ED Treatments / Results  Labs (all labs ordered are listed, but only abnormal results are displayed) Labs Reviewed - No data to display  EKG  EKG Interpretation None       Radiology No results found.  Procedures Procedures (including critical care time)  Procedure Note: Trigger Point Injection for Myofascial pain  Performed by Dr. Clydene PughKnott Indication: muscle/myofascial pain Muscle body and tendon sheath of the left lateral hip and left lumbar paraspinal muscle(s) were injected  with 0.5% bupivacaine under sterile technique for release of muscle spasm/pain. Patient tolerated well with immediate improvement of symptoms and no immediate complications following procedure.  CPT Code:   1 or 2 muscle bodies: 20552   Medications Ordered in ED Medications  ketorolac (TORADOL) injection 60 mg (not administered)  bupivacaine (MARCAINE) 0.5 % injection 20 mL (20 mLs Infiltration Given by Other 09/23/15 [id number])     Initial Impression / Assessment and Plan / ED Course  I have reviewed the triage vital signs and the nursing notes.  Pertinent labs & imaging results that were available during my care of the patient were reviewed by me and considered in my medical decision making (see chart for details).  Clinical Course    45 year old female presents with acute on chronic left lateral hip and low back pain. She is seen by pain management  and has multiple visits here with ED care management plan. Her pain appears to have worsened since she slid onto her bottom and began stiffening up while going up stairs. I explained that we would not be using narcotic agents per management plan because of numerous visits for pain exacerbations and lack of efficacy with potential for abuse.   She states she has requested direct injection for pain previously and was told by other providers here that this could not be done but was provided narcotic medication. She explained that she would prefer direction injection to narcotics in the future as well. Trigger point injection was applied to help and Toradol was given for initiation of anti-inflammatory therapy. Patient was recommended to take short course of scheduled NSAIDs and engage in early mobility as definitive treatment. I recommended primary care follow-up and further discussions with pain management to monitor her medication intake.  Final Clinical Impressions(s) / ED Diagnoses   Final diagnoses:  Left hip pain  Left-sided low back pain  without sciatica    New Prescriptions New Prescriptions   No medications on file     Lyndal Pulley, MD 09/23/15 1030

## 2015-09-23 NOTE — ED Triage Notes (Signed)
Pt states she stumbled down on buttocks Wed night, has chronic back pain, come in yesterday but did not wait to see EDP, pt has CARE PLAN.

## 2015-09-23 NOTE — ED Notes (Signed)
Went to discharge patient. Pt wants to speak with another doctor as she wants more pain medication. Explained pt is up for discharge and has been examined by a physician prior to discharge. Pt asked to speak with charge RN.

## 2015-10-12 ENCOUNTER — Encounter (HOSPITAL_COMMUNITY): Payer: Self-pay

## 2015-10-12 ENCOUNTER — Emergency Department (HOSPITAL_COMMUNITY)
Admission: EM | Admit: 2015-10-12 | Discharge: 2015-10-12 | Disposition: A | Discharge status: Home / Self Care | Payer: Medicare HMO | Attending: Emergency Medicine | Admitting: Emergency Medicine

## 2015-10-12 DIAGNOSIS — M545 Low back pain, unspecified: Secondary | ICD-10-CM

## 2015-10-12 DIAGNOSIS — Z96642 Presence of left artificial hip joint: Secondary | ICD-10-CM | POA: Diagnosis not present

## 2015-10-12 DIAGNOSIS — N390 Urinary tract infection, site not specified: Secondary | ICD-10-CM | POA: Diagnosis not present

## 2015-10-12 DIAGNOSIS — R319 Hematuria, unspecified: Secondary | ICD-10-CM | POA: Diagnosis present

## 2015-10-12 LAB — CBC WITH DIFFERENTIAL/PLATELET
BASOS PCT: 0 %
Basophils Absolute: 0 10*3/uL (ref 0.0–0.1)
EOS ABS: 0.1 10*3/uL (ref 0.0–0.7)
EOS PCT: 0 %
HCT: 39.5 % (ref 36.0–46.0)
HEMOGLOBIN: 12.8 g/dL (ref 12.0–15.0)
Lymphocytes Relative: 22 %
Lymphs Abs: 3.1 10*3/uL (ref 0.7–4.0)
MCH: 31.9 pg (ref 26.0–34.0)
MCHC: 32.4 g/dL (ref 30.0–36.0)
MCV: 98.5 fL (ref 78.0–100.0)
Monocytes Absolute: 0.6 10*3/uL (ref 0.1–1.0)
Monocytes Relative: 4 %
NEUTROS PCT: 74 %
Neutro Abs: 10.2 10*3/uL — ABNORMAL HIGH (ref 1.7–7.7)
PLATELETS: 356 10*3/uL (ref 150–400)
RBC: 4.01 MIL/uL (ref 3.87–5.11)
RDW: 13.6 % (ref 11.5–15.5)
WBC: 14 10*3/uL — AB (ref 4.0–10.5)

## 2015-10-12 LAB — COMPREHENSIVE METABOLIC PANEL
ALK PHOS: 104 U/L (ref 38–126)
ALT: 16 U/L (ref 14–54)
ANION GAP: 8 (ref 5–15)
AST: 15 U/L (ref 15–41)
Albumin: 4.2 g/dL (ref 3.5–5.0)
BILIRUBIN TOTAL: 0.5 mg/dL (ref 0.3–1.2)
BUN: 20 mg/dL (ref 6–20)
CALCIUM: 9.2 mg/dL (ref 8.9–10.3)
CO2: 25 mmol/L (ref 22–32)
CREATININE: 0.99 mg/dL (ref 0.44–1.00)
Chloride: 103 mmol/L (ref 101–111)
Glucose, Bld: 115 mg/dL — ABNORMAL HIGH (ref 65–99)
Potassium: 3.9 mmol/L (ref 3.5–5.1)
Sodium: 136 mmol/L (ref 135–145)
TOTAL PROTEIN: 8.3 g/dL — AB (ref 6.5–8.1)

## 2015-10-12 LAB — URINALYSIS, ROUTINE W REFLEX MICROSCOPIC
Bilirubin Urine: NEGATIVE
Glucose, UA: NEGATIVE mg/dL
Ketones, ur: NEGATIVE mg/dL
Nitrite: NEGATIVE
PROTEIN: NEGATIVE mg/dL
Specific Gravity, Urine: 1.021 (ref 1.005–1.030)
pH: 7.5 (ref 5.0–8.0)

## 2015-10-12 LAB — URINE MICROSCOPIC-ADD ON

## 2015-10-12 LAB — LIPASE, BLOOD: Lipase: 19 U/L (ref 11–51)

## 2015-10-12 MED ORDER — SODIUM CHLORIDE 0.9 % IV BOLUS (SEPSIS)
1000.0000 mL | Freq: Once | INTRAVENOUS | Status: AC
  Administered 2015-10-12: 1000 mL via INTRAVENOUS

## 2015-10-12 MED ORDER — CEFTRIAXONE SODIUM 1 G IJ SOLR
1.0000 g | Freq: Once | INTRAMUSCULAR | Status: AC
  Administered 2015-10-12: 1 g via INTRAVENOUS
  Filled 2015-10-12: qty 10

## 2015-10-12 MED ORDER — KETOROLAC TROMETHAMINE 30 MG/ML IJ SOLN
30.0000 mg | Freq: Once | INTRAMUSCULAR | Status: AC
  Administered 2015-10-12: 30 mg via INTRAVENOUS
  Filled 2015-10-12: qty 1

## 2015-10-12 MED ORDER — SULFAMETHOXAZOLE-TRIMETHOPRIM 800-160 MG PO TABS: 1.0000 | ORAL_TABLET | Freq: Two times a day (BID) | ORAL | 0 refills | Status: AC

## 2015-10-12 NOTE — ED Triage Notes (Signed)
Pt has had back pain x 2 weeks.  Pt states she now has blood in her urine.

## 2015-10-12 NOTE — ED Provider Notes (Signed)
Emergency Department Provider Note   I have reviewed the triage vital signs and the nursing notes.   HISTORY  Chief Complaint Back Pain and Hematuria   HPI Judith Hardy is a 45 y.o. female with PMH of anxiety, chronic low back pain, and OA presents to the emergency department for evaluation of 6 weeks of lower back pain now with associated fever, chills, and hematuria for the last week. Patient states her back pain has progressed and feels different than her chronic lower back pain. She has pain higher up on the left which seems atypical. She had subjective fevers earlier in the week along with some hematuria. She reports some mild anterior lower abdominal pain. No chest pain or difficulty breathing. She's been taking Azo over-the-counter with some relief and hematuria but continues to have back pain.  Past Medical History:  Diagnosis Date  . Anxiety   . Arthritis   . Bursitis   . Chronic lower back pain   . Depression   . Osteoarthritis   . Pneumonia 12/16  . Sciatica     Patient Active Problem List   Diagnosis Date Noted  . Primary osteoarthritis of left hip 03/08/2015  . CAP (community acquired pneumonia) 12/24/2014  . Nausea & vomiting 12/24/2014  . Postoperative abdominal pain 05/27/2014  . HNP (herniated nucleus pulposus), lumbar 08/02/2011    Past Surgical History:  Procedure Laterality Date  . APPENDECTOMY  2011  . BACK SURGERY    . HERNIA REPAIR  4/16   umbilical  . JOINT REPLACEMENT    . LUMBAR LAMINECTOMY/DECOMPRESSION MICRODISCECTOMY  01/10/2011   Procedure: LUMBAR LAMINECTOMY/DECOMPRESSION MICRODISCECTOMY;  Surgeon: Javier Docker;  Location: [facility];  Service: Orthopedics;  Laterality: N/A;  Decompression L4 - L5  (X-Ray)  . LUMBAR LAMINECTOMY/DECOMPRESSION MICRODISCECTOMY  08/02/2011   Procedure: LUMBAR LAMINECTOMY/DECOMPRESSION MICRODISCECTOMY;  Surgeon: Javier Docker, MD;  Location: [facility];  Service: Orthopedics;  Laterality: N/A;  Re-do  Decompression L4-L5  . TOTAL HIP ARTHROPLASTY Left 03/08/2015   ANTERIOR APPROACH  . TOTAL HIP ARTHROPLASTY Left 03/08/2015   Procedure: TOTAL HIP ARTHROPLASTY ANTERIOR APPROACH;  Surgeon: Marcene Corning, MD;  Location: [facility];  Service: Orthopedics;  Laterality: Left;    Current Outpatient Rx  . Order #: [id number] Class: Historical Med  . Order #: [id number] Class: Historical Med  . Order #: [id number] Class: Historical Med  . Order #: [id number] Class: Print  . Order #: [id number] Class: Historical Med  . Order #: [id number] Class: Historical Med  . Order #: [id number] Class: Historical Med  . Order #: [id number] Class: Historical Med  . Order #: [id number] Class: Print  . Order #: [id number] Class: Print    Allergies Acetaminophen; Other; Tramadol; and Hydrocodone  Family History  Problem Relation Age of Onset  . Heart failure Mother     Social History Social History  Substance Use Topics  . Smoking status: Never Smoker  . Smokeless tobacco: Never Used     Comment: tried smoking 3 days 2012  . Alcohol use No    Review of Systems  Constitutional: No fever/chills Eyes: No visual changes. ENT: No sore throat. Cardiovascular: Denies chest pain. Respiratory: Denies shortness of breath. Gastrointestinal: Positive lower abdominal pain.  No nausea, no vomiting.  No diarrhea.  No constipation. Genitourinary: Negative for dysuria. Positive back pain. Positive hematuria.  Musculoskeletal: Negative for back pain. Skin: Negative for rash. Neurological: Negative for headaches, focal weakness or numbness.  10-point ROS otherwise negative.  ____________________________________________   PHYSICAL EXAM:  VITAL SIGNS: ED Triage Vitals  Enc Vitals Group     BP 10/12/15 1139 160/100     Pulse Rate 10/12/15 1139 101     Resp 10/12/15 1139 18     Temp 10/12/15 1139 98.1 F (36.7 C)     Temp Source 10/12/15 1139 Oral     SpO2 10/12/15 1139 100 %     Pain Score 10/12/15 1146 10    Constitutional: Alert and oriented. Well appearing and in no acute distress. Eyes: Conjunctivae are normal.  Head: Atraumatic. Nose: No congestion/rhinnorhea. Mouth/Throat: Mucous membranes are moist.  Oropharynx non-erythematous. Neck: No stridor.   Cardiovascular: Tachycardia. Good peripheral circulation. Grossly normal heart sounds.   Respiratory: Normal respiratory effort.  No retractions. Lungs CTAB. Gastrointestinal: Soft and nontender. No distention. Positive left CVA tenderness.  Musculoskeletal: No lower extremity tenderness nor edema. No gross deformities of extremities. Neurologic:  Normal speech and language. No gross focal neurologic deficits are appreciated.  Skin:  Skin is warm, dry and intact. No rash noted. Psychiatric: Mood and affect are normal. Speech and behavior are normal.  ____________________________________________   LABS (all labs ordered are listed, but only abnormal results are displayed)  Labs Reviewed  URINALYSIS, ROUTINE W REFLEX MICROSCOPIC (NOT AT [facility]) - Abnormal; Notable for the following:       Result Value   APPearance CLOUDY (*)    Hgb urine dipstick SMALL (*)    Leukocytes, UA LARGE (*)    All other components within normal limits  URINE MICROSCOPIC-ADD ON - Abnormal; Notable for the following:    Squamous Epithelial / LPF 6-30 (*)    Bacteria, UA FEW (*)    All other components within normal limits  COMPREHENSIVE METABOLIC PANEL - Abnormal; Notable for the following:    Glucose, Bld 115 (*)    Total Protein 8.3 (*)    All other components within normal limits  CBC WITH DIFFERENTIAL/PLATELET - Abnormal; Notable for the following:    WBC 14.0 (*)    Neutro Abs 10.2 (*)    All other components within normal limits  LIPASE, BLOOD   ____________________________________________  RADIOLOGY  None ____________________________________________   PROCEDURES  Procedure(s) performed:    Procedures  None ____________________________________________   INITIAL IMPRESSION / ASSESSMENT AND PLAN / ED COURSE  Pertinent labs & imaging results that were available during my care of the patient were reviewed by me and considered in my medical decision making (see chart for details).  Patient presents to the emergency department for evaluation of acute on chronic lower back pain and hematuria. She is also subjective fever and chills at home over the last week. Patient has evidence of urine infection on UA. Clinically she has left CVA tenderness to percussion. No fever here but mild tachycardia. No neurological deficits on exam to suspect spine or CNS pathology. Plan for IV Rocephin along with baseline labs. Will give Toradol for lower back pain. Clinically suspect mild pyelonephritis. Patient's anterior abdomen is soft and nontender to palpation.  Updated the patient prior to D/C. Denied request for stronger home pain medication. Discussed that treating UTI is my priority. She reports taking Dilaudid at home already. Discussed return precautions for worsening UTI.   At this time, I do not feel there is any life-threatening condition present. I have reviewed and discussed all results (EKG, imaging, lab, urine as appropriate), exam findings with patient. I have reviewed nursing notes and appropriate previous records.  I feel the patient is safe to be discharged home without further emergent workup.  Discussed usual and customary return precautions. Patient and family (if present) verbalize understanding and are comfortable with this plan.  Patient will follow-up with their primary care provider. If they do not have a primary care provider, information for follow-up has been provided to them. All questions have been answered.  ____________________________________________  FINAL CLINICAL IMPRESSION(S) / ED DIAGNOSES  Final diagnoses:  Urinary tract infection without hematuria, site  unspecified  Bilateral low back pain without sciatica, unspecified chronicity     MEDICATIONS GIVEN DURING THIS VISIT:  Medications  sodium chloride 0.9 % bolus 1,000 mL (0 mLs Intravenous Stopped 10/12/15 1613)  cefTRIAXone (ROCEPHIN) 1 g in dextrose 5 % 50 mL IVPB (0 g Intravenous Stopped 10/12/15 1502)  ketorolac (TORADOL) 30 MG/ML injection 30 mg (30 mg Intravenous Given 10/12/15 1432)     NEW OUTPATIENT MEDICATIONS STARTED DURING THIS VISIT:  Discharge Medication List as of 10/12/2015  4:05 PM    START taking these medications   Details  sulfamethoxazole-trimethoprim (BACTRIM DS,SEPTRA DS) 800-160 MG tablet Take 1 tablet by mouth 2 (two) times daily., Starting Wed 10/12/2015, Until Wed 10/19/2015, Print          Note:  This document was prepared using Dragon voice recognition software and may include unintentional dictation errors.  Alona BeneJoshua Maddilyn Campus, MD Emergency Medicine   Maia PlanJoshua G Kyi Romanello, MD 10/12/15 [phone]

## 2015-10-12 NOTE — Discharge Instructions (Signed)

## 2015-10-14 ENCOUNTER — Encounter (HOSPITAL_COMMUNITY): Payer: Self-pay

## 2015-10-14 ENCOUNTER — Emergency Department (HOSPITAL_COMMUNITY)
Admission: EM | Admit: 2015-10-14 | Discharge: 2015-10-14 | Disposition: A | Discharge status: Home / Self Care | Payer: Medicare HMO | Attending: Emergency Medicine | Admitting: Emergency Medicine

## 2015-10-14 DIAGNOSIS — Z96642 Presence of left artificial hip joint: Secondary | ICD-10-CM | POA: Diagnosis not present

## 2015-10-14 DIAGNOSIS — R1030 Lower abdominal pain, unspecified: Secondary | ICD-10-CM

## 2015-10-14 DIAGNOSIS — R109 Unspecified abdominal pain: Secondary | ICD-10-CM

## 2015-10-14 DIAGNOSIS — R300 Dysuria: Secondary | ICD-10-CM | POA: Diagnosis present

## 2015-10-14 LAB — COMPREHENSIVE METABOLIC PANEL
ALT: 19 U/L (ref 14–54)
ANION GAP: 8 (ref 5–15)
AST: 17 U/L (ref 15–41)
Albumin: 4.1 g/dL (ref 3.5–5.0)
Alkaline Phosphatase: 93 U/L (ref 38–126)
BILIRUBIN TOTAL: 0.6 mg/dL (ref 0.3–1.2)
BUN: 13 mg/dL (ref 6–20)
CHLORIDE: 105 mmol/L (ref 101–111)
CO2: 24 mmol/L (ref 22–32)
Calcium: 9.2 mg/dL (ref 8.9–10.3)
Creatinine, Ser: 0.96 mg/dL (ref 0.44–1.00)
Glucose, Bld: 94 mg/dL (ref 65–99)
POTASSIUM: 4.1 mmol/L (ref 3.5–5.1)
Sodium: 137 mmol/L (ref 135–145)
TOTAL PROTEIN: 8.1 g/dL (ref 6.5–8.1)

## 2015-10-14 LAB — CBC
HEMATOCRIT: 39.9 % (ref 36.0–46.0)
HEMOGLOBIN: 13.2 g/dL (ref 12.0–15.0)
MCH: 32.4 pg (ref 26.0–34.0)
MCHC: 33.1 g/dL (ref 30.0–36.0)
MCV: 97.8 fL (ref 78.0–100.0)
Platelets: 346 10*3/uL (ref 150–400)
RBC: 4.08 MIL/uL (ref 3.87–5.11)
RDW: 13.4 % (ref 11.5–15.5)
WBC: 14 10*3/uL — AB (ref 4.0–10.5)

## 2015-10-14 LAB — URINALYSIS, ROUTINE W REFLEX MICROSCOPIC
Bilirubin Urine: NEGATIVE
GLUCOSE, UA: NEGATIVE mg/dL
Hgb urine dipstick: NEGATIVE
KETONES UR: NEGATIVE mg/dL
LEUKOCYTES UA: NEGATIVE
NITRITE: NEGATIVE
PH: 6.5 (ref 5.0–8.0)
Protein, ur: NEGATIVE mg/dL
SPECIFIC GRAVITY, URINE: 1.018 (ref 1.005–1.030)

## 2015-10-14 LAB — LIPASE, BLOOD: LIPASE: 19 U/L (ref 11–51)

## 2015-10-14 LAB — HCG, QUANTITATIVE, PREGNANCY: hCG, Beta Chain, Quant, S: 1 m[IU]/mL (ref <5)

## 2015-10-14 MED ORDER — CEPHALEXIN 500 MG PO CAPS
500.0000 mg | ORAL_CAPSULE | Freq: Once | ORAL | Status: AC
  Administered 2015-10-14: 500 mg via ORAL
  Filled 2015-10-14: qty 1

## 2015-10-14 MED ORDER — ACETAMINOPHEN 500 MG PO TABS
1000.0000 mg | ORAL_TABLET | Freq: Once | ORAL | Status: DC
  Filled 2015-10-14: qty 2

## 2015-10-14 MED ORDER — CEPHALEXIN 250 MG PO CAPS: 250.0000 mg | ORAL_CAPSULE | Freq: Four times a day (QID) | ORAL | 0 refills | Status: DC

## 2015-10-14 NOTE — ED Triage Notes (Signed)
Pt called from lobby x1 No response 

## 2015-10-14 NOTE — ED Triage Notes (Signed)
PT C/O BILATERAL FLANK PAIN RADIATING TO THE LOWER ABDOMEN X 2 DAYS. PT STS SHE WAS SEEN HERE ON WED AND DX'D W/A KIDNEY INFECTION. PT STS SHE HAS BEEN TAKING THE PRESCRIBED MEDS, BUT SHE IS GETTING WORSE W/ LESS URINE OUTPUT. DENIES FEVER, BUT STS N/V/D.

## 2015-10-14 NOTE — ED Provider Notes (Signed)
[facility]-EMERGENCY DEPT Provider Note   CSN: [id number] Arrival date & time: 10/14/15  1216     History   Chief Complaint Chief Complaint  Patient presents with  . Abdominal Pain  . Dysuria    HPI Judith Hardy is a 44 y.o. female.  Patient with history of chronic back pain presents to the ED with a chief complaint of persistent dysuria, frequency, urgency and flank pain.  She states that she was seen 2 days ago and treated for early pyelonephritis with bactrim.  She states that since taking the bactrim she has felt worse.  She reports persistent symptoms.  She also reports some associated nausea and vomiting.  She denies any other associated symptoms.  She has not tried taking any tylenol or ibuprofen at home because she was afraid of injuring her kidneys.   The history is provided by the patient. No language interpreter was used.    Past Medical History:  Diagnosis Date  . Anxiety   . Arthritis   . Bursitis   . Chronic lower back pain   . Depression   . Osteoarthritis   . Pneumonia 12/16  . Sciatica     Patient Active Problem List   Diagnosis Date Noted  . Primary osteoarthritis of left hip 03/08/2015  . CAP (community acquired pneumonia) 12/24/2014  . Nausea & vomiting 12/24/2014  . Postoperative abdominal pain 05/27/2014  . HNP (herniated nucleus pulposus), lumbar 08/02/2011    Past Surgical History:  Procedure Laterality Date  . APPENDECTOMY  2011  . BACK SURGERY    . HERNIA REPAIR  4/16   umbilical  . JOINT REPLACEMENT    . LUMBAR LAMINECTOMY/DECOMPRESSION MICRODISCECTOMY  01/10/2011   Procedure: LUMBAR LAMINECTOMY/DECOMPRESSION MICRODISCECTOMY;  Surgeon: Javier Docker;  Location: [facility];  Service: Orthopedics;  Laterality: N/A;  Decompression L4 - L5  (X-Ray)  . LUMBAR LAMINECTOMY/DECOMPRESSION MICRODISCECTOMY  08/02/2011   Procedure: LUMBAR LAMINECTOMY/DECOMPRESSION MICRODISCECTOMY;  Surgeon: Javier Docker, MD;  Location: [facility];  Service: Orthopedics;   Laterality: N/A;  Re-do Decompression L4-L5  . TOTAL HIP ARTHROPLASTY Left 03/08/2015   ANTERIOR APPROACH  . TOTAL HIP ARTHROPLASTY Left 03/08/2015   Procedure: TOTAL HIP ARTHROPLASTY ANTERIOR APPROACH;  Surgeon: Marcene Corning, MD;  Location: [facility];  Service: Orthopedics;  Laterality: Left;    OB History    No data available       Home Medications    Prior to Admission medications   Medication Sig Start Date End Date Taking? Authorizing Provider  clonazePAM (KLONOPIN) 1 MG tablet Take 1 mg by mouth 3 (three) times daily as needed for anxiety.    Yes Historical Provider, MD  diclofenac (VOLTAREN) 75 MG EC tablet Take 75 mg by mouth 2 (two) times daily. 10/08/15  Yes Historical Provider, MD  gabapentin (NEURONTIN) 300 MG capsule Take 600 mg by mouth at bedtime.    Yes Historical Provider, MD  HYDROmorphone (DILAUDID) 4 MG tablet Take 1 tablet (4 mg total) by mouth every 6 (six) hours as needed for severe pain. 05/20/15  Yes [name], MD  naproxen sodium (ANAPROX) 220 MG tablet Take 220 mg by mouth 2 (two) times daily as needed (pain).   Yes Historical Provider, MD  ondansetron (ZOFRAN-ODT) 4 MG disintegrating tablet Take 4 mg by mouth daily as needed for nausea or vomiting.  08/17/15  Yes Historical Provider, MD  sulfamethoxazole-trimethoprim (BACTRIM DS,SEPTRA DS) 800-160 MG tablet Take 1 tablet by mouth 2 (two) times daily. 10/12/15 10/19/15 Yes Arlyss Repress  Long, MD  traZODone (DESYREL) 150 MG tablet Take 225 mg by mouth at bedtime.  03/04/15  Yes Historical Provider, MD  venlafaxine XR (EFFEXOR-XR) 150 MG 24 hr capsule Take 150 mg by mouth daily with breakfast.  09/21/14  Yes Historical Provider, MD  cephALEXin (KEFLEX) 250 MG capsule Take 1 capsule (250 mg total) by mouth 4 (four) times daily. 10/14/15   Roxy Horsemanobert Dhriti Fales, PA-C  meloxicam (MOBIC) 15 MG tablet Take 1 tablet (15 mg total) by mouth daily. Patient not taking: Reported on 10/14/2015 08/04/15   Jaynie Crumbleatyana Kirichenko, PA-C    Family  History Family History  Problem Relation Age of Onset  . Heart failure Mother     Social History Social History  Substance Use Topics  . Smoking status: Never Smoker  . Smokeless tobacco: Never Used     Comment: tried smoking 3 days 2012  . Alcohol use No     Allergies   Acetaminophen; Other; Tramadol; and Hydrocodone   Review of Systems Review of Systems  Genitourinary: Positive for dysuria and flank pain.  All other systems reviewed and are negative.    Physical Exam Updated Vital Signs BP (!) 129/117   Pulse (!) 57   Temp 98.7 F (37.1 C) (Oral)   Resp 17   Ht 5\' 7"  (1.702 m)   Wt 97.5 kg   SpO2 98%   BMI 33.67 kg/m   Physical Exam  Constitutional: She is oriented to person, place, and time. She appears well-developed and well-nourished.  HENT:  Head: Normocephalic and atraumatic.  Eyes: Conjunctivae and EOM are normal. Pupils are equal, round, and reactive to light.  Neck: Normal range of motion. Neck supple.  Cardiovascular: Normal rate and regular rhythm.  Exam reveals no gallop and no friction rub.   No murmur heard. Pulmonary/Chest: Effort normal and breath sounds normal. No respiratory distress. She has no wheezes. She has no rales. She exhibits no tenderness.  Abdominal: Soft. Bowel sounds are normal. She exhibits no distension and no mass. There is no tenderness. There is no rebound and no guarding.  Abdomen is generally uncomfortable, but no focal abdominal tenderness, no RLQ tenderness or pain at McBurney's point, no RUQ tenderness or Murphy's sign, no left-sided abdominal tenderness, no fluid wave, or signs of peritonitis   Musculoskeletal: Normal range of motion. She exhibits no edema or tenderness.  Neurological: She is alert and oriented to person, place, and time.  Skin: Skin is warm and dry.  Psychiatric: She has a normal mood and affect. Her behavior is normal. Judgment and thought content normal.  Nursing note and vitals reviewed.    ED  Treatments / Results  Labs (all labs ordered are listed, but only abnormal results are displayed) Labs Reviewed  CBC - Abnormal; Notable for the following:       Result Value   WBC 14.0 (*)    All other components within normal limits  URINE CULTURE  LIPASE, BLOOD  COMPREHENSIVE METABOLIC PANEL  URINALYSIS, ROUTINE W REFLEX MICROSCOPIC (NOT AT [facility])  HCG, QUANTITATIVE, PREGNANCY    EKG  EKG Interpretation None       Radiology No results found.  Procedures Procedures (including critical care time)  Medications Ordered in ED Medications  acetaminophen (TYLENOL) tablet 1,000 mg (not administered)  cephALEXin (KEFLEX) capsule 500 mg (not administered)     Initial Impression / Assessment and Plan / ED Course  I have reviewed the triage vital signs and the nursing notes.  Pertinent labs &  imaging results that were available during my care of the patient were reviewed by me and considered in my medical decision making (see chart for details).  Clinical Course  Value Comment By Time  Leukocytes, UA: NEGATIVE (Reviewed) Roxy Horseman, PA-C 10/06 1525    Patient with persistent flank pain, dysuria.  Reviewed prior urine cultures.  Better sensitivity to cephalosporins, will switch from bactrim to keflex x 14 days. Labs actually look improved. I have a high degree of suspicion for drug seeking behavior.  Patient initially accepted my plan for treatment with 1000 mg of Tylenol, then when the nurse went to administer she states that she wants something stronger.  She then stated that she can't take tylenol, despite telling me that she agreed to take it an the new antibiotic.  I do not feel that stronger pain medication is indicated at this time.  I do not think it would be beneficial to the patient's overall health and well-being.    Final Clinical Impressions(s) / ED Diagnoses   Final diagnoses:  Dysuria  Flank pain  Lower abdominal pain    New Prescriptions New  Prescriptions   CEPHALEXIN (KEFLEX) 250 MG CAPSULE    Take 1 capsule (250 mg total) by mouth 4 (four) times daily.     Roxy Horseman, PA-C 10/14/15 1526    Laurence Spates, MD 10/16/15 [phone]

## 2015-10-15 LAB — URINE CULTURE: CULTURE: NO GROWTH

## 2016-02-01 ENCOUNTER — Encounter (HOSPITAL_COMMUNITY): Payer: Self-pay | Admitting: Emergency Medicine

## 2016-02-01 ENCOUNTER — Emergency Department (HOSPITAL_COMMUNITY)
Admission: EM | Admit: 2016-02-01 | Discharge: 2016-02-01 | Disposition: A | Discharge status: Home / Self Care | Payer: Medicare HMO | Attending: Emergency Medicine | Admitting: Emergency Medicine

## 2016-02-01 ENCOUNTER — Emergency Department (HOSPITAL_COMMUNITY): Payer: Medicare HMO

## 2016-02-01 DIAGNOSIS — R05 Cough: Secondary | ICD-10-CM | POA: Diagnosis present

## 2016-02-01 DIAGNOSIS — Z96642 Presence of left artificial hip joint: Secondary | ICD-10-CM | POA: Insufficient documentation

## 2016-02-01 DIAGNOSIS — J40 Bronchitis, not specified as acute or chronic: Secondary | ICD-10-CM | POA: Diagnosis not present

## 2016-02-01 MED ORDER — AZITHROMYCIN 250 MG PO TABS: 250.0000 mg | ORAL_TABLET | Freq: Every day | ORAL | 0 refills | Status: DC

## 2016-02-01 MED ORDER — ALBUTEROL SULFATE HFA 108 (90 BASE) MCG/ACT IN AERS
2.0000 | INHALATION_SPRAY | Freq: Once | RESPIRATORY_TRACT | Status: AC
  Administered 2016-02-01: 2 via RESPIRATORY_TRACT
  Filled 2016-02-01: qty 6.7

## 2016-02-01 MED ORDER — KETOROLAC TROMETHAMINE 60 MG/2ML IM SOLN
60.0000 mg | Freq: Once | INTRAMUSCULAR | Status: AC
  Administered 2016-02-01: 60 mg via INTRAMUSCULAR
  Filled 2016-02-01: qty 2

## 2016-02-01 MED ORDER — BENZONATATE 100 MG PO CAPS: 100.0000 mg | ORAL_CAPSULE | Freq: Two times a day (BID) | ORAL | 0 refills | Status: DC | PRN

## 2016-02-01 MED ORDER — GUAIFENESIN-DM 100-10 MG/5ML PO SYRP: 5.0000 mL | ORAL_SOLUTION | ORAL | 0 refills | Status: DC | PRN

## 2016-02-01 NOTE — ED Notes (Signed)
Bed: WTR7 Expected date:  Expected time:  Means of arrival:  Comments: 

## 2016-02-01 NOTE — ED Triage Notes (Signed)
Per pt, states body aches, congestion and fever for 3 days-has had flu shot

## 2016-02-01 NOTE — Discharge Instructions (Signed)
Read the information below.  Use the prescribed medication as directed.  Please discuss all new medications with your pharmacist.  You may return to the Emergency Department at any time for worsening condition or any new symptoms that concern you.   If you develop worsening shortness of breath, uncontrolled wheezing, severe chest pain, or fevers despite using tylenol and/or ibuprofen, return for a recheck.     °

## 2016-02-01 NOTE — ED Provider Notes (Signed)
[facility]-EMERGENCY DEPT Provider Note   CSN: [id number] Arrival date & time: 02/01/16  1016   By signing my name below, I, [name], attest that this documentation has been prepared under the direction and in the presence of [name], [place]. Electronically Signed: [name], ED Scribe. 02/01/2016. 11:08 AM.   History   Chief Complaint Chief Complaint  Patient presents with  . flu like symptoms   The history is provided by the patient. No language interpreter was used.  HPI Comments:  Judith Hardy is a 46 y.o. female with PMHx of chronic back pain who presents to the Emergency Department complaining of multiple, constant URI symptoms x 14 days. Pt complains of associated SOB, productive cough, fatigue, dizziness, decreased appetite, rhinorrhea, and abdominal pain. Pt states that 3 weeks ago she had a fever and congestion for 3 days but then these resolved, and then 2 weeks later she began and having her current symptoms which have persisted. Pt notes an known allergy to Tylenol, hydrocodone, tramadol and steroid. She notes that steroids cause damage to her joints and abdominal pain because she has taken numerous steroids for her chronic back pain. Pt has taken Mucinex with no relief. Pt denies any current sore throat, congestion, ear pain, fever, chills, generalized body aches.   Past Medical History:  Diagnosis Date  . Anxiety   . Arthritis   . Bursitis   . Chronic lower back pain   . Depression   . Osteoarthritis   . Pneumonia 12/16  . Sciatica     Patient Active Problem List   Diagnosis Date Noted  . Primary osteoarthritis of left hip 03/08/2015  . CAP (community acquired pneumonia) 12/24/2014  . Nausea & vomiting 12/24/2014  . Postoperative abdominal pain 05/27/2014  . HNP (herniated nucleus pulposus), lumbar 08/02/2011    Past Surgical History:  Procedure Laterality Date  . APPENDECTOMY  2011  . BACK SURGERY    . HERNIA REPAIR  4/16   umbilical  .  JOINT REPLACEMENT    . LUMBAR LAMINECTOMY/DECOMPRESSION MICRODISCECTOMY  01/10/2011   Procedure: LUMBAR LAMINECTOMY/DECOMPRESSION MICRODISCECTOMY;  Surgeon: Javier Docker;  Location: [facility];  Service: Orthopedics;  Laterality: N/A;  Decompression L4 - L5  (X-Ray)  . LUMBAR LAMINECTOMY/DECOMPRESSION MICRODISCECTOMY  08/02/2011   Procedure: LUMBAR LAMINECTOMY/DECOMPRESSION MICRODISCECTOMY;  Surgeon: Javier Docker, MD;  Location: [facility];  Service: Orthopedics;  Laterality: N/A;  Re-do Decompression L4-L5  . TOTAL HIP ARTHROPLASTY Left 03/08/2015   ANTERIOR APPROACH  . TOTAL HIP ARTHROPLASTY Left 03/08/2015   Procedure: TOTAL HIP ARTHROPLASTY ANTERIOR APPROACH;  Surgeon: Marcene Corning, MD;  Location: [facility];  Service: Orthopedics;  Laterality: Left;    OB History    No data available       Home Medications    Prior to Admission medications   Medication Sig Start Date End Date Taking? Authorizing Provider  azithromycin (ZITHROMAX) 250 MG tablet Take 1 tablet (250 mg total) by mouth daily. Take first 2 tablets together, then 1 every day until finished. 02/01/16   [name], PA-C  benzonatate (TESSALON) 100 MG capsule Take 1 capsule (100 mg total) by mouth 2 (two) times daily as needed for cough. 02/01/16   [name], PA-C  clonazePAM (KLONOPIN) 1 MG tablet Take 1 mg by mouth 3 (three) times daily as needed for anxiety.     Historical Provider, MD  diclofenac (VOLTAREN) 75 MG EC tablet Take 75 mg by mouth 2 (two) times daily. 10/08/15  Historical Provider, MD  gabapentin (NEURONTIN) 300 MG capsule Take 600 mg by mouth at bedtime.     Historical Provider, MD  guaiFENesin-dextromethorphan (ROBITUSSIN DM) 100-10 MG/5ML syrup Take 5 mLs by mouth every 4 (four) hours as needed for cough (and congestion). 02/01/16   [name], PA-C  HYDROmorphone (DILAUDID) 4 MG tablet Take 1 tablet (4 mg total) by mouth every 6 (six) hours as needed for severe pain. 05/20/15   [name], MD  meloxicam (MOBIC) 15 MG  tablet Take 1 tablet (15 mg total) by mouth daily. Patient not taking: Reported on 10/14/2015 08/04/15   [name], PA-C  naproxen sodium (ANAPROX) 220 MG tablet Take 220 mg by mouth 2 (two) times daily as needed (pain).    Historical Provider, MD  ondansetron (ZOFRAN-ODT) 4 MG disintegrating tablet Take 4 mg by mouth daily as needed for nausea or vomiting.  08/17/15   Historical Provider, MD  traZODone (DESYREL) 150 MG tablet Take 225 mg by mouth at bedtime.  03/04/15   Historical Provider, MD  venlafaxine XR (EFFEXOR-XR) 150 MG 24 hr capsule Take 150 mg by mouth daily with breakfast.  09/21/14   Historical Provider, MD    Family History Family History  Problem Relation Age of Onset  . Heart failure Mother     Social History Social History  Substance Use Topics  . Smoking status: Never Smoker  . Smokeless tobacco: Never Used     Comment: tried smoking 3 days 2012  . Alcohol use No     Allergies   Acetaminophen; Other; Tramadol; and Hydrocodone   Review of Systems Review of Systems  Constitutional: Positive for appetite change and fatigue. Negative for chills and fever.  HENT: Positive for rhinorrhea. Negative for congestion, ear pain and sore throat.   Respiratory: Positive for cough and shortness of breath.   Gastrointestinal: Positive for abdominal pain. Negative for nausea and vomiting.  Genitourinary: Negative for dysuria.  Musculoskeletal: Negative for myalgias.  Neurological: Positive for dizziness.     Physical Exam Updated Vital Signs BP 124/84 (BP Location: Right Arm)   Pulse 86   Temp 98.1 F (36.7 C) (Oral)   Resp 18   Ht 5\' 7"  (1.702 m)   Wt 96.2 kg   SpO2 100%   BMI 33.20 kg/m   Physical Exam  Constitutional: She appears well-developed and well-nourished. No distress.  HENT:  Head: Normocephalic and atraumatic.  Mouth/Throat: Oropharynx is clear and moist. No oropharyngeal exudate.  Eyes: Conjunctivae and EOM are normal.  Neck: Normal range  of motion. Neck supple.  Cardiovascular: Normal rate and regular rhythm.   Pulmonary/Chest: Effort normal and breath sounds normal. No respiratory distress. She has no wheezes. She has no rales.  Tachypnea, shallow breathing, reactive cough, rhinorrhea  Abdominal: Soft. She exhibits no distension. There is no tenderness.  Neurological: She is alert. She exhibits normal muscle tone.  Skin: She is not diaphoretic.  Nursing note and vitals reviewed.    ED Treatments / Results  DIAGNOSTIC STUDIES:  Oxygen Saturation is 100% on RA, normal by my interpretation.    COORDINATION OF CARE:  11:07 AM Discussed treatment plan with pt at bedside and pt agreed to plan.   Labs (all labs ordered are listed, but only abnormal results are displayed) Labs Reviewed - No data to display  EKG  EKG Interpretation None       Radiology Dg Chest 2 View  Result Date: 02/01/2016 CLINICAL DATA:  Cough, congestion, shortness of Breath  EXAM: CHEST  2 VIEW COMPARISON:  02/28/2015 FINDINGS: Heart and mediastinal contours are within normal limits. No focal opacities or effusions. No acute bony abnormality. IMPRESSION: No active cardiopulmonary disease. Electronically Signed   By: Charlett NoseKevin  Dover M.D.   On: 02/01/2016 10:53    Procedures Procedures (including critical care time)  Medications Ordered in ED Medications  ketorolac (TORADOL) injection 60 mg (60 mg Intramuscular Given 02/01/16 1129)  albuterol (PROVENTIL HFA;VENTOLIN HFA) 108 (90 Base) MCG/ACT inhaler 2 puff (2 puffs Inhalation Given 02/01/16 1127)     Initial Impression / Assessment and Plan / ED Course  I have reviewed the triage vital signs and the nursing notes.  Pertinent labs & imaging results that were available during my care of the patient were reviewed by me and considered in my medical decision making (see chart for details).     Afebrile, nontoxic patient with constellation of symptoms suggestive of viral syndrome with prolonged  course, suggestive of bronchitis.  Pt breathing shallowly to avoid coughing.  Lungs CTAB.  CXR negative.  Pt with many allergies. Discharged home with z-pak, tessalon perles, symptomatic treatment PCP follow up.  Discussed result, findings, treatment, and follow up  with patient.  Pt given return precautions.  Pt verbalizes understanding and agrees with plan.      Final Clinical Impressions(s) / ED Diagnoses   Final diagnoses:  Bronchitis    New Prescriptions Discharge Medication List as of 02/01/2016 11:14 AM    START taking these medications   Details  azithromycin (ZITHROMAX) 250 MG tablet Take 1 tablet (250 mg total) by mouth daily. Take first 2 tablets together, then 1 every day until finished., Starting Wed 02/01/2016, Print    benzonatate (TESSALON) 100 MG capsule Take 1 capsule (100 mg total) by mouth 2 (two) times daily as needed for cough., Starting Wed 02/01/2016, Print    guaiFENesin-dextromethorphan (ROBITUSSIN DM) 100-10 MG/5ML syrup Take 5 mLs by mouth every 4 (four) hours as needed for cough (and congestion)., Starting Wed 02/01/2016, Print      I personally performed the services described in this documentation, which was scribed in my presence. The recorded information has been reviewed and is accurate.    Trixie Dredgemily Jaasiel [place], PA-C 02/01/16 1408    Tilden FossaElizabeth Rees, MD 02/01/16 [phone]

## 2016-03-19 ENCOUNTER — Emergency Department (HOSPITAL_COMMUNITY): Payer: Medicare HMO

## 2016-03-19 ENCOUNTER — Emergency Department (HOSPITAL_COMMUNITY)
Admission: EM | Admit: 2016-03-19 | Discharge: 2016-03-19 | Disposition: A | Discharge status: Home / Self Care | Payer: Medicare HMO | Attending: Emergency Medicine | Admitting: Emergency Medicine

## 2016-03-19 ENCOUNTER — Encounter (HOSPITAL_COMMUNITY): Payer: Self-pay | Admitting: *Deleted

## 2016-03-19 DIAGNOSIS — R109 Unspecified abdominal pain: Secondary | ICD-10-CM | POA: Insufficient documentation

## 2016-03-19 DIAGNOSIS — Z96642 Presence of left artificial hip joint: Secondary | ICD-10-CM | POA: Insufficient documentation

## 2016-03-19 LAB — URINALYSIS, ROUTINE W REFLEX MICROSCOPIC
BILIRUBIN URINE: NEGATIVE
Glucose, UA: NEGATIVE mg/dL
Hgb urine dipstick: NEGATIVE
KETONES UR: NEGATIVE mg/dL
LEUKOCYTES UA: NEGATIVE
NITRITE: NEGATIVE
PROTEIN: NEGATIVE mg/dL
Specific Gravity, Urine: 1.026 (ref 1.005–1.030)
pH: 5 (ref 5.0–8.0)

## 2016-03-19 LAB — BASIC METABOLIC PANEL
ANION GAP: 6 (ref 5–15)
BUN: 11 mg/dL (ref 6–20)
CHLORIDE: 106 mmol/L (ref 101–111)
CO2: 23 mmol/L (ref 22–32)
Calcium: 9.5 mg/dL (ref 8.9–10.3)
Creatinine, Ser: 0.92 mg/dL (ref 0.44–1.00)
GFR calc Af Amer: >60 mL/min (ref >60)
GFR calc non Af Amer: >60 mL/min (ref >60)
Glucose, Bld: 114 mg/dL — ABNORMAL HIGH (ref 65–99)
POTASSIUM: 3.9 mmol/L (ref 3.5–5.1)
Sodium: 135 mmol/L (ref 135–145)

## 2016-03-19 LAB — CBC
HCT: 40.8 % (ref 36.0–46.0)
Hemoglobin: 13.5 g/dL (ref 12.0–15.0)
MCH: 32.8 pg (ref 26.0–34.0)
MCHC: 33.1 g/dL (ref 30.0–36.0)
MCV: 99.3 fL (ref 78.0–100.0)
Platelets: 371 10*3/uL (ref 150–400)
RBC: 4.11 MIL/uL (ref 3.87–5.11)
RDW: 13.7 % (ref 11.5–15.5)
WBC: 11.9 10*3/uL — ABNORMAL HIGH (ref 4.0–10.5)

## 2016-03-19 MED ORDER — ETODOLAC 200 MG PO CAPS
400.0000 mg | ORAL_CAPSULE | Freq: Once | ORAL | Status: DC
  Filled 2016-03-19: qty 2

## 2016-03-19 MED ORDER — KETOROLAC TROMETHAMINE 15 MG/ML IJ SOLN: 30.0000 mg | Freq: Once | INTRAMUSCULAR | Status: DC

## 2016-03-19 MED ORDER — ETODOLAC 300 MG PO CAPS: 300.0000 mg | ORAL_CAPSULE | Freq: Three times a day (TID) | ORAL | 0 refills | Status: AC

## 2016-03-19 NOTE — ED Notes (Signed)
Pt offered motrin or tylenol per Roselyn BeringJ Knapp; pt refuses.

## 2016-03-19 NOTE — Discharge Instructions (Signed)
Follow-up with your primary care doctor to discuss further treatment, take the medications as needed for pain,

## 2016-03-19 NOTE — ED Notes (Signed)
Pt refuses lodine as ordered and verbalizes "I want my dose of home medication that I have not had since 3 am."  Lynelle DoctorKnapp made aware pt refused and verbalizes "medications that are not NSAIDS will not be prescribed."

## 2016-03-19 NOTE — ED Triage Notes (Signed)
Pt c/o left sided abdominal pain x 2 months,pogresivelly worse lately,  also reports diarrhea.

## 2016-03-19 NOTE — ED Notes (Signed)
Bed: WA02 Expected date:  Expected time:  Means of arrival:  Comments: 

## 2016-03-19 NOTE — ED Notes (Signed)
Pt in CT at present time 

## 2016-03-19 NOTE — ED Notes (Signed)
Pt removed prescription from discharge papers and left in room because she did not want to take it. EDP aware of pt not wanting medication here and rx for home. Pt refused to sign acknowledging she received dc papers. Charge RN at bedside at time of dc. Pt previously refused to be discharged previous to this. See note from previous note from primary nurse. Pt was displeased with pain medication she was dc'ed with and offered prior to dc. EDP aware. Pt is a chronic pain pt who seeks treatment at a pain clinic. Pt was told to follow up there and at her PCP. Pt was told to return with changes or worsening in symptoms. Pt ambulated to lobby with steady gait with no difficulty and no assistance

## 2016-03-19 NOTE — ED Provider Notes (Addendum)
[facility] Provider Note   CSN: [id number] Arrival date & time: 03/19/16  1028     History   Chief Complaint Chief Complaint  Patient presents with  . Abdominal Pain    HPI Judith Hardy is a 46 y.o. female.  HPI Patient presents to the emergency room with complaints of left flank pain.  This pain started several months ago and has been worsening. The last few days the pain is more severe. The pain radiates from the left flank down to the left groin area.  There is nothing that she can think of that specifically reproduces the pain. Sometimes certain movements but that is not consistent. She denies any dysuria. She denies any vomiting or diarrhea. She denies any numbness or weakness. No change in her appetite. No fevers or chills. Patient is concerned and is requesting a CT scan. Past Medical History:  Diagnosis Date  . Anxiety   . Arthritis   . Bursitis   . Chronic lower back pain   . Depression   . Osteoarthritis   . Pneumonia 12/16  . Sciatica     Patient Active Problem List   Diagnosis Date Noted  . Primary osteoarthritis of left hip 03/08/2015  . CAP (community acquired pneumonia) 12/24/2014  . Nausea & vomiting 12/24/2014  . Postoperative abdominal pain 05/27/2014  . HNP (herniated nucleus pulposus), lumbar 08/02/2011    Past Surgical History:  Procedure Laterality Date  . APPENDECTOMY  2011  . BACK SURGERY    . HERNIA REPAIR  4/16   umbilical  . JOINT REPLACEMENT    . LUMBAR LAMINECTOMY/DECOMPRESSION MICRODISCECTOMY  01/10/2011   Procedure: LUMBAR LAMINECTOMY/DECOMPRESSION MICRODISCECTOMY;  Surgeon: Javier Docker;  Location: [facility];  Service: Orthopedics;  Laterality: N/A;  Decompression L4 - L5  (X-Ray)  . LUMBAR LAMINECTOMY/DECOMPRESSION MICRODISCECTOMY  08/02/2011   Procedure: LUMBAR LAMINECTOMY/DECOMPRESSION MICRODISCECTOMY;  Surgeon: Javier Docker, MD;  Location: [facility];  Service: Orthopedics;  Laterality: N/A;  Re-do Decompression L4-L5  .  TOTAL HIP ARTHROPLASTY Left 03/08/2015   ANTERIOR APPROACH  . TOTAL HIP ARTHROPLASTY Left 03/08/2015   Procedure: TOTAL HIP ARTHROPLASTY ANTERIOR APPROACH;  Surgeon: Marcene Corning, MD;  Location: [facility];  Service: Orthopedics;  Laterality: Left;    OB History    No data available       Home Medications    Prior to Admission medications   Medication Sig Start Date End Date Taking? Authorizing Provider  amphetamine-dextroamphetamine (ADDERALL) 10 MG tablet Take 10 mg by mouth daily with breakfast.   Yes Historical Provider, MD  clonazePAM (KLONOPIN) 1 MG tablet Take 1 mg by mouth 4 (four) times daily as needed for anxiety.    Yes Historical Provider, MD  gabapentin (NEURONTIN) 600 MG tablet Take 600-1,200 mg by mouth 2 (two) times daily. Pt takes one tablet in the morning if needed and two at bedtime.   Yes Historical Provider, MD  HYDROmorphone (DILAUDID) 8 MG tablet Take 8 mg by mouth every 6 (six) hours as needed for severe pain.   Yes Historical Provider, MD  Lysine 1000 MG TABS Take 3,000 mg by mouth daily.   Yes Historical Provider, MD  ondansetron (ZOFRAN-ODT) 4 MG disintegrating tablet Take 4 mg by mouth every 8 (eight) hours as needed for nausea or vomiting.    Yes Historical Provider, MD  traZODone (DESYREL) 150 MG tablet Take by mouth at bedtime.   Yes Historical Provider, MD  venlafaxine XR (EFFEXOR-XR) 150 MG 24 hr capsule Take 300 mg  by mouth daily with breakfast.    Yes Historical Provider, MD  etodolac (LODINE) 300 MG capsule Take 1 capsule (300 mg total) by mouth every 8 (eight) hours. 03/19/16   Linwood Dibbles, MD    Family History Family History  Problem Relation Age of Onset  . Heart failure Mother     Social History Social History  Substance Use Topics  . Smoking status: Never Smoker  . Smokeless tobacco: Never Used     Comment: tried smoking 3 days 2012  . Alcohol use No     Allergies   Acetaminophen; Other; Tramadol; and Hydrocodone   Review of  Systems Review of Systems  All other systems reviewed and are negative.    Physical Exam Updated Vital Signs BP 132/95 (BP Location: Right Arm)   Pulse 87   Temp 98.4 F (36.9 C) (Oral)   Resp 16   SpO2 100%   Physical Exam  Constitutional: She appears well-developed and well-nourished. No distress.  HENT:  Head: Normocephalic and atraumatic.  Right Ear: External ear normal.  Left Ear: External ear normal.  Eyes: Conjunctivae are normal. Right eye exhibits no discharge. Left eye exhibits no discharge. No scleral icterus.  Neck: Neck supple. No tracheal deviation present.  Cardiovascular: Normal rate, regular rhythm and intact distal pulses.   Pulmonary/Chest: Effort normal and breath sounds normal. No stridor. No respiratory distress. She has no wheezes. She has no rales.  Abdominal: Soft. Bowel sounds are normal. She exhibits no distension. There is no tenderness. There is no rebound and no guarding.  No CVA tenderness  Musculoskeletal: She exhibits no edema or tenderness.  Neurological: She is alert. She has normal strength. No cranial nerve deficit (no facial droop, extraocular movements intact, no slurred speech) or sensory deficit. She exhibits normal muscle tone. She displays no seizure activity. Coordination normal.  Skin: Skin is warm and dry. No rash noted.  Psychiatric: She has a normal mood and affect.  Nursing note and vitals reviewed.    ED Treatments / Results  Labs (all labs ordered are listed, but only abnormal results are displayed) Labs Reviewed  CBC - Abnormal; Notable for the following:       Result Value   WBC 11.9 (*)    All other components within normal limits  BASIC METABOLIC PANEL - Abnormal; Notable for the following:    Glucose, Bld 114 (*)    All other components within normal limits  URINALYSIS, ROUTINE W REFLEX MICROSCOPIC    EKG  EKG Interpretation None       Radiology Ct Renal Stone Study  Result Date: 03/19/2016 CLINICAL  DATA:  Chronic progressive left-sided abdominal pain. Diarrhea. EXAM: CT ABDOMEN AND PELVIS WITHOUT CONTRAST TECHNIQUE: Multidetector CT imaging of the abdomen and pelvis was performed following the standard protocol without IV contrast. COMPARISON:  CT scan dated 10/15/2014 FINDINGS: Lower chest: Tiny left pleural effusion.  Otherwise normal. Hepatobiliary: No focal liver abnormality is seen. No gallstones, gallbladder wall thickening, or biliary dilatation. Pancreas: Unremarkable. No pancreatic ductal dilatation or surrounding inflammatory changes. Spleen: Normal in size without focal abnormality. Adrenals/Urinary Tract: Adrenal glands are unremarkable. Kidneys are normal, without renal calculi, focal lesion, or hydronephrosis. Bladder is unremarkable. Stomach/Bowel: Stomach is within normal limits. Appendectomy. No evidence of bowel wall thickening, distention, or inflammatory changes. Vascular/Lymphatic: No significant vascular findings are present. No enlarged abdominal or pelvic lymph nodes. Reproductive: Uterus ovaries are normal.  IUD in place. Other: No abdominal wall hernia or abnormality. No abdominopelvic  ascites. Musculoskeletal: Left total hip prosthesis.  Otherwise normal. IMPRESSION: Benign-appearing abdomen and pelvis. Tiny nonspecific left pleural effusion. Electronically Signed   By: Francene BoyersJames  Maxwell M.D.   On: 03/19/2016 12:40    Procedures Procedures (including critical care time)  Medications Ordered in ED Medications - No data to display   Initial Impression / Assessment and Plan / ED Course  I have reviewed the triage vital signs and the nursing notes.  Pertinent labs & imaging results that were available during my care of the patient were reviewed by me and considered in my medical decision making (see chart for details).   the patient's laboratory tests and x-rays were reassuring. There are no acute findings noted on the CT scan to account for her left flank and abdominal pain.  She does not have a renal mass. She does not have a ureteral stone. There is no evidence of any hernia or other mass noted CT scan.  The patient is frustrated by the lack of diagnosis. However I reassured her that we were able to rule out several diagnoses today. Recommend outpatient follow-up with her primary doctor  Final Clinical Impressions(s) / ED Diagnoses   Final diagnoses:  Left flank pain  Flank pain    New Prescriptions New Prescriptions   ETODOLAC (LODINE) 300 MG CAPSULE    Take 1 capsule (300 mg total) by mouth every 8 (eight) hours.     Linwood DibblesJon Ileene Allie, MD 03/19/16 1353  Had another discussion with patient about the ED evaluation.  She is concerned that she is still leaving the ED with pain.  I wrote an Rx for lodine.  Pt does have pain medications at home as well through her pain management doctor.  I discussed overall goal of avoiding opiates because of their addiction potential and the lack of a specific diagnosis that we are treating.  I do recommend follow up with her orthopedist and pain management doctor.    Linwood DibblesJon Ginelle Bays, MD 03/19/16 [phone]

## 2016-03-19 NOTE — ED Notes (Signed)
Post reviewing discharge instructions pt refuses to sign until given dose of pain medication. Lynelle DoctorKnapp made aware. See MAR for orders.

## 2016-06-13 IMAGING — CR DG HIP (WITH OR WITHOUT PELVIS) 2-3V*L*
3 series · 3 of 3 positions shown · non-contrast
Comparison: 04/15/2015

EXAM:
DG HIP (WITH OR WITHOUT PELVIS) 2-3V LEFT

[t pelvis ap]
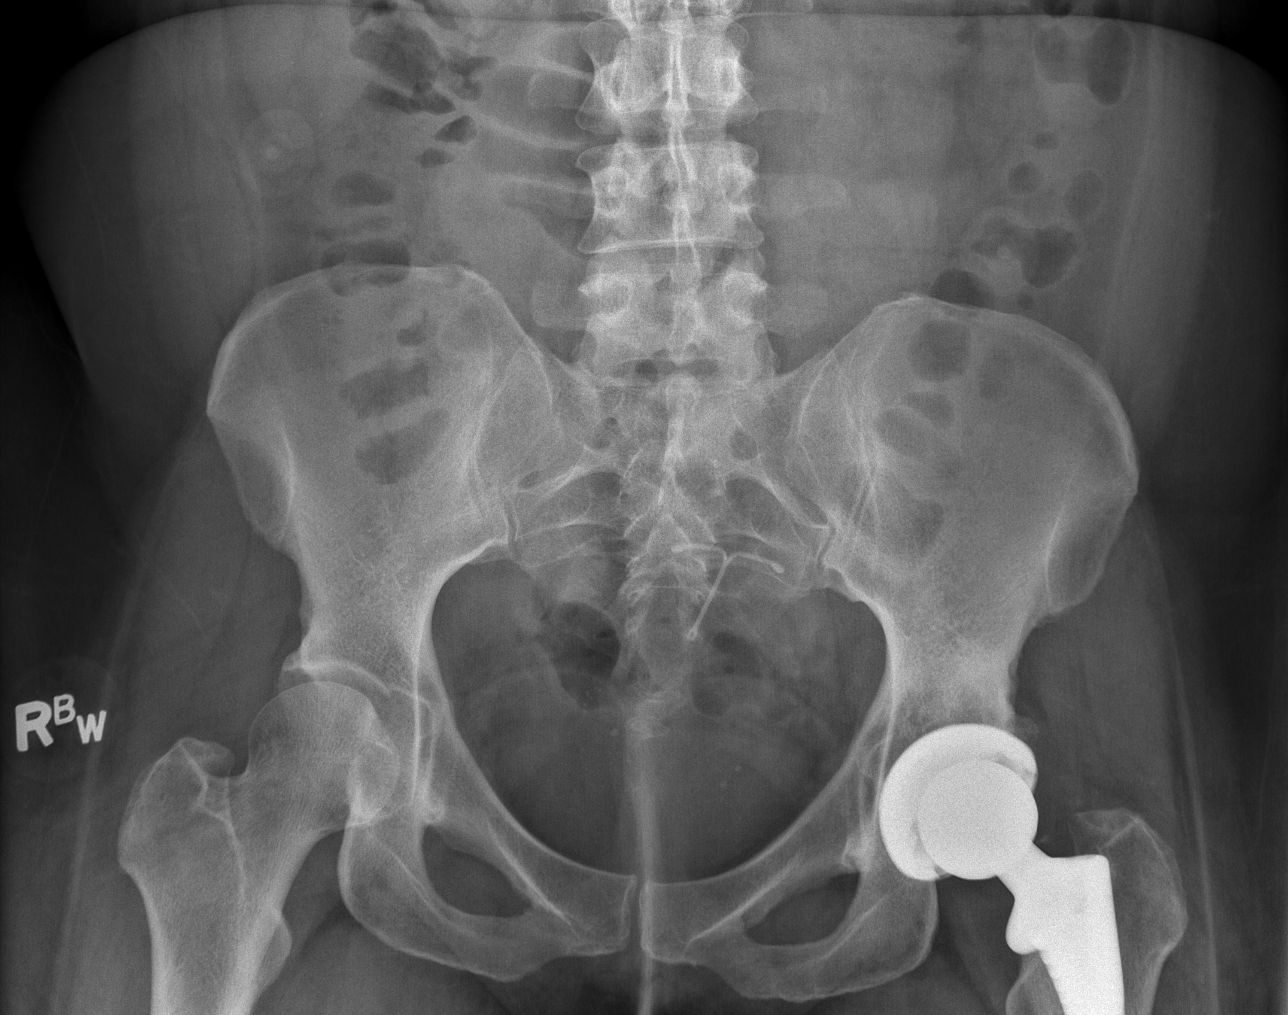

[t hip ap left]
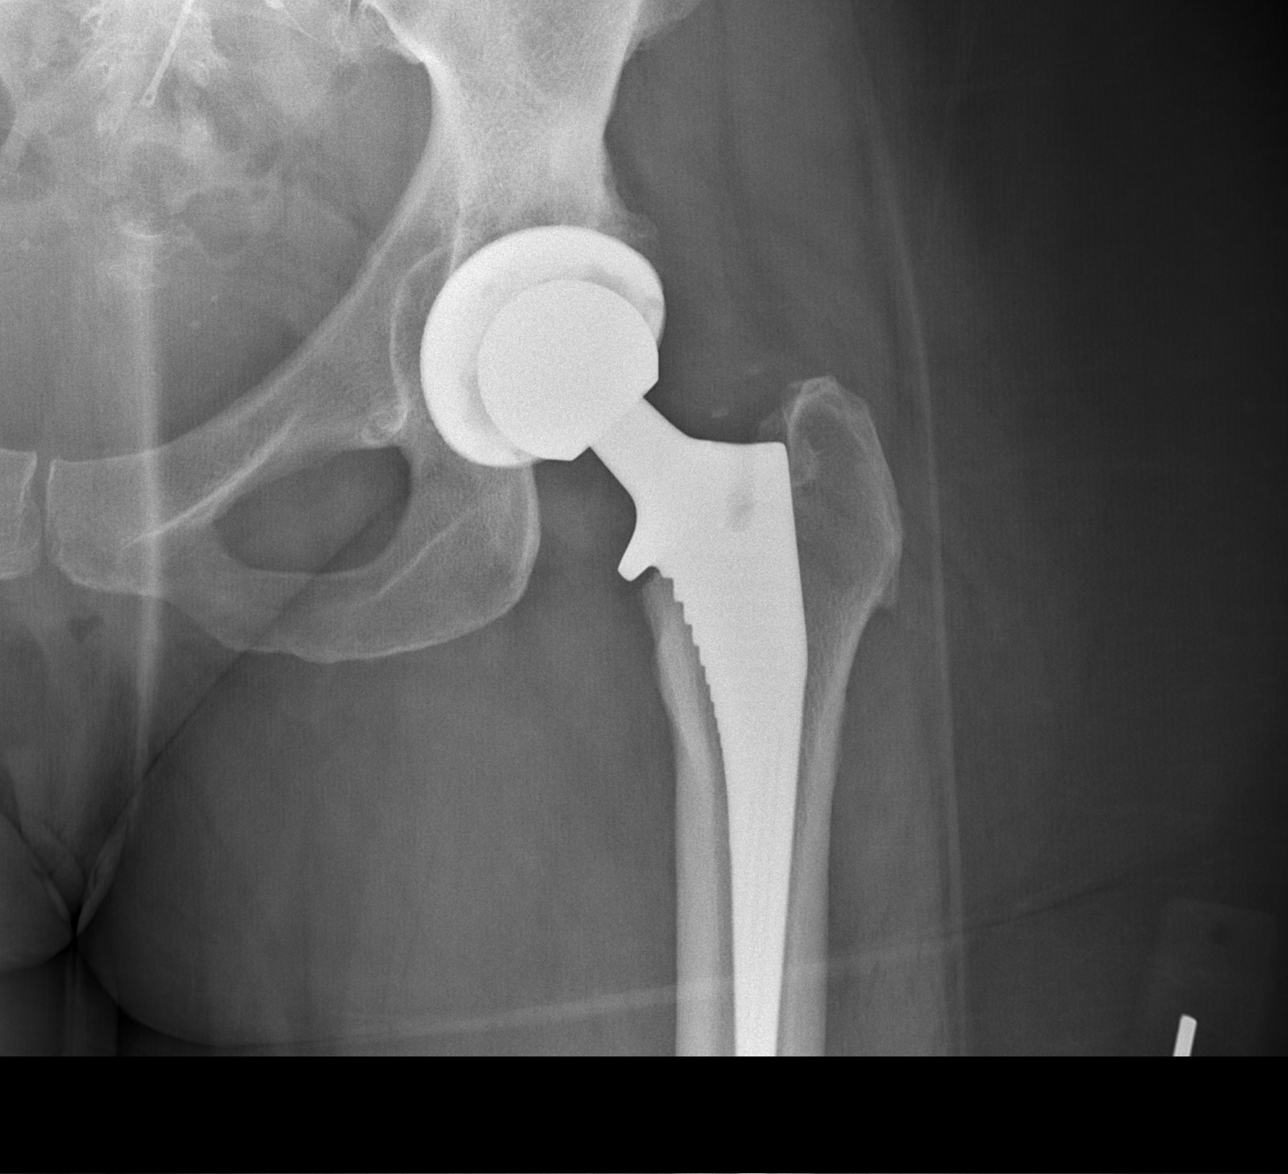

[t hip frog leg left]
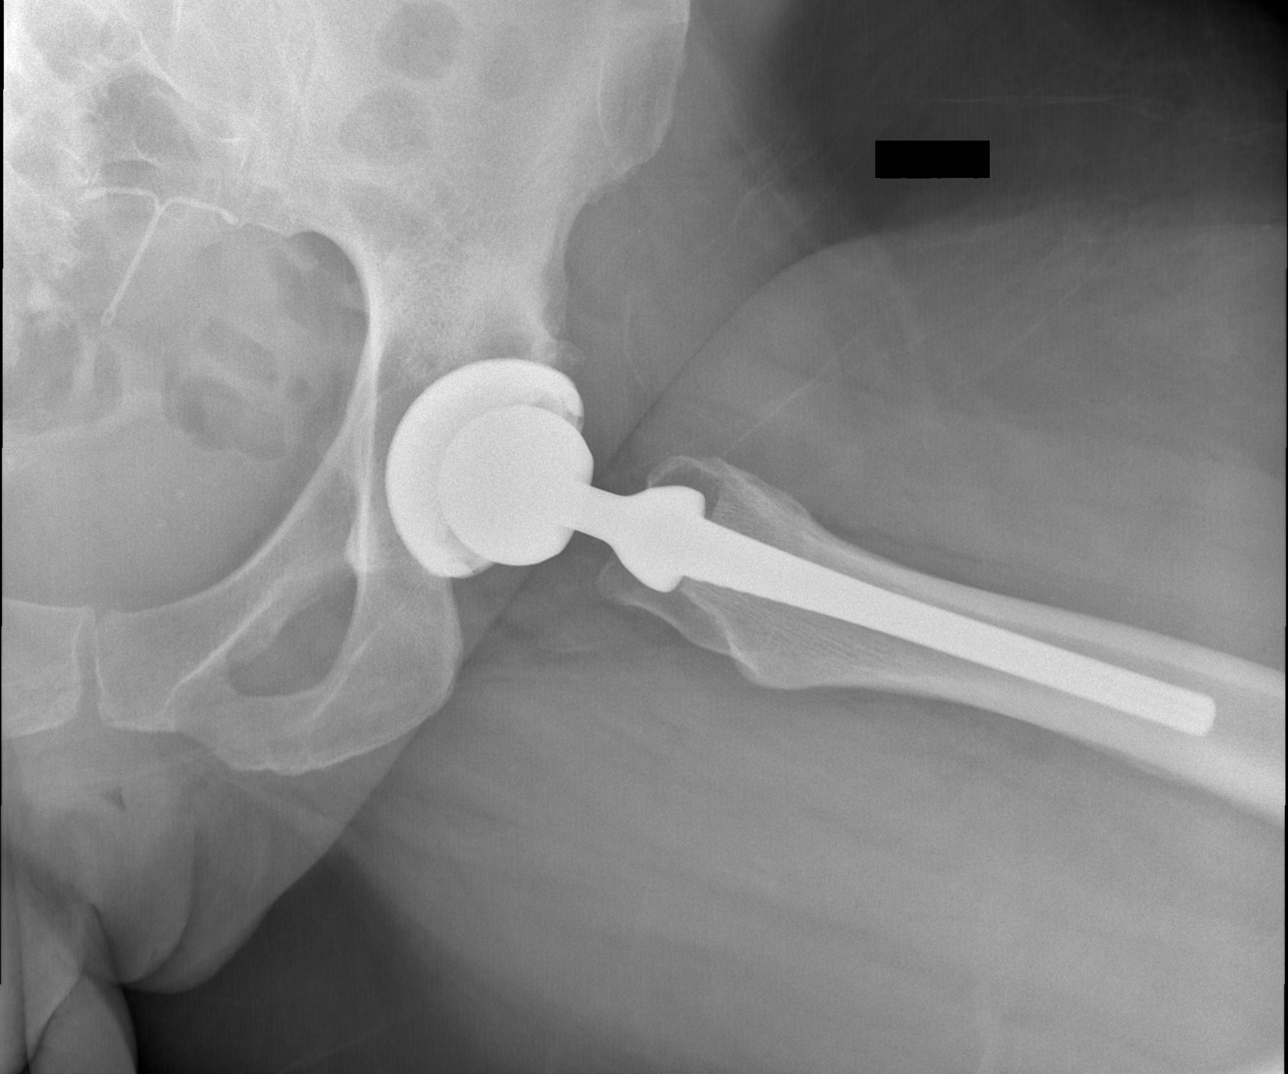

[3 of 3 positions shown; findings below may reference images not displayed]

FINDINGS: No fracture.

Left total hip arthroplasty is well-seated and aligned.

Right hip joint, SI joints and symphysis pubis are normally spaced
and aligned.

No bone lesion.

Soft tissues are unremarkable.
IMPRESSION: 1. No fracture or acute finding.
2. Well-positioned left hip total arthroplasty with no evidence of
loosening.
3. Stable appearance from the prior exam.

## 2017-02-17 IMAGING — CR DG CHEST 2V
2 series · 2 of 2 positions shown · non-contrast
Comparison: 02/28/2015

CLINICAL DATA: Cough, congestion, shortness of Breath

EXAM:
CHEST  2 VIEW

[w chest pa]
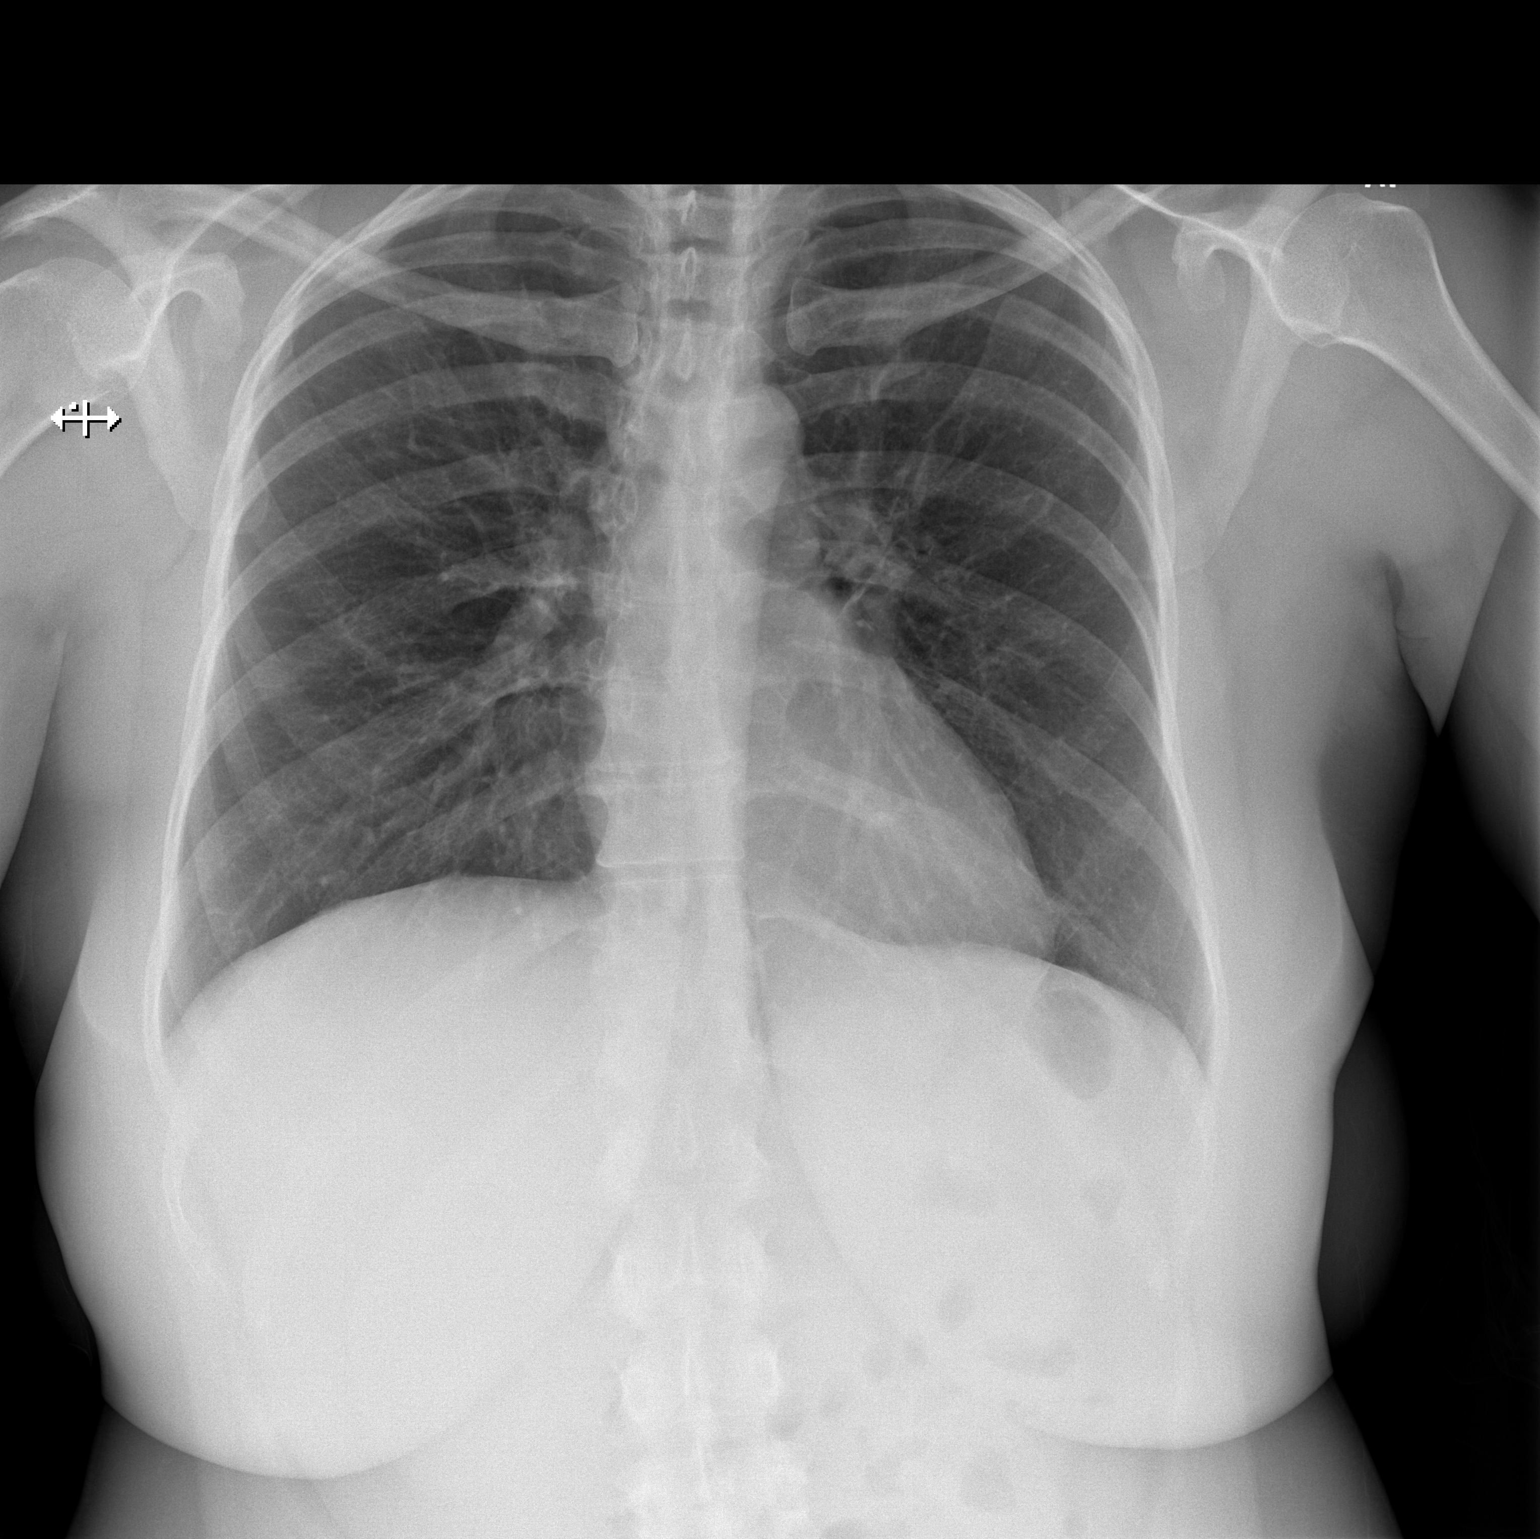

[w chest lat]
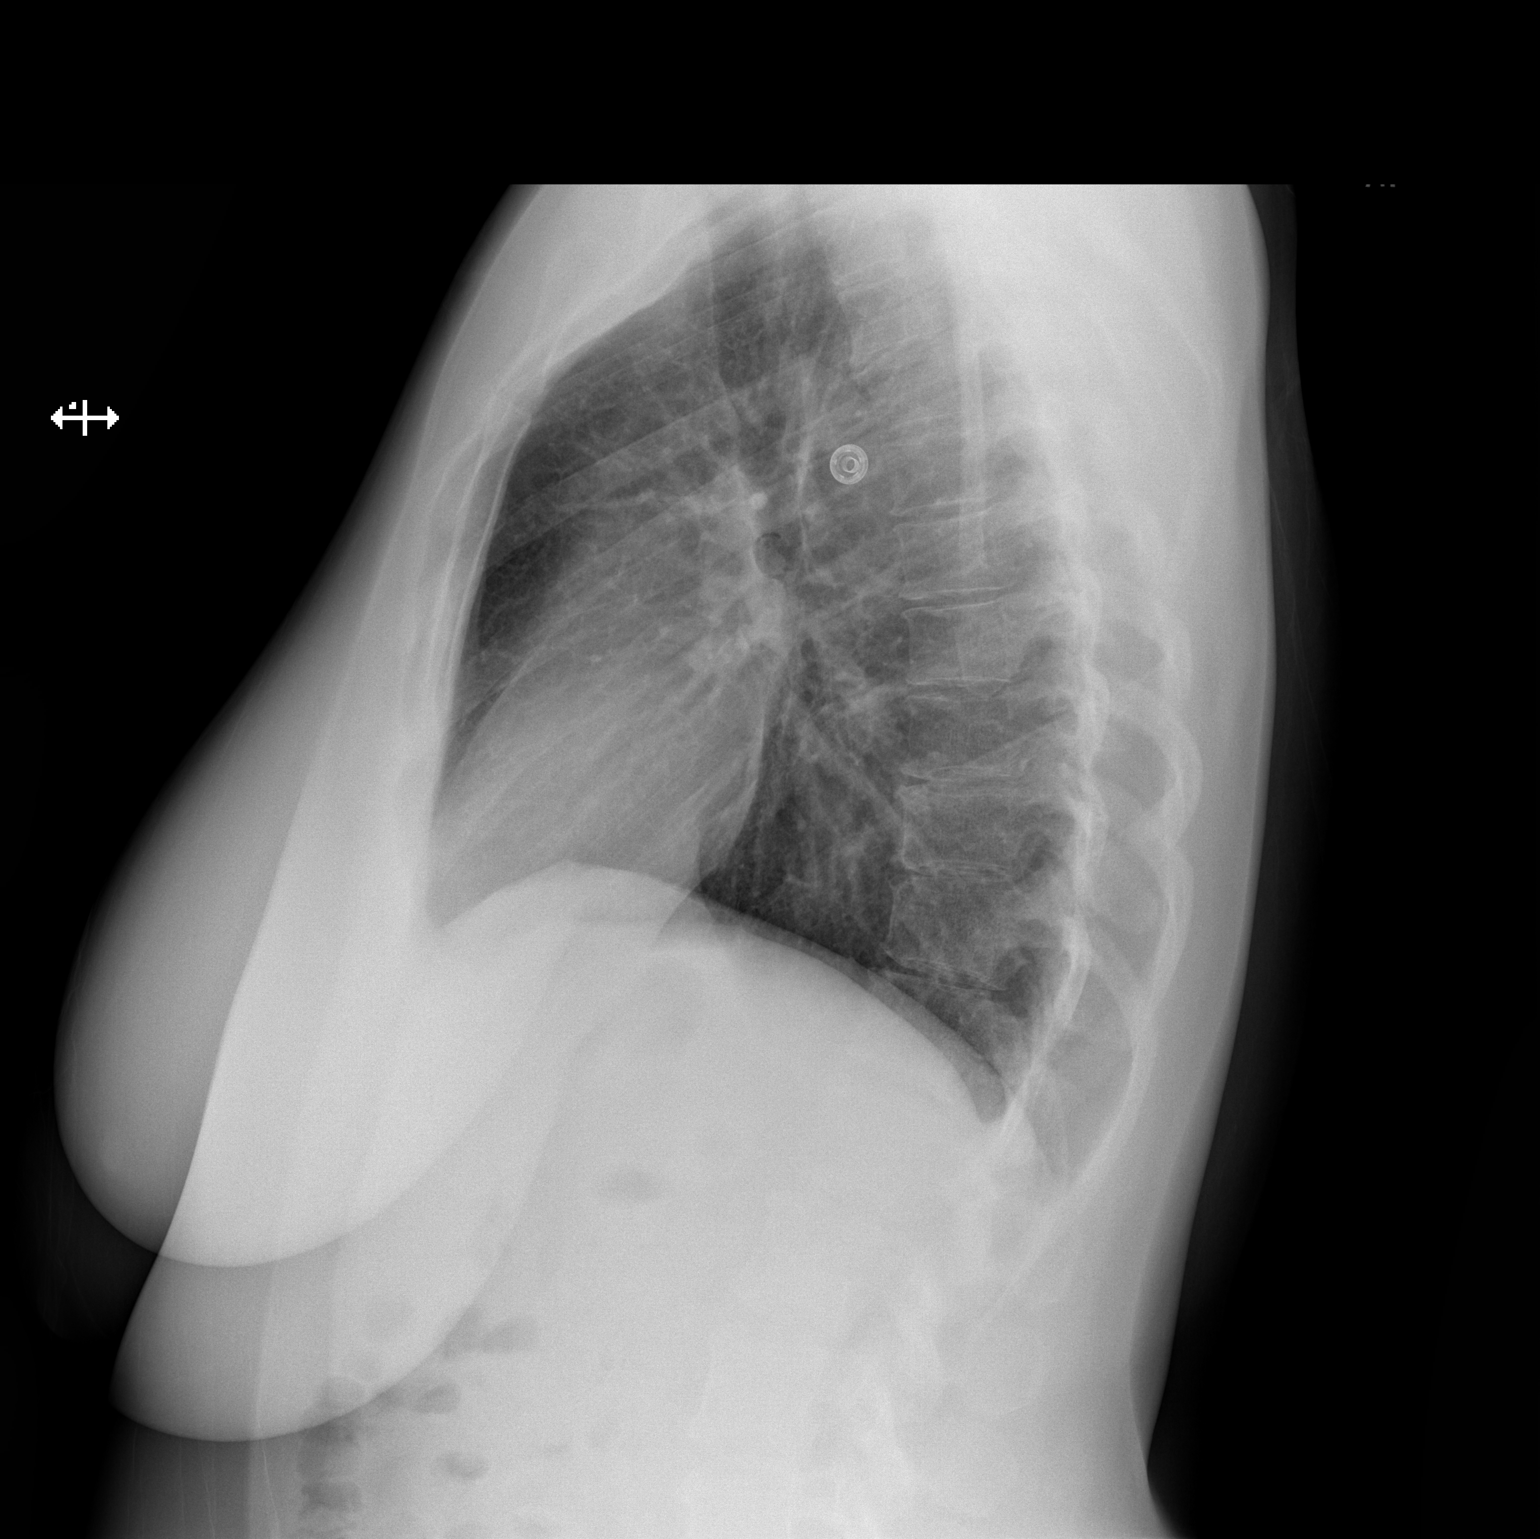

[2 of 2 positions shown; findings below may reference images not displayed]

FINDINGS: Heart and mediastinal contours are within normal limits. No focal
opacities or effusions. No acute bony abnormality.
IMPRESSION: No active cardiopulmonary disease.

## 2017-08-22 ENCOUNTER — Emergency Department (HOSPITAL_COMMUNITY)
Admission: EM | Admit: 2017-08-22 | Discharge: 2017-08-22 | Disposition: A | Discharge status: Home / Self Care | Payer: Medicare HMO | Attending: Emergency Medicine | Admitting: Emergency Medicine

## 2017-08-22 ENCOUNTER — Other Ambulatory Visit: Payer: Self-pay

## 2017-08-22 ENCOUNTER — Encounter (HOSPITAL_COMMUNITY): Payer: Self-pay | Admitting: Emergency Medicine

## 2017-08-22 DIAGNOSIS — Z79899 Other long term (current) drug therapy: Secondary | ICD-10-CM | POA: Insufficient documentation

## 2017-08-22 DIAGNOSIS — M545 Low back pain, unspecified: Secondary | ICD-10-CM

## 2017-08-22 DIAGNOSIS — G8929 Other chronic pain: Secondary | ICD-10-CM

## 2017-08-22 MED ORDER — HYDROMORPHONE HCL 1 MG/ML IJ SOLN
1.0000 mg | Freq: Once | INTRAMUSCULAR | Status: AC
  Administered 2017-08-22: 1 mg via INTRAVENOUS

## 2017-08-22 MED ORDER — HYDROMORPHONE HCL 1 MG/ML IJ SOLN
1.0000 mg | Freq: Once | INTRAMUSCULAR | Status: DC
  Filled 2017-08-22: qty 1

## 2017-08-22 MED ORDER — HYDROMORPHONE HCL 1 MG/ML IJ SOLN
1.0000 mg | Freq: Once | INTRAMUSCULAR | Status: AC
  Administered 2017-08-22: 1 mg via INTRAVENOUS
  Filled 2017-08-22: qty 1

## 2017-08-22 NOTE — Discharge Instructions (Addendum)
Please return for any problem.  Follow-up with your regular doctor as instructed. °

## 2017-08-22 NOTE — ED Triage Notes (Signed)
Per [facility], Pt reports worsening chronic back pain. Pt reports feeling weakness for the last few days. Pt was at [employer] today with family. Pt had to sit down, family reports she was disoriented and started shaking. Pt takes dilaudid, gabapentin, trazadone, and meloxicam for pain, last dose dilaudid at 7:30. Pt alert, responsive, VSS, in no acute distress.

## 2017-08-22 NOTE — ED Notes (Signed)
Pt given water per Casey(RN)

## 2017-08-22 NOTE — ED Provider Notes (Signed)
[facility] Provider Note   CSN: [id number] Arrival date & time: 08/22/17  1814     History   Chief Complaint Chief Complaint  Patient presents with  . Weakness  . Back Pain    HPI Judith Hardy is a 47 y.o. female.  47 year old female with prior history as detailed below presents with complaint of increased low back pain.  Patient's family and herself report that she was busy today and did not remember to take her chronic pain meds.  This afternoon the pain became more uncomfortable.  She denies any specific inciting event other than not having taken any pain medicine since 7:30 in the morning.  She denies any recent fall or trauma.  She denies any weakness or paresthesias to the lower extremities.  She was ambulatory just prior to arrival.  The history is provided by the patient and medical records.  Back Pain   This is a chronic problem. The current episode started more than 1 week ago. The problem occurs constantly. The problem has not changed since onset.The pain is associated with no known injury. The pain is present in the lumbar spine. The quality of the pain is described as stabbing and shooting. The pain is mild. The symptoms are aggravated by certain positions.    Past Medical History:  Diagnosis Date  . Anxiety   . Arthritis   . Bursitis   . Chronic lower back pain   . Depression   . Osteoarthritis   . Pneumonia 12/16  . Sciatica     Patient Active Problem List   Diagnosis Date Noted  . Primary osteoarthritis of left hip 03/08/2015  . CAP (community acquired pneumonia) 12/24/2014  . Nausea & vomiting 12/24/2014  . Postoperative abdominal pain 05/27/2014  . HNP (herniated nucleus pulposus), lumbar 08/02/2011    Past Surgical History:  Procedure Laterality Date  . APPENDECTOMY  2011  . BACK SURGERY    . HERNIA REPAIR  4/16   umbilical  . JOINT REPLACEMENT    . LUMBAR LAMINECTOMY/DECOMPRESSION MICRODISCECTOMY   01/10/2011   Procedure: LUMBAR LAMINECTOMY/DECOMPRESSION MICRODISCECTOMY;  Surgeon: [name];  Location: [facility];  Service: Orthopedics;  Laterality: N/A;  Decompression L4 - L5  (X-Ray)  . LUMBAR LAMINECTOMY/DECOMPRESSION MICRODISCECTOMY  08/02/2011   Procedure: LUMBAR LAMINECTOMY/DECOMPRESSION MICRODISCECTOMY;  Surgeon: [name], MD;  Location: [facility];  Service: Orthopedics;  Laterality: N/A;  Re-do Decompression L4-L5  . TOTAL HIP ARTHROPLASTY Left 03/08/2015   ANTERIOR APPROACH  . TOTAL HIP ARTHROPLASTY Left 03/08/2015   Procedure: TOTAL HIP ARTHROPLASTY ANTERIOR APPROACH;  Surgeon: [name], MD;  Location: [facility];  Service: Orthopedics;  Laterality: Left;     OB History   None      Home Medications    Prior to Admission medications   Medication Sig Start Date End Date Taking? Authorizing Provider  clonazePAM (KLONOPIN) 0.5 MG tablet Take 0.5 mg by mouth every 8 (eight) hours.    Yes [provider]  gabapentin (NEURONTIN) 600 MG tablet Take 600 mg by mouth at bedtime.    Yes [provider]  HYDROmorphone (DILAUDID) 8 MG tablet Take 8 mg by mouth every 4 (four) hours as needed for severe pain.    Yes [provider]  Lysine 1000 MG TABS Take 3,000 mg by mouth daily.   Yes [provider]  meloxicam (MOBIC) 15 MG tablet Take 15 mg by mouth daily as needed for pain. 07/03/17  Yes [provider]  naproxen sodium (ALEVE) 220 MG tablet Take 220 mg by mouth daily as needed (pain).   Yes [provider]  traZODone (DESYREL) 150 MG tablet Take 150 mg by mouth at bedtime.    Yes [provider]  venlafaxine XR (EFFEXOR-XR) 150 MG 24 hr capsule Take 300 mg by mouth daily with breakfast.    Yes [provider]  etodolac (LODINE) 300 MG capsule Take 1 capsule (300 mg total) by mouth every 8 (eight) hours. Patient not taking: Reported on 08/22/2017 03/19/16   Linwood DibblesKnapp, Jon, MD    Family History Family History    Problem Relation Age of Onset  . Heart failure Mother     Social History Social History   Tobacco Use  . Smoking status: Never Smoker  . Smokeless tobacco: Never Used  . Tobacco comment: tried smoking 3 days 2012  Substance Use Topics  . Alcohol use: No  . Drug use: No     Allergies   Acetaminophen; Other; Tramadol; and Hydrocodone   Review of Systems Review of Systems  Musculoskeletal: Positive for back pain.  All other systems reviewed and are negative.    Physical Exam Updated Vital Signs BP (!) 115/91   Pulse 74   Temp 98.5 F (36.9 C) (Oral)   Resp 13   Ht 5\' 9"  (1.753 m)   Wt 78.9 kg   SpO2 100%   BMI 25.70 kg/m   Physical Exam  Constitutional: She is oriented to person, place, and time. She appears well-developed and well-nourished. No distress.  HENT:  Head: Normocephalic and atraumatic.  Mouth/Throat: Oropharynx is clear and moist.  Eyes: Pupils are equal, round, and reactive to light. Conjunctivae and EOM are normal.  Neck: Normal range of motion. Neck supple.  Cardiovascular: Normal rate, regular rhythm and normal heart sounds.  Pulmonary/Chest: Effort normal and breath sounds normal. No respiratory distress.  Abdominal: Soft. She exhibits no distension. There is no tenderness.  Musculoskeletal: Normal range of motion. She exhibits no edema or deformity.  Diffuse paraspinal low back tenderness.  Both lower extremities with 5 out of 5 strength.  DTR's intact bilaterally in the lower extremities.  Neurological: She is alert and oriented to person, place, and time.  Skin: Skin is warm and dry.  Psychiatric: She has a normal mood and affect.  Nursing note and vitals reviewed.    ED Treatments / Results  Labs (all labs ordered are listed, but only abnormal results are displayed) Labs Reviewed - No data to display  EKG EKG Interpretation  Date/Time:  Thursday August 22 2017 18:33:35 EDT Ventricular Rate:  86 PR Interval:    QRS  Duration: 96 QT Interval:  352 QTC Calculation: 421 R Axis:   15 Text Interpretation:  Sinus rhythm Probable left atrial enlargement RSR' in V1 or V2, right VCD or RVH Confirmed by Kristine RoyalMessick, Peter [phone]([id number]) on 08/22/2017 6:36:21 PM   Radiology No results found.  Procedures Procedures (including critical care time)  Medications Ordered in ED Medications  HYDROmorphone (DILAUDID) injection 1 mg (1 mg Intravenous Given 08/22/17 1858)  HYDROmorphone (DILAUDID) injection 1 mg (1 mg Intravenous Given 08/22/17 2120)     Initial Impression / Assessment and Plan / ED Course  I have reviewed the triage vital signs and the nursing notes.  Pertinent labs & imaging results that were available during my care of the patient were reviewed by me and considered in my medical decision making (see chart for details).  MDM  Screen complete  Patient is presenting with acute on chronic back pain.  Her presentation appears to be secondary to missing several of her regular pain medications over the course of today.  She does not have any evidence of significant trauma.  Patient feels improved following ministration of pain medicine ED.  She desires discharge home.  She is aware of need for close follow-up.  Strict return precautions are given and understood.  Final Clinical Impressions(s) / ED Diagnoses   Final diagnoses:  Chronic low back pain without sciatica, unspecified back pain laterality    ED Discharge Orders    None       Wynetta Fines, MD 08/22/17 2218

## 2017-09-14 ENCOUNTER — Other Ambulatory Visit: Payer: Self-pay

## 2017-09-14 ENCOUNTER — Emergency Department (HOSPITAL_COMMUNITY): Payer: Medicare HMO

## 2017-09-14 ENCOUNTER — Encounter (HOSPITAL_COMMUNITY): Payer: Self-pay | Admitting: *Deleted

## 2017-09-14 ENCOUNTER — Emergency Department (HOSPITAL_COMMUNITY)
Admission: EM | Admit: 2017-09-14 | Discharge: 2017-09-15 | Disposition: A | Discharge status: Home / Self Care | Payer: Medicare HMO | Attending: Emergency Medicine | Admitting: Emergency Medicine

## 2017-09-14 DIAGNOSIS — Z96642 Presence of left artificial hip joint: Secondary | ICD-10-CM | POA: Diagnosis not present

## 2017-09-14 DIAGNOSIS — Z79899 Other long term (current) drug therapy: Secondary | ICD-10-CM | POA: Diagnosis not present

## 2017-09-14 DIAGNOSIS — G8929 Other chronic pain: Secondary | ICD-10-CM

## 2017-09-14 DIAGNOSIS — M5441 Lumbago with sciatica, right side: Secondary | ICD-10-CM

## 2017-09-14 DIAGNOSIS — M545 Low back pain: Secondary | ICD-10-CM | POA: Insufficient documentation

## 2017-09-14 DIAGNOSIS — M5442 Lumbago with sciatica, left side: Secondary | ICD-10-CM

## 2017-09-14 MED ORDER — CYCLOBENZAPRINE HCL 10 MG PO TABS
10.0000 mg | ORAL_TABLET | Freq: Once | ORAL | Status: AC
  Administered 2017-09-14: 10 mg via ORAL
  Filled 2017-09-14: qty 1

## 2017-09-14 MED ORDER — HYDROMORPHONE HCL 2 MG PO TABS
4.0000 mg | ORAL_TABLET | Freq: Once | ORAL | Status: AC
  Administered 2017-09-14: 4 mg via ORAL
  Filled 2017-09-14: qty 2

## 2017-09-14 MED ORDER — CYCLOBENZAPRINE HCL 10 MG PO TABS: 10.0000 mg | ORAL_TABLET | Freq: Two times a day (BID) | ORAL | 0 refills | Status: AC | PRN

## 2017-09-14 NOTE — ED Notes (Signed)
Pt is requesting that she be moved back quickly. She expresses a 11/10 pain.

## 2017-09-14 NOTE — Discharge Instructions (Addendum)
Your x-rays today showed the following: Mild degenerative changes. Normal alignment. No acute bony abnormalities. There are not any new changes or fractures seen.  Your pain is consistent with sciatica. Standard treatment for sciatica includes steroids, a muscle relaxer and an anti-inflammatory. I have offered all of these things today. Given the past adverse reaction you have to steroids, I will just prescribe a muscle relaxer today. You have a current prescription for Hydromorphone at home. I recommend that you continue taking this as you are prescribed and to follow-up with your pain doctor as scheduled.

## 2017-09-14 NOTE — ED Notes (Signed)
EDPA at bedside Pt cursing in hallway about hallway accommodations and lack of pain medication being ordered. Pt states "ready to get the fuck out of here and just go fucking complain."

## 2017-09-14 NOTE — ED Triage Notes (Signed)
The pt is c/o more lower back pain after falling in the shower this am  Two prior back surgeries  She reports that she cannot feel her legs  With leg weakness  And rt shoulder pain

## 2017-09-14 NOTE — ED Notes (Signed)
Pt was upset with dose of Flexeril. States "is that all she is giving me? She's not giving me anything for pain?" Asked when she would be placed into a room and explained to patient that currently all rooms were occupied by patient's requiring continuous monitoring and the use of the hall bed was to allow her to be seen in a more timely matter rather than waiting in the lobby. Pt requests to see EDPA and inquire why she wasn't given pain medication.

## 2017-09-15 NOTE — ED Provider Notes (Signed)
[facility] EMERGENCY DEPARTMENT Provider Note  CSN: [id number] Arrival date & time: 09/14/17  1730    History   Chief Complaint Chief Complaint  Patient presents with  . Back Pain    HPI Judith Hardy is a 47 y.o. female with a medical history of chronic low back pain s/p multiple lumbar laminectomies who presented to the ED for acute worsening of back pain x2 days. Patient describes constant, sharp and aching pain in her low back bilaterally. Pain does radiate down the posterior aspect of both legs bilaterally which she describes as sharp and stinging. Patient reports pain worsened after slipping and falling in the shower. Denies fever, other arthralgias, skin rashes/lesions, bowel or bladder incontinence, saddle anesthesia, paresthesias, weakness or gait difficulties. Patient has been taking her Rx Dilaudid that is prescribed by her pain doctor for the pain prior to coming to the ED. Denies additional risk factors of weight loss, IV substance use, recently diagnosed malignancy or EtOH.  Additional history obtained from [place] Controlled Substance Database which shows an active PO Dilaudid 4mg  prescription for 28 days which was lasted refilled the week of 09/02/17.  Past Medical History:  Diagnosis Date  . Anxiety   . Arthritis   . Bursitis   . Chronic lower back pain   . Depression   . Osteoarthritis   . Pneumonia 12/16  . Sciatica     Patient Active Problem List   Diagnosis Date Noted  . Primary osteoarthritis of left hip 03/08/2015  . CAP (community acquired pneumonia) 12/24/2014  . Nausea & vomiting 12/24/2014  . Postoperative abdominal pain 05/27/2014  . HNP (herniated nucleus pulposus), lumbar 08/02/2011    Past Surgical History:  Procedure Laterality Date  . APPENDECTOMY  2011  . BACK SURGERY    . HERNIA REPAIR  4/16   umbilical  . JOINT REPLACEMENT    . LUMBAR LAMINECTOMY/DECOMPRESSION MICRODISCECTOMY  01/10/2011   Procedure: LUMBAR  LAMINECTOMY/DECOMPRESSION MICRODISCECTOMY;  Surgeon: Javier Docker;  Location: [facility];  Service: Orthopedics;  Laterality: N/A;  Decompression L4 - L5  (X-Ray)  . LUMBAR LAMINECTOMY/DECOMPRESSION MICRODISCECTOMY  08/02/2011   Procedure: LUMBAR LAMINECTOMY/DECOMPRESSION MICRODISCECTOMY;  Surgeon: Javier Docker, MD;  Location: [facility];  Service: Orthopedics;  Laterality: N/A;  Re-do Decompression L4-L5  . TOTAL HIP ARTHROPLASTY Left 03/08/2015   ANTERIOR APPROACH  . TOTAL HIP ARTHROPLASTY Left 03/08/2015   Procedure: TOTAL HIP ARTHROPLASTY ANTERIOR APPROACH;  Surgeon: Marcene Corning, MD;  Location: [facility];  Service: Orthopedics;  Laterality: Left;     OB History   None      Home Medications    Prior to Admission medications   Medication Sig Start Date End Date Taking? Authorizing Provider  clonazePAM (KLONOPIN) 0.5 MG tablet Take 0.5 mg by mouth every 8 (eight) hours.     [provider]  cyclobenzaprine (FLEXERIL) 10 MG tablet Take 1 tablet (10 mg total) by mouth 2 (two) times daily as needed for up to 5 days for muscle spasms. 09/14/17 09/19/17  Mortis, Jerrel Ivory I, PA-C  etodolac (LODINE) 300 MG capsule Take 1 capsule (300 mg total) by mouth every 8 (eight) hours. Patient not taking: Reported on 08/22/2017 03/19/16   Linwood Dibbles, MD  gabapentin (NEURONTIN) 600 MG tablet Take 600 mg by mouth at bedtime.     [provider]  HYDROmorphone (DILAUDID) 8 MG tablet Take 8 mg by mouth every 4 (four) hours as needed for severe pain.     [provider]  Lysine 1000 MG TABS Take 3,000 mg by mouth daily.    [provider]  meloxicam (MOBIC) 15 MG tablet Take 15 mg by mouth daily as needed for pain. 07/03/17   [provider]  naproxen sodium (ALEVE) 220 MG tablet Take 220 mg by mouth daily as needed (pain).    [provider]  traZODone (DESYREL) 150 MG tablet Take 150 mg by mouth at bedtime.     [provider]  venlafaxine XR (EFFEXOR-XR)  150 MG 24 hr capsule Take 300 mg by mouth daily with breakfast.     [provider]    Family History Family History  Problem Relation Age of Onset  . Heart failure Mother     Social History Social History   Tobacco Use  . Smoking status: Never Smoker  . Smokeless tobacco: Never Used  . Tobacco comment: tried smoking 3 days 2012  Substance Use Topics  . Alcohol use: No  . Drug use: No     Allergies   Acetaminophen; Other; Tramadol; and Hydrocodone   Review of Systems Review of Systems  Constitutional: Negative for chills, fever and unexpected weight change.  Gastrointestinal: Negative.   Genitourinary: Negative.   Musculoskeletal: Positive for back pain and gait problem. Negative for arthralgias, neck pain and neck stiffness.  Skin: Negative.   Neurological: Negative for weakness and numbness.  Psychiatric/Behavioral: Positive for agitation.   Physical Exam Updated Vital Signs BP (!) 138/112 (BP Location: Right Wrist) Comment: Pt visibly shaking while blood pressure was being taken. RN aware  Pulse 92   Temp 99.1 F (37.3 C) (Oral)   Resp 18   Ht 5\' 9"  (1.753 m)   Wt 78.9 kg   SpO2 100%   BMI 25.70 kg/m   Physical Exam  Constitutional: She appears well-developed and well-nourished.  Lying still on the bed sleeping.  Musculoskeletal:       Cervical back: Normal.       Thoracic back: Normal.       Lumbar back: She exhibits tenderness, pain and spasm. She exhibits normal range of motion and no bony tenderness.  Positive straight leg raises bilaterally. Full ROM in lower extremities bilaterally. No bony tenderness of lower extremities. 5/5 strength.   Neurological: She has normal strength. No sensory deficit. She exhibits normal muscle tone.  Reflex Scores:      Patellar reflexes are 2+ on the right side and 2+ on the left side.      Achilles reflexes are 2+ on the right side and 2+ on the left side. Skin: Skin is warm and intact. Capillary refill  takes less than 2 seconds. No rash noted.  Psychiatric: Her affect is angry. She is agitated.  Nursing note and vitals reviewed.  ED Treatments / Results  Labs (all labs ordered are listed, but only abnormal results are displayed) Labs Reviewed - No data to display  EKG None  Radiology Dg Lumbar Spine Complete  Result Date: 09/14/2017 CLINICAL DATA:  Increased low back pain after fall in the shower today. Two previous back surgeries. Leg weakness and unable to feel her legs. EXAM: LUMBAR SPINE - COMPLETE 4+ VIEW COMPARISON:  CT lumbar spine 09/01/2015. Lumbar spine radiographs 08/30/2015. FINDINGS: Five lumbar type vertebral bodies. Normal alignment of the lumbar spine. No vertebral compression deformities. Mild degenerative changes with narrowed interspaces and small endplate hypertrophic changes. No focal bone lesion or bone destruction. Bone cortex appears intact. Visualized sacrum appears intact. An intrauterine device is demonstrated  in the pelvis. IMPRESSION: Mild degenerative changes. Normal alignment. No acute bony abnormalities. Electronically Signed   By: Burman Nieves M.D.   On: 09/14/2017 23:05    Procedures Procedures (including critical care time)  Medications Ordered in ED Medications  cyclobenzaprine (FLEXERIL) tablet 10 mg (10 mg Oral Given 09/14/17 2226)  HYDROmorphone (DILAUDID) tablet 4 mg (4 mg Oral Given 09/14/17 2357)     Initial Impression / Assessment and Plan / ED Course  Triage vital signs and the nursing notes have been reviewed.  Pertinent labs & imaging results that were available during care of the patient were reviewed and considered in medical decision making (see chart for details).  Patient presents well appearing and in no acute distress. Initially, she is observed lying in the bed calmly sleeping, but when this provider approaches she begins to grunt and grimace as if she were in pain. When this provider finished examining, patient went back to  lying down calmly without issue. Patient has history of past back injury and surgeries. She reports slipping in the shower prior to this new onset of pain. Will obtain imaging to evaluate for acute spinal injury especially given her surgical history. Physical exam is reassuring and consistent with sciatica. There is no midline tenderness, deformities or abnormal neuro findings on exam or in history to suggest an acute spinal cord or osseus pathology that warrant additional imaging today. Denies pathologic red flags for back pain. No systemic s/s to suggest underlying infectious or rheumatologic etiology.  Clinical Course as of Sep 15 33  Sat Sep 14, 2017  2247 Patient requested IV Diluadid. She states that she takes PO Dilaudid 4mg  Q4H at home as prescribed by her pain provider. This Rx was confirmed with the [place] Controlled Substance Database. Given that pt's back pain is likely MSK in etiology with decreased concern of acute fracture given physical exam findings, this provider informed her that IV opiates are not indicated in this instance and will not be given. Flexeril and IM Toradol were offered which she also declines.   [GM]  2338 Lumbar x-ray shows mild degenerative disease. No acute fractures or new malalignments seen.   [GM]  Sun Sep 15, 2017  0002 Patient's x-ray findings were discussed with patient and husband. Patient continues to demand IV Dilaudid. When declined, she begins to cuss and raise her voice with this provider. Patient continues to complain about waiting for an extended time in the ED and the quality of chairs. This provider thoroughly educated patient on sciatica and the standard of care which includes NSAIDs, muscle relaxers and steroids. Patient refuses all these medications and continues to ask for IV medications "I only want an injection." Patient eventually agreed to a home dose of PO Dilaudid, but continued to cuss to nursing staff.   [GM]    Clinical Course User  Index [GM] Mortis, Sharyon Medicus, PA-C    Final Clinical Impressions(s) / ED Diagnoses  1. Chronic Low Back Pain with Sciatica. Rx for Flexeril prescribed for muscle spasm. Patient refused NSAIDs and steroids. Advised to follow-up with pain management doctor or neurosurgery for further management.  Dispo: Home. After thorough clinical evaluation, this patient is determined to be medically stable and can be safely discharged with the previously mentioned treatment and/or outpatient follow-up/referral(s). At this time, there are no other apparent medical conditions that require further screening, evaluation or treatment.   Final diagnoses:  Chronic bilateral low back pain with bilateral sciatica    ED Discharge Orders  Ordered    cyclobenzaprine (FLEXERIL) 10 MG tablet  2 times daily PRN     09/14/17 2343            Windy Carina, PA-C 09/15/17 0036    Margarita Grizzle, MD 09/16/17 2104

## 2017-09-15 NOTE — ED Notes (Signed)
Pt refused discharge vital signs. Assisted to vehicle via wheelchair. Pt in NAD

## 2017-09-28 ENCOUNTER — Emergency Department (HOSPITAL_COMMUNITY)
Admission: EM | Admit: 2017-09-28 | Discharge: 2017-09-28 | Disposition: A | Discharge status: Home / Self Care | Payer: Medicare HMO | Attending: Emergency Medicine | Admitting: Emergency Medicine

## 2017-09-28 ENCOUNTER — Encounter (HOSPITAL_COMMUNITY): Payer: Self-pay | Admitting: *Deleted

## 2017-09-28 DIAGNOSIS — G8929 Other chronic pain: Secondary | ICD-10-CM | POA: Insufficient documentation

## 2017-09-28 DIAGNOSIS — M5442 Lumbago with sciatica, left side: Secondary | ICD-10-CM | POA: Diagnosis not present

## 2017-09-28 DIAGNOSIS — Z96642 Presence of left artificial hip joint: Secondary | ICD-10-CM | POA: Insufficient documentation

## 2017-09-28 DIAGNOSIS — Z79899 Other long term (current) drug therapy: Secondary | ICD-10-CM | POA: Insufficient documentation

## 2017-09-28 DIAGNOSIS — M545 Low back pain: Secondary | ICD-10-CM | POA: Diagnosis present

## 2017-09-28 DIAGNOSIS — M5441 Lumbago with sciatica, right side: Secondary | ICD-10-CM | POA: Diagnosis not present

## 2017-09-28 NOTE — ED Notes (Addendum)
Pt refused MRI and states she wants to be discharged.  MD made aware.

## 2017-09-28 NOTE — ED Triage Notes (Signed)
Pt complains of lower back pain radiating down legs. Pt has hx of chronic back pain, states pain became worse 2 weeks ago after falling.

## 2017-09-28 NOTE — Discharge Instructions (Signed)
Follow-up with your pain management specialist.  As you know we did offer to go ahead and do MRI of your back since you have not had one in 2 years.  And also that she had a new fall.  Reviewed your x-rays of your back from your previous visit and they were negative for any bony abnormalities.  Continue current medications.

## 2017-09-28 NOTE — ED Provider Notes (Signed)
[facility]-EMERGENCY DEPT Provider Note   CSN: [id number] Arrival date & time: 09/28/17  1402     History   Chief Complaint Chief Complaint  Patient presents with  . Back Pain    HPI Judith Hardy is a 47 y.o. female.  Patient with a history of chronic back pain.  Followed by pain management Dr. Tollie Eth.  Is been followed by them for over a year.  Last saw them 2-1/2 weeks ago.  Patient seen by [place] September 7 with similar symptoms today chronic bilateral low back pain with bilateral sciatica.  Patient states she had a fall couple weeks ago he did have x-rays done during that visit on the seventh and no acute bony abnormalities.  Patient states that she is having increased pain.  Denies any urinary difficulty or incontinence.  But does have symptoms to bilateral low back radiating to both legs.  Talks about numbness to the top and bottom of both feet.  No significant weakness.  Pain is causing nausea.  Patient is on a care plan with [place].  Patient is not to receive narcotics.  Patient is on narcotics from pain management.     Past Medical History:  Diagnosis Date  . Anxiety   . Arthritis   . Bursitis   . Chronic lower back pain   . Depression   . Osteoarthritis   . Pneumonia 12/16  . Sciatica     Patient Active Problem List   Diagnosis Date Noted  . Primary osteoarthritis of left hip 03/08/2015  . CAP (community acquired pneumonia) 12/24/2014  . Nausea & vomiting 12/24/2014  . Postoperative abdominal pain 05/27/2014  . HNP (herniated nucleus pulposus), lumbar 08/02/2011    Past Surgical History:  Procedure Laterality Date  . APPENDECTOMY  2011  . BACK SURGERY    . HERNIA REPAIR  4/16   umbilical  . JOINT REPLACEMENT    . LUMBAR LAMINECTOMY/DECOMPRESSION MICRODISCECTOMY  01/10/2011   Procedure: LUMBAR LAMINECTOMY/DECOMPRESSION MICRODISCECTOMY;  Surgeon: Javier Docker;  Location: [facility];  Service: Orthopedics;  Laterality: N/A;  Decompression L4 -  L5  (X-Ray)  . LUMBAR LAMINECTOMY/DECOMPRESSION MICRODISCECTOMY  08/02/2011   Procedure: LUMBAR LAMINECTOMY/DECOMPRESSION MICRODISCECTOMY;  Surgeon: Javier Docker, MD;  Location: [facility];  Service: Orthopedics;  Laterality: N/A;  Re-do Decompression L4-L5  . TOTAL HIP ARTHROPLASTY Left 03/08/2015   ANTERIOR APPROACH  . TOTAL HIP ARTHROPLASTY Left 03/08/2015   Procedure: TOTAL HIP ARTHROPLASTY ANTERIOR APPROACH;  Surgeon: Marcene Corning, MD;  Location: [facility];  Service: Orthopedics;  Laterality: Left;     OB History   None      Home Medications    Prior to Admission medications   Medication Sig Start Date End Date Taking? Authorizing Provider  clonazePAM (KLONOPIN) 0.5 MG tablet Take 0.5 mg by mouth every 8 (eight) hours.     [provider]  etodolac (LODINE) 300 MG capsule Take 1 capsule (300 mg total) by mouth every 8 (eight) hours. Patient not taking: Reported on 08/22/2017 03/19/16   Linwood Dibbles, MD  gabapentin (NEURONTIN) 600 MG tablet Take 600 mg by mouth at bedtime.     [provider]  HYDROmorphone (DILAUDID) 8 MG tablet Take 8 mg by mouth every 4 (four) hours as needed for severe pain.     [provider]  Lysine 1000 MG TABS Take 3,000 mg by mouth daily.    [provider]  meloxicam (MOBIC) 15 MG tablet Take 15 mg by mouth daily  as needed for pain. 07/03/17   [provider]  naproxen sodium (ALEVE) 220 MG tablet Take 220 mg by mouth daily as needed (pain).    [provider]  traZODone (DESYREL) 150 MG tablet Take 150 mg by mouth at bedtime.     [provider]  venlafaxine XR (EFFEXOR-XR) 150 MG 24 hr capsule Take 300 mg by mouth daily with breakfast.     [provider]    Family History Family History  Problem Relation Age of Onset  . Heart failure Mother     Social History Social History   Tobacco Use  . Smoking status: Never Smoker  . Smokeless tobacco: Never Used  . Tobacco comment: tried  smoking 3 days 2012  Substance Use Topics  . Alcohol use: No  . Drug use: No     Allergies   Acetaminophen; Other; Tramadol; and Hydrocodone   Review of Systems Review of Systems  Constitutional: Negative for fever.  HENT: Negative for congestion.   Eyes: Negative for redness.  Respiratory: Negative for shortness of breath.   Cardiovascular: Negative for chest pain.  Gastrointestinal: Positive for nausea. Negative for abdominal pain.  Genitourinary: Negative for difficulty urinating, dysuria, frequency and urgency.  Musculoskeletal: Positive for back pain.  Skin: Negative for rash.  Neurological: Positive for numbness. Negative for syncope.  Hematological: Does not bruise/bleed easily.  Psychiatric/Behavioral: Negative for confusion.     Physical Exam Updated Vital Signs BP (!) 152/74 (BP Location: Left Arm)   Pulse 70   Temp 98.4 F (36.9 C) (Oral)   Resp 20   SpO2 100%   Physical Exam   ED Treatments / Results  Labs (all labs ordered are listed, but only abnormal results are displayed) Labs Reviewed - No data to display  EKG None  Radiology No results found.  Procedures Procedures (including critical care time)  Medications Ordered in ED Medications - No data to display   Initial Impression / Assessment and Plan / ED Course  I have reviewed the triage vital signs and the nursing notes.  Pertinent labs & imaging results that were available during my care of the patient were reviewed by me and considered in my medical decision making (see chart for details).    Patient requested MRI.  Last one was in 2017.  She is having increased pain following the fall.  X-rays were reviewed from the seventh no acute bony abnormalities.  Based on her bilateral symptoms although not consistent with cauda equina.  Patient does have bilateral sciatica.  Did go ahead and order lumbar spine MRI without contrast.  It was available to be done.  Nurse came recently stating  patient now does not want the MRI.  She is to schedule follow-up with pain management.  Review of patient's symptoms show that they were present on September 7 as well.  No significant progression.  As mentioned in the HPI patient is under care management plan.  Patient is not to receive narcotics.   Final Clinical Impressions(s) / ED Diagnoses   Final diagnoses:  Chronic bilateral low back pain with bilateral sciatica    ED Discharge Orders    None       Vanetta MuldersZackowski, Yohance Hathorne, MD 09/28/17 1738

## 2017-10-31 ENCOUNTER — Encounter (HOSPITAL_COMMUNITY): Payer: Self-pay

## 2017-10-31 ENCOUNTER — Emergency Department (HOSPITAL_COMMUNITY)
Admission: EM | Admit: 2017-10-31 | Discharge: 2017-10-31 | Disposition: A | Discharge status: Home / Self Care | Payer: Medicare HMO | Attending: Emergency Medicine | Admitting: Emergency Medicine

## 2017-10-31 ENCOUNTER — Other Ambulatory Visit: Payer: Self-pay

## 2017-10-31 DIAGNOSIS — M542 Cervicalgia: Secondary | ICD-10-CM

## 2017-10-31 DIAGNOSIS — Z79899 Other long term (current) drug therapy: Secondary | ICD-10-CM | POA: Diagnosis not present

## 2017-10-31 DIAGNOSIS — G8929 Other chronic pain: Secondary | ICD-10-CM | POA: Insufficient documentation

## 2017-10-31 MED ORDER — METHOCARBAMOL 500 MG PO TABS: 750.0000 mg | ORAL_TABLET | Freq: Once | ORAL | Status: DC

## 2017-10-31 MED ORDER — DEXAMETHASONE SODIUM PHOSPHATE 10 MG/ML IJ SOLN: 10.0000 mg | Freq: Once | INTRAMUSCULAR | Status: DC

## 2017-10-31 MED ORDER — DEXAMETHASONE SODIUM PHOSPHATE 10 MG/ML IJ SOLN
10.0000 mg | Freq: Once | INTRAMUSCULAR | Status: AC
  Administered 2017-10-31: 10 mg via INTRAMUSCULAR
  Filled 2017-10-31: qty 1

## 2017-10-31 NOTE — Discharge Instructions (Addendum)
Evaluated today for neck pain.  Follow-up with your PCP for re-evaluation.  Return to the for any new or worsening symptoms.

## 2017-10-31 NOTE — ED Notes (Signed)
Pt left prior to signature for DC and DC vitals.

## 2017-10-31 NOTE — ED Notes (Signed)
Provided patient and son with two sodas

## 2017-10-31 NOTE — ED Triage Notes (Signed)
Patient c/o posterior neck pain x 4 days. Patient states it has gotten progressively worse. Patient denies any recent injury.

## 2017-10-31 NOTE — ED Provider Notes (Signed)
[facility]-EMERGENCY DEPT Provider Note   CSN: [id number] Arrival date & time: 10/31/17  4098   History   Chief Complaint Chief Complaint  Patient presents with  . Neck Pain    HPI Judith Hardy is a 47 y.o. female with past medical history significant for chronic pain, arthritis, osteoarthritis who presents for evaluation of neck pain x 4 days. Pain has been constant. Denies known trauma.Has had similar issues previously and was told to follow-up with her Pain clinic. Denies fever, chills, headache, nausea, vomiting, radiation of pain, numbness or tingling of extremities, weakness. Pain is worse with movement of the neck over the right shoulder. Pain is worse with palpation of the trapezius muscle. Has been taking Dilaudid, which she uses for her chronic pain. Denies known malignancy, IVDU, paresthesias or incontinence. Was evaluated for this issue last week with her PCP. Patient states she had negative plain films of her neck. Patient states she cannot take oral anti-inflammatories or oral muscle relaxer as "they make me sick as crap." States she also cannot use Toradol as " I think I had an allergic reaction to that last time." States she called her PCP today and they did not have an appointment and was told to proceed to the ED for evaluation. Does not want trigger point injection.  Patient does have a care plan.  HPI  Past Medical History:  Diagnosis Date  . Anxiety   . Arthritis   . Bursitis   . Chronic lower back pain   . Depression   . Osteoarthritis   . Pneumonia 12/16  . Sciatica     Patient Active Problem List   Diagnosis Date Noted  . Primary osteoarthritis of left hip 03/08/2015  . CAP (community acquired pneumonia) 12/24/2014  . Nausea & vomiting 12/24/2014  . Postoperative abdominal pain 05/27/2014  . HNP (herniated nucleus pulposus), lumbar 08/02/2011    Past Surgical History:  Procedure Laterality Date  . APPENDECTOMY  2011  . BACK  SURGERY    . HERNIA REPAIR  4/16   umbilical  . JOINT REPLACEMENT    . LUMBAR LAMINECTOMY/DECOMPRESSION MICRODISCECTOMY  01/10/2011   Procedure: LUMBAR LAMINECTOMY/DECOMPRESSION MICRODISCECTOMY;  Surgeon: Javier Docker;  Location: [facility];  Service: Orthopedics;  Laterality: N/A;  Decompression L4 - L5  (X-Ray)  . LUMBAR LAMINECTOMY/DECOMPRESSION MICRODISCECTOMY  08/02/2011   Procedure: LUMBAR LAMINECTOMY/DECOMPRESSION MICRODISCECTOMY;  Surgeon: Javier Docker, MD;  Location: [facility];  Service: Orthopedics;  Laterality: N/A;  Re-do Decompression L4-L5  . TOTAL HIP ARTHROPLASTY Left 03/08/2015   ANTERIOR APPROACH  . TOTAL HIP ARTHROPLASTY Left 03/08/2015   Procedure: TOTAL HIP ARTHROPLASTY ANTERIOR APPROACH;  Surgeon: Marcene Corning, MD;  Location: [facility];  Service: Orthopedics;  Laterality: Left;     OB History   None      Home Medications    Prior to Admission medications   Medication Sig Start Date End Date Taking? Authorizing Provider  clonazePAM (KLONOPIN) 0.5 MG tablet Take 0.5 mg by mouth every 8 (eight) hours.    Yes [provider]  gabapentin (NEURONTIN) 600 MG tablet Take 600 mg by mouth at bedtime.    Yes [provider]  Lysine 1000 MG TABS Take 3,000 mg by mouth daily.   Yes [provider]  naproxen sodium (ALEVE) 220 MG tablet Take 220 mg by mouth daily as needed (pain).   Yes [provider]  Oxycodone HCl 10 MG TABS Take 10 mg by mouth 3 (three) times daily.  10/29/17  Yes [provider]  traZODone (DESYREL) 150 MG tablet Take 150 mg by mouth at bedtime.    Yes [provider]  venlafaxine XR (EFFEXOR-XR) 150 MG 24 hr capsule Take 300 mg by mouth daily with breakfast.    Yes [provider]  etodolac (LODINE) 300 MG capsule Take 1 capsule (300 mg total) by mouth every 8 (eight) hours. Patient not taking: Reported on 08/22/2017 03/19/16   Linwood Dibbles, MD    Family History Family History  Problem Relation Age  of Onset  . Heart failure Mother     Social History Social History   Tobacco Use  . Smoking status: Never Smoker  . Smokeless tobacco: Never Used  . Tobacco comment: tried smoking 3 days 2012  Substance Use Topics  . Alcohol use: No  . Drug use: No     Allergies   Acetaminophen; Other; Tramadol; and Hydrocodone   Review of Systems Review of Systems  Constitutional: Negative.   Respiratory: Negative.   Cardiovascular: Negative.   Gastrointestinal: Negative.   Genitourinary: Negative.   Musculoskeletal: Positive for neck pain. Negative for arthralgias, back pain, gait problem, joint swelling and myalgias.  Skin: Negative.   Neurological: Negative.  Negative for dizziness.  All other systems reviewed and are negative.    Physical Exam Updated Vital Signs BP (!) 129/96 (BP Location: Right Arm)   Pulse 97   Temp 98.3 F (36.8 C) (Oral)   Resp 16   Ht 5\' 7"  (1.702 m)   Wt 99.8 kg   SpO2 97%   BMI 34.46 kg/m   Physical Exam   Physical Exam  Constitutional: Pt is oriented to person, place, and time. Appears well-developed and well-nourished. No distress.  HENT:  Head: Normocephalic and atraumatic.  Nose: Nose normal.  Mouth/Throat: Uvula is midline, oropharynx is clear and moist and mucous membranes are normal.  Eyes: Conjunctivae and EOM are normal. Pupils are equal, round, and reactive to light.  Neck: No spinous process tenderness and no muscular tenderness present. No rigidity. Normal range of motion present.  Full ROM, mild tenderness to chin over right shoulder. No midline cervical tenderness No crepitus, deformity or step-offs Right trapezius tenderness to palpation. Cardiovascular: Normal rate, regular rhythm and intact distal pulses.   Pulses:      Radial pulses are 2+ on the right side, and 2+ on the left side.       Dorsalis pedis pulses are 2+ on the right side, and 2+ on the left side.       Posterior tibial pulses are 2+ on the right side, and  2+ on the left side.  Pulmonary/Chest: Effort normal and breath sounds normal. No accessory muscle usage. No respiratory distress. No decreased breath sounds. No wheezes. No rhonchi. No rales. Exhibits no tenderness and no bony tenderness.  Equal chest expansion  Abdominal: Soft. Normal appearance and bowel sounds are normal. There is no tenderness. There is no rigidity, no guarding and no CVA tenderness.  Musculoskeletal: Normal range of motion.       Thoracic back: Exhibits normal range of motion.       Lumbar back: Exhibits normal range of motion.  Full range of motion of the T-spine and L-spine No tenderness to palpation of the spinous processes of the T-spine or L-spine No crepitus, deformity or step-offs Lymphadenopathy:    Pt has no cervical adenopathy.  Neurological: Pt is alert and oriented to person, place, and time. Normal reflexes. No cranial  nerve deficit. GCS eye subscore is 4. GCS verbal subscore is 5. GCS motor subscore is 6.  Reflex Scores:      Bicep reflexes are 2+ on the right side and 2+ on the left side.      Brachioradialis reflexes are 2+ on the right side and 2+ on the left side.      Patellar reflexes are 2+ on the right side and 2+ on the left side.      Achilles reflexes are 2+ on the right side and 2+ on the left side. Speech is clear and goal oriented, follows commands Normal 5/5 strength in upper and lower extremities bilaterally including dorsiflexion and plantar flexion, strong and equal grip strength Sensation normal to light and sharp touch Moves extremities without ataxia, coordination intact Normal gait and balance No Clonus    ED Treatments / Results  Labs (all labs ordered are listed, but only abnormal results are displayed) Labs Reviewed - No data to display  EKG None  Radiology No results found.  Procedures Procedures (including critical care time)  Medications Ordered in ED Medications  dexamethasone (DECADRON) injection 10 mg (10 mg  Intramuscular Given 10/31/17 1113)     Initial Impression / Assessment and Plan / ED Course  I have reviewed the triage vital signs and the nursing notes.  Pertinent labs & imaging results that were available during my care of the patient were reviewed by me and considered in my medical decision making (see chart for details).  47 year old female who appears otherwise well presents for evaluation of neck pain. Seen by PCP with negative films. Cannot take oral anti-inflammatories, or muscle relaxer. States last time she was here she had an allergic reaction to IM Toradol. Patient has a care plain in Epic. No midline tenderness and normal neuro exam with neuro deficits. No known injury to neck. Afebrile, non septic, non ill appearing. Will get plain film cervical spine as I cannot find her previous film and given her medication allergies will give dose of steroid. Patient is refusing trigger point injection.  Nursing notified that after her steroid injection patient is requesting to leave without obtaining films. On re-evaluation patient is able to move her neck without difficulty while walking around her exam room talking on the phone.  Physical exam is reassuring as she has no abnormalities in her neuro exam, has no midline tenderness and is without acute known trauma. Low suspicions for acute spinal cord pathology or boney pathology. No red flags for neck/back pain. Will dc home with strict return precautions. She is to follow-up with her PCP whom she has already seen for this issue.  Discussed with patient possible follow-up with neurosurgery, patient declines stating her primary care and pain management will "take care of this issue."  Patient stable for dc home at this time.    Final Clinical Impressions(s) / ED Diagnoses   Final diagnoses:  Neck pain    ED Discharge Orders    None       Ricca Melgarejo A, PA-C 10/31/17 1139    [name], MD 11/02/17 2116

## 2017-10-31 NOTE — ED Notes (Signed)
ED Provider at bedside. 

## 2018-04-15 ENCOUNTER — Telehealth (INDEPENDENT_AMBULATORY_CARE_PROVIDER_SITE_OTHER): Payer: Self-pay

## 2018-04-15 NOTE — Telephone Encounter (Signed)
Called patient and asked the screening questions.  Do you have now or have you had in the past 7 days a fever and/or chills? NO  Do you have now or have you had in the past 7 days a cough? NO  Do you have now or have you had in the last 7 days nausea, vomiting or abdominal pain? NO  Have you been exposed to anyone who has tested positive for COVID-19? NO  Have you or anyone who lives with you traveled within the last month? NO 

## 2018-04-16 ENCOUNTER — Ambulatory Visit (INDEPENDENT_AMBULATORY_CARE_PROVIDER_SITE_OTHER): Payer: Medicare HMO | Admitting: Orthopaedic Surgery

## 2018-04-17 ENCOUNTER — Ambulatory Visit (INDEPENDENT_AMBULATORY_CARE_PROVIDER_SITE_OTHER): Payer: Medicare HMO | Admitting: Orthopaedic Surgery

## 2018-04-17 ENCOUNTER — Encounter (INDEPENDENT_AMBULATORY_CARE_PROVIDER_SITE_OTHER): Payer: Self-pay | Admitting: Orthopaedic Surgery

## 2018-04-17 ENCOUNTER — Ambulatory Visit (INDEPENDENT_AMBULATORY_CARE_PROVIDER_SITE_OTHER): Payer: Medicare HMO

## 2018-04-17 ENCOUNTER — Other Ambulatory Visit: Payer: Self-pay

## 2018-04-17 DIAGNOSIS — M25552 Pain in left hip: Secondary | ICD-10-CM | POA: Diagnosis not present

## 2018-04-17 MED ORDER — METHYLPREDNISOLONE ACETATE 40 MG/ML IJ SUSP: 40.0000 mg | Freq: Once | INTRAMUSCULAR | Status: AC

## 2018-04-17 NOTE — Progress Notes (Signed)
Subjective: She is here for ultrasound-guided left hip greater trochanter injection.  Ongoing lateral pain status post replacement.  She went through physical therapy and is still doing home exercises.  She has had 2 injections previously with minimal improvement.  Objective: Exquisite tenderness palpation over the greater trochanter.  Limited diagnostic ultrasound: At the greater trochanter she has thickening of the gluteus medius tendon but no definite tears.  Procedure: Ultrasound-guided left greater trochanter injection: After sterile prep with Betadine, injected 8 cc 1% lidocaine without epinephrine and 40 mg methylprednisolone passing the needle under ultrasound guidance into the gluteus medius tendon at the greater trochanter.  She had excellent pain relief during the immediate anesthetic phase.  Follow-up as directed.

## 2018-04-17 NOTE — Progress Notes (Signed)
Office Visit Note   Patient: Judith Hardy           Date of Birth: May 29, 1970           MRN: [id number] Visit Date: 04/17/2018              Requested by: Maye Hides, PA [address] [place], [place] [address] PCP: Maye Hides, PA   Assessment & Plan: Visit Diagnoses:  1. Pain in left hip     Plan: Impression is lateral left hip pain.  We will have her see Dr. Prince Rome today for ultrasound-guided cortisone injection of her bursa as well as evaluation of her abductors as I suspect that she may have tendinopathy also.  If this does not improve we may need to consider a [facility] MRI to evaluate for structural abnormalities with possible consideration of bursectomy.  Follow-Up Instructions: Return if symptoms worsen or fail to improve.   Orders:  Orders Placed This Encounter  Procedures  . XR HIP UNILAT W OR W/O PELVIS 2-3 VIEWS LEFT   No orders of the defined types were placed in this encounter.     Procedures: No procedures performed   Clinical Data: No additional findings.   Subjective: Chief Complaint  Patient presents with  . Left Hip - Pain    Ms. Judith Hardy is a very pleasant 48 year old female who comes in today for evaluation of her left hip pain.  She has chronic low back pain and is currently in [facility] for chronic pain management.  She is on disability for chronic back pain and she takes Belbuca for her pain.  She is status post left total hip replacement 2017 by Dr. Jerl Santos and sounds like she did well from this and did not have any postoperative complications.  She has had lateral hip pain for quite some time and she has had 3 cortisone injections without significant relief as well as physical therapy without significant relief.  She does have chronic back pain and occasional sciatica.   Review of Systems  Constitutional: Negative.   HENT: Negative.   Eyes: Negative.   Respiratory: Negative.   Cardiovascular: Negative.   Endocrine:  Negative.   Musculoskeletal: Negative.   Neurological: Negative.   Hematological: Negative.   Psychiatric/Behavioral: Negative.   All other systems reviewed and are negative.    Objective: Vital Signs: There were no vitals taken for this visit.  Physical Exam Vitals signs and nursing note reviewed.  Constitutional:      Appearance: She is well-developed.  HENT:     Head: Normocephalic and atraumatic.  Neck:     Musculoskeletal: Neck supple.  Pulmonary:     Effort: Pulmonary effort is normal.  Abdominal:     Palpations: Abdomen is soft.  Skin:    General: Skin is warm.     Capillary Refill: Capillary refill takes less than 2 seconds.  Neurological:     Mental Status: She is alert and oriented to person, place, and time.  Psychiatric:        Behavior: Behavior normal.        Thought Content: Thought content normal.        Judgment: Judgment normal.     Ortho Exam Left hip exam shows fully healed surgical scar.  She is exquisitely tender over the lateral aspect of the hip.  She does have pain with resisted hip abduction.  She does not have any significant pain with logroll or range of motion. Specialty  Comments:  No specialty comments available.  Imaging: Xr Hip Unilat W Or W/o Pelvis 2-3 Views Left  Result Date: 04/17/2018 Stable left total hip replacement without any obvious evidence of complications.    PMFS History: Patient Active Problem List   Diagnosis Date Noted  . Primary osteoarthritis of left hip 03/08/2015  . CAP (community acquired pneumonia) 12/24/2014  . Nausea & vomiting 12/24/2014  . Postoperative abdominal pain 05/27/2014  . HNP (herniated nucleus pulposus), lumbar 08/02/2011   Past Medical History:  Diagnosis Date  . Anxiety   . Arthritis   . Bursitis   . Chronic lower back pain   . Depression   . Osteoarthritis   . Pneumonia 12/16  . Sciatica     Family History  Problem Relation Age of Onset  . Heart failure Mother     Past  Surgical History:  Procedure Laterality Date  . APPENDECTOMY  2011  . BACK SURGERY    . HERNIA REPAIR  4/16   umbilical  . JOINT REPLACEMENT    . LUMBAR LAMINECTOMY/DECOMPRESSION MICRODISCECTOMY  01/10/2011   Procedure: LUMBAR LAMINECTOMY/DECOMPRESSION MICRODISCECTOMY;  Surgeon: [name];  Location: [facility];  Service: Orthopedics;  Laterality: N/A;  Decompression L4 - L5  (X-Ray)  . LUMBAR LAMINECTOMY/DECOMPRESSION MICRODISCECTOMY  08/02/2011   Procedure: LUMBAR LAMINECTOMY/DECOMPRESSION MICRODISCECTOMY;  Surgeon: [name], MD;  Location: [facility];  Service: Orthopedics;  Laterality: N/A;  Re-do Decompression L4-L5  . TOTAL HIP ARTHROPLASTY Left 03/08/2015   ANTERIOR APPROACH  . TOTAL HIP ARTHROPLASTY Left 03/08/2015   Procedure: TOTAL HIP ARTHROPLASTY ANTERIOR APPROACH;  Surgeon: [name], MD;  Location: [facility];  Service: Orthopedics;  Laterality: Left;   Social History   Occupational History  . Not on file  Tobacco Use  . Smoking status: Never Smoker  . Smokeless tobacco: Never Used  . Tobacco comment: tried smoking 3 days 2012  Substance and Sexual Activity  . Alcohol use: No  . Drug use: No  . Sexual activity: Yes

## 2018-05-26 ENCOUNTER — Telehealth: Payer: Self-pay | Admitting: Orthopaedic Surgery

## 2018-05-26 NOTE — Telephone Encounter (Signed)
Patient called in stating last injection didn't work it only lasted 5 days wants to know if she should get another injection or what other options she has

## 2018-05-27 NOTE — Telephone Encounter (Signed)
Lets have her come back in

## 2018-05-27 NOTE — Telephone Encounter (Signed)
See message below °

## 2018-05-28 ENCOUNTER — Telehealth: Payer: Self-pay | Admitting: Orthopaedic Surgery

## 2018-05-28 NOTE — Telephone Encounter (Signed)
Please make appt for patient. Thanks.

## 2018-05-28 NOTE — Telephone Encounter (Signed)
Called patient left message to return call to schedule an appointment with Dr Roda Shutters     (Injection did not work)

## 2018-05-29 NOTE — Telephone Encounter (Signed)
Patient is scheduled 06/05/2018 at 9:30am

## 2018-06-05 ENCOUNTER — Ambulatory Visit: Payer: Medicare HMO | Admitting: Orthopaedic Surgery

## 2018-06-11 ENCOUNTER — Encounter: Payer: Self-pay | Admitting: Orthopaedic Surgery

## 2018-06-11 ENCOUNTER — Other Ambulatory Visit: Payer: Self-pay

## 2018-06-11 ENCOUNTER — Ambulatory Visit (INDEPENDENT_AMBULATORY_CARE_PROVIDER_SITE_OTHER): Payer: Medicare HMO | Admitting: Orthopaedic Surgery

## 2018-06-11 DIAGNOSIS — M7062 Trochanteric bursitis, left hip: Secondary | ICD-10-CM | POA: Diagnosis not present

## 2018-06-11 NOTE — Progress Notes (Signed)
Office Visit Note   Patient: Judith Hardy           Date of Birth: 03/04/1970           MRN: [id number] Visit Date: 06/11/2018              Requested by: Maye HidesMiller, Ryan Dean, PA [address] [place], [place] [id number] PCP: Maye HidesMiller, Ryan Dean, PA   Assessment & Plan: Visit Diagnoses:  1. Trochanteric bursitis, left hip     Plan: Impression is chronic left hip pain suspect recalcitrant bursitis or abductor tendinosis.  Due to the patient's underlying total hip arthroplasty, we will order a MARS MRI to further assess for structural abnormalities.  She will follow-up with us once that has been completed.  She will call with concerns or questions in the meantime.  Follow-Up Instructions: Return in about 2 weeks (around 06/25/2018) for discuss MRI left hip.   Orders:  Orders Placed This Encounter  Procedures  . MR Hip Left w/o contrast   No orders of the defined types were placed in this encounter.     Procedures: No procedures performed   Clinical Data: No additional findings.   Subjective: Chief Complaint  Patient presents with  . Left Hip - Pain    HPI patient is a pleasant 48 year old female who presents our clinic today with recurrent left lateral hip pain.  She is status post left total hip replacement by Dr. Marlinda Mikeallas in 2017.  She did very well with that.  She has been dealing with lower back pain and left-sided trochanteric bursitis for a while now.  She has had multiple injections to the trochanteric bursa as well as attended formal physical therapy.  She has most recently been seen by Dr. Prince Romehilts for an ultrasound-guided trochanteric bursa injection which significantly helped, but only lasted for about 5 days.  She is here today with recurrent left lateral hip pain.  Pain appears to be worse lying on the left side, going up and down stairs or when she is putting on her shoes.  She has been taking narcotic pain medication prescribed by pain clinic.  Review of Systems as  detailed in HPI.  All others reviewed and are negative.   Objective: Vital Signs: There were no vitals taken for this visit.  Physical Exam well-developed and well-nourished female in no acute distress.  Alert and oriented x3.  Ortho Exam examination of her left hip reveals exquisite tenderness over the greater trochanter.  Increased pain with external rotation, resisted abduction and abduction of the hip.  She is neurovascularly intact distally.  Specialty Comments:  No specialty comments available.  Imaging: No new imaging   PMFS History: Patient Active Problem List   Diagnosis Date Noted  . Trochanteric bursitis, left hip 06/11/2018  . Primary osteoarthritis of left hip 03/08/2015  . CAP (community acquired pneumonia) 12/24/2014  . Nausea & vomiting 12/24/2014  . Postoperative abdominal pain 05/27/2014  . HNP (herniated nucleus pulposus), lumbar 08/02/2011   Past Medical History:  Diagnosis Date  . Anxiety   . Arthritis   . Bursitis   . Chronic lower back pain   . Depression   . Osteoarthritis   . Pneumonia 12/16  . Sciatica     Family History  Problem Relation Age of Onset  . Heart failure Mother     Past Surgical History:  Procedure Laterality Date  . APPENDECTOMY  2011  . BACK SURGERY    . HERNIA REPAIR  4/16  umbilical  . JOINT REPLACEMENT    . LUMBAR LAMINECTOMY/DECOMPRESSION MICRODISCECTOMY  01/10/2011   Procedure: LUMBAR LAMINECTOMY/DECOMPRESSION MICRODISCECTOMY;  Surgeon: Javier Docker;  Location: [facility];  Service: Orthopedics;  Laterality: N/A;  Decompression L4 - L5  (X-Ray)  . LUMBAR LAMINECTOMY/DECOMPRESSION MICRODISCECTOMY  08/02/2011   Procedure: LUMBAR LAMINECTOMY/DECOMPRESSION MICRODISCECTOMY;  Surgeon: Javier Docker, MD;  Location: [facility];  Service: Orthopedics;  Laterality: N/A;  Re-do Decompression L4-L5  . TOTAL HIP ARTHROPLASTY Left 03/08/2015   ANTERIOR APPROACH  . TOTAL HIP ARTHROPLASTY Left 03/08/2015   Procedure: TOTAL HIP  ARTHROPLASTY ANTERIOR APPROACH;  Surgeon: Marcene Corning, MD;  Location: [facility];  Service: Orthopedics;  Laterality: Left;   Social History   Occupational History  . Not on file  Tobacco Use  . Smoking status: Never Smoker  . Smokeless tobacco: Never Used  . Tobacco comment: tried smoking 3 days 2012  Substance and Sexual Activity  . Alcohol use: No  . Drug use: No  . Sexual activity: Yes

## 2018-07-04 ENCOUNTER — Other Ambulatory Visit: Payer: Medicare HMO

## 2018-07-08 ENCOUNTER — Other Ambulatory Visit: Payer: Medicare HMO

## 2018-07-09 ENCOUNTER — Ambulatory Visit: Payer: Medicare HMO | Admitting: Orthopaedic Surgery

## 2018-07-24 ENCOUNTER — Other Ambulatory Visit: Payer: Medicare HMO

## 2018-07-26 ENCOUNTER — Other Ambulatory Visit: Payer: Medicare HMO

## 2018-07-28 ENCOUNTER — Other Ambulatory Visit: Payer: Medicare HMO

## 2018-07-30 ENCOUNTER — Ambulatory Visit
Admission: RE | Admit: 2018-07-30 | Discharge: 2018-07-30 | Disposition: A | Discharge status: Home / Self Care | Payer: Medicare HMO | Source: Ambulatory Visit | Attending: Physician Assistant | Admitting: Physician Assistant

## 2018-07-30 ENCOUNTER — Other Ambulatory Visit: Payer: Self-pay

## 2018-07-30 DIAGNOSIS — M7062 Trochanteric bursitis, left hip: Secondary | ICD-10-CM

## 2018-08-01 ENCOUNTER — Ambulatory Visit (INDEPENDENT_AMBULATORY_CARE_PROVIDER_SITE_OTHER): Payer: Medicare HMO | Admitting: Orthopaedic Surgery

## 2018-08-01 ENCOUNTER — Encounter: Payer: Self-pay | Admitting: Physician Assistant

## 2018-08-01 DIAGNOSIS — M25552 Pain in left hip: Secondary | ICD-10-CM

## 2018-08-01 NOTE — Progress Notes (Signed)
Office Visit Note   Patient: Judith PatrickSetaria Hardy           Date of Birth: 04/16/1970           MRN: [id number] Visit Date: 08/01/2018              Requested by: Maye HidesMiller, Ryan Dean, PA [address] [place],  [place] [phone] PCP: Maye HidesMiller, Ryan Dean, PA   Assessment & Plan: Visit Diagnoses:  1. Pain in left hip     Plan: Impression is left hip pain of unknown etiology.  MARS MRI of the left hip showed no loosening or abnormalities of her previous total hip arthroplasty or any other structural defects.  She notes that the previous greater trochanter injection by Dr. Prince Romehilts brought her pain level from a 10 out of 10 to a 5 out of 10.  She would like to proceed with another greater trochanter injection with Dr. Prince Romehilts as she is certain her pain is coming from this area.  I also discussed sending her to formal physical therapy as I think her back could be contributing to her pain.  She was adamant on trying the cortisone injection first.  She was unsatisfied with her appointment today as I did not have anything else to offer her.  Follow-Up Instructions: Return for with Dr. Prince RomeHilts .   Orders:  No orders of the defined types were placed in this encounter.  No orders of the defined types were placed in this encounter.     Procedures: No procedures performed   Clinical Data: No additional findings.   Subjective: Chief Complaint  Patient presents with  . Left Hip - Follow-up    MRI review of the left hip    HPI patient is a 48 year old female who presents to our clinic today to discuss MARS MRI of the left hip.  She is status post left anterior hip replacement by Dr. Jerl Santosalldorf as well as to remote lumbar spine fusions by Dr. Shelle IronBeane several years back.  She has been seeing us for continued left lower back pain and left lateral hip pain for the past several months.  She has had a total of 2-3 cortisone injections into the greater trochanter with the last one being under ultrasound by Dr. Prince Romehilts.  It  was noted at that point that she had thickening to the gluteus medius.  She has also tried formal physical therapy.  MRI of the left hip reviewed with the patient which showed no structural abnormalities.  Review of Systems as detailed in HPI.  All others reviewed and are negative.   Objective: Vital Signs: There were no vitals taken for this visit.  Physical Exam well-developed well-nourished female no acute distress.  Alert and oriented x3.  Ortho Exam stable left hip exam  Specialty Comments:  No specialty comments available.  Imaging: No new imaging   PMFS History: Patient Active Problem List   Diagnosis Date Noted  . Trochanteric bursitis, left hip 06/11/2018  . Primary osteoarthritis of left hip 03/08/2015  . CAP (community acquired pneumonia) 12/24/2014  . Nausea & vomiting 12/24/2014  . Postoperative abdominal pain 05/27/2014  . HNP (herniated nucleus pulposus), lumbar 08/02/2011   Past Medical History:  Diagnosis Date  . Anxiety   . Arthritis   . Bursitis   . Chronic lower back pain   . Depression   . Osteoarthritis   . Pneumonia 12/16  . Sciatica     Family History  Problem Relation Age of  Onset  . Heart failure Mother     Past Surgical History:  Procedure Laterality Date  . APPENDECTOMY  2011  . BACK SURGERY    . HERNIA REPAIR  4/03   umbilical  . JOINT REPLACEMENT    . LUMBAR LAMINECTOMY/DECOMPRESSION MICRODISCECTOMY  01/10/2011   Procedure: LUMBAR LAMINECTOMY/DECOMPRESSION MICRODISCECTOMY;  Surgeon: Johnn Hai;  Location: [facility];  Service: Orthopedics;  Laterality: N/A;  Decompression L4 - L5  (X-Ray)  . LUMBAR LAMINECTOMY/DECOMPRESSION MICRODISCECTOMY  08/02/2011   Procedure: LUMBAR LAMINECTOMY/DECOMPRESSION MICRODISCECTOMY;  Surgeon: Johnn Hai, MD;  Location: [facility];  Service: Orthopedics;  Laterality: N/A;  Re-do Decompression L4-L5  . TOTAL HIP ARTHROPLASTY Left 03/08/2015   ANTERIOR APPROACH  . TOTAL HIP ARTHROPLASTY Left 03/08/2015    Procedure: TOTAL HIP ARTHROPLASTY ANTERIOR APPROACH;  Surgeon: Melrose Nakayama, MD;  Location: [facility];  Service: Orthopedics;  Laterality: Left;   Social History   Occupational History  . Not on file  Tobacco Use  . Smoking status: Never Smoker  . Smokeless tobacco: Never Used  . Tobacco comment: tried smoking 3 days 2012  Substance and Sexual Activity  . Alcohol use: No  . Drug use: No  . Sexual activity: Yes

## 2018-08-11 ENCOUNTER — Other Ambulatory Visit: Payer: Medicare HMO

## 2019-09-02 ENCOUNTER — Other Ambulatory Visit: Payer: Self-pay | Admitting: Pain Medicine

## 2019-09-02 DIAGNOSIS — M542 Cervicalgia: Secondary | ICD-10-CM

## 2019-09-02 DIAGNOSIS — M79601 Pain in right arm: Secondary | ICD-10-CM

## 2019-09-02 DIAGNOSIS — G8929 Other chronic pain: Secondary | ICD-10-CM

## 2019-09-02 DIAGNOSIS — R200 Anesthesia of skin: Secondary | ICD-10-CM

## 2020-05-25 LAB — EXTERNAL GENERIC LAB PROCEDURE: COLOGUARD: NEGATIVE

## 2020-07-09 ENCOUNTER — Encounter (HOSPITAL_BASED_OUTPATIENT_CLINIC_OR_DEPARTMENT_OTHER): Payer: Self-pay | Admitting: Emergency Medicine

## 2020-07-09 ENCOUNTER — Emergency Department (HOSPITAL_BASED_OUTPATIENT_CLINIC_OR_DEPARTMENT_OTHER): Payer: Medicare HMO

## 2020-07-09 ENCOUNTER — Other Ambulatory Visit: Payer: Self-pay

## 2020-07-09 ENCOUNTER — Emergency Department (HOSPITAL_BASED_OUTPATIENT_CLINIC_OR_DEPARTMENT_OTHER)
Admission: EM | Admit: 2020-07-09 | Discharge: 2020-07-09 | Disposition: A | Discharge status: Home / Self Care | Payer: Medicare HMO | Attending: Emergency Medicine | Admitting: Emergency Medicine

## 2020-07-09 DIAGNOSIS — M549 Dorsalgia, unspecified: Secondary | ICD-10-CM | POA: Diagnosis not present

## 2020-07-09 DIAGNOSIS — Z96642 Presence of left artificial hip joint: Secondary | ICD-10-CM | POA: Diagnosis not present

## 2020-07-09 DIAGNOSIS — S99921A Unspecified injury of right foot, initial encounter: Secondary | ICD-10-CM

## 2020-07-09 DIAGNOSIS — X58XXXA Exposure to other specified factors, initial encounter: Secondary | ICD-10-CM | POA: Insufficient documentation

## 2020-07-09 DIAGNOSIS — G8929 Other chronic pain: Secondary | ICD-10-CM | POA: Insufficient documentation

## 2020-07-09 NOTE — ED Notes (Signed)
Pt provided discharge instructions and prescription information. Pt was given the opportunity to ask questions and questions were answered. Discharge signature not obtained in the setting of the COVID-19 pandemic in order to reduce high touch surfaces.  ° °

## 2020-07-09 NOTE — ED Triage Notes (Signed)
Pt reports pain to RT foot since yesterday; sts heard pop while she was walking, no injury

## 2020-07-09 NOTE — Discharge Instructions (Signed)
Please read and follow all provided instructions.  Your diagnoses today include:  1. Injury of right foot, initial encounter     Tests performed today include: An x-ray of the affected area - does NOT show any broken bones Vital signs. See below for your results today.   Medications prescribed:  None  Take any prescribed medications only as directed.  Home care instructions:  Follow any educational materials contained in this packet Follow R.I.C.E. Protocol: R - rest your injury  I  - use ice on injury without applying directly to skin C - compress injury with bandage or splint E - elevate the injury as much as possible  Follow-up instructions: Please follow-up with your primary care provider or the provided orthopedic physician (bone specialist) if you continue to have significant pain in 1 week. In this case you may have a more severe injury that requires further care.   Return instructions:  Please return if your toes or feet are numb or tingling, appear gray or blue, or you have severe pain (also elevate the leg and loosen splint or wrap if you were given one) Please return to the Emergency Department if you experience worsening symptoms.  Please return if you have any other emergent concerns.  Additional Information:  Your vital signs today were: BP (!) 114/51 (BP Location: Left Arm)   Pulse 76   Temp 99.2 F (37.3 C) (Oral)   Resp 16   Ht 5\' 7"  (1.702 m)   Wt 97.5 kg   SpO2 99%   BMI 33.67 kg/m  If your blood pressure (BP) was elevated above 135/85 this visit, please have this repeated by your doctor within one month.

## 2020-07-09 NOTE — ED Provider Notes (Signed)
[facility] EMERGENCY DEPARTMENT Provider Note   CSN: [id number] Arrival date & time: 07/09/20  1714     History Chief Complaint  Patient presents with   Foot Pain    Judith Hardy is a 50 y.o. female.  Patient presents to the emergency department today for evaluation of cute onset of right foot pain and swelling occurring yesterday around 11 AM.  Patient states that she has chronic back pain and decree sensation in her feet.  Yesterday her foot was asleep and she stood up from a sitting position and stepped down on the floor.  She felt and heard a pop in the foot.  She had immediate pain on the lateral aspect of the foot as well as swelling.  She is able to walk but bearing weight causes more pain.  No other injuries.      Past Medical History:  Diagnosis Date   Anxiety    Arthritis    Bursitis    Chronic lower back pain    Depression    Osteoarthritis    Pneumonia 12/16   Sciatica     Patient Active Problem List   Diagnosis Date Noted   Trochanteric bursitis, left hip 06/11/2018   Primary osteoarthritis of left hip 03/08/2015   CAP (community acquired pneumonia) 12/24/2014   Nausea & vomiting 12/24/2014   Postoperative abdominal pain 05/27/2014   HNP (herniated nucleus pulposus), lumbar 08/02/2011    Past Surgical History:  Procedure Laterality Date   APPENDECTOMY  2011   BACK SURGERY     HERNIA REPAIR  4/16   umbilical   JOINT REPLACEMENT     LUMBAR LAMINECTOMY/DECOMPRESSION MICRODISCECTOMY  01/10/2011   Procedure: LUMBAR LAMINECTOMY/DECOMPRESSION MICRODISCECTOMY;  Surgeon: Javier Docker;  Location: [facility];  Service: Orthopedics;  Laterality: N/A;  Decompression L4 - L5  (X-Ray)   LUMBAR LAMINECTOMY/DECOMPRESSION MICRODISCECTOMY  08/02/2011   Procedure: LUMBAR LAMINECTOMY/DECOMPRESSION MICRODISCECTOMY;  Surgeon: Javier Docker, MD;  Location: [facility];  Service: Orthopedics;  Laterality: N/A;  Re-do Decompression L4-L5   TOTAL HIP ARTHROPLASTY Left  03/08/2015   ANTERIOR APPROACH   TOTAL HIP ARTHROPLASTY Left 03/08/2015   Procedure: TOTAL HIP ARTHROPLASTY ANTERIOR APPROACH;  Surgeon: Marcene Corning, MD;  Location: [facility];  Service: Orthopedics;  Laterality: Left;     OB History   No obstetric history on file.     Family History  Problem Relation Age of Onset   Heart failure Mother     Social History   Tobacco Use   Smoking status: Never   Smokeless tobacco: Never   Tobacco comments:    tried smoking 3 days 2012  Vaping Use   Vaping Use: Never used  Substance Use Topics   Alcohol use: No   Drug use: No    Home Medications Prior to Admission medications   Medication Sig Start Date End Date Taking? Authorizing Provider  clonazePAM (KLONOPIN) 0.5 MG tablet Take 0.5 mg by mouth every 8 (eight) hours.     [provider]  etodolac (LODINE) 300 MG capsule Take 1 capsule (300 mg total) by mouth every 8 (eight) hours. Patient not taking: Reported on 08/22/2017 03/19/16   Linwood Dibbles, MD  gabapentin (NEURONTIN) 600 MG tablet Take 600 mg by mouth at bedtime.     [provider]  Lysine 1000 MG TABS Take 3,000 mg by mouth daily.    [provider]  naproxen sodium (ALEVE) 220 MG tablet Take 220 mg by mouth daily as needed (pain).  [provider]  Oxycodone HCl 10 MG TABS Take 10 mg by mouth 3 (three) times daily.  10/29/17   [provider]  traZODone (DESYREL) 150 MG tablet Take 150 mg by mouth at bedtime.     [provider]  venlafaxine XR (EFFEXOR-XR) 150 MG 24 hr capsule Take 300 mg by mouth daily with breakfast.     [provider]    Allergies    Acetaminophen, Other, Tramadol, and Hydrocodone  Review of Systems   Review of Systems  Constitutional:  Negative for activity change.  Musculoskeletal:  Positive for arthralgias, gait problem and joint swelling. Negative for back pain and neck pain.  Skin:  Negative for wound.  Neurological:  Negative for  weakness and numbness.   Physical Exam Updated Vital Signs BP (!) 114/51 (BP Location: Left Arm)   Pulse 76   Temp 99.2 F (37.3 C) (Oral)   Resp 16   Ht 5\' 7"  (1.702 m)   Wt 97.5 kg   SpO2 99%   BMI 33.67 kg/m   Physical Exam Vitals and nursing note reviewed.  Constitutional:      Appearance: She is well-developed.  HENT:     Head: Normocephalic and atraumatic.  Eyes:     Pupils: Pupils are equal, round, and reactive to light.  Cardiovascular:     Pulses: Normal pulses. No decreased pulses.  Musculoskeletal:        General: Swelling and tenderness present.     Cervical back: Normal range of motion and neck supple.     Comments: Patient with generalized swelling and tenderness over the dorsum of the foot.  Pain is worse laterally over the base of the fourth and fifth metatarsals.  No ankle pain or swelling.  Skin:    General: Skin is warm and dry.  Neurological:     Mental Status: She is alert.     Sensory: No sensory deficit.     Comments: Motor, sensation, and vascular distal to the injury is fully intact.  Reports decreased sensation in the sole of her right foot.  Psychiatric:        Mood and Affect: Mood normal.    ED Results / Procedures / Treatments   Labs (all labs ordered are listed, but only abnormal results are displayed) Labs Reviewed - No data to display  EKG None  Radiology No results found.  Procedures Procedures   Medications Ordered in ED Medications - No data to display  ED Course  I have reviewed the triage vital signs and the nursing notes.  Pertinent labs & imaging results that were available during my care of the patient were reviewed by me and considered in my medical decision making (see chart for details).  Patient seen and examined. X-ray ordered.   Vital signs reviewed and are as follows: BP (!) 114/51 (BP Location: Left Arm)   Pulse 76   Temp 99.2 F (37.3 C) (Oral)   Resp 16   Ht 5\' 7"  (1.702 m)   Wt 97.5 kg   SpO2  99%   BMI 33.67 kg/m   7:20 PM x-ray negative.  Reviewed personally.  Patient will be given postop shoe and Ace wrap.  Discussed rice protocol.  She will use home medications and follow-up with her orthopedic doctor if symptoms are not improving over the next 1 to 2 weeks.     MDM Rules/Calculators/A&P  Patient with foot injury and swelling.  Negative imaging.  Lower extremity and foot are neurovascularly intact.   Final Clinical Impression(s) / ED Diagnoses Final diagnoses:  Injury of right foot, initial encounter    Rx / DC Orders ED Discharge Orders     None        Renne Crigler, PA-C 07/09/20 1920    Maia Plan, MD 07/12/20 [phone]

## 2022-10-10 ENCOUNTER — Other Ambulatory Visit: Payer: Self-pay

## 2022-10-10 ENCOUNTER — Encounter (HOSPITAL_BASED_OUTPATIENT_CLINIC_OR_DEPARTMENT_OTHER): Payer: Self-pay | Admitting: Pediatrics

## 2022-10-10 ENCOUNTER — Emergency Department (HOSPITAL_BASED_OUTPATIENT_CLINIC_OR_DEPARTMENT_OTHER)
Admission: EM | Admit: 2022-10-10 | Discharge: 2022-10-10 | Disposition: A | Discharge status: Home / Self Care | Payer: Medicare HMO

## 2022-10-10 DIAGNOSIS — M7022 Olecranon bursitis, left elbow: Secondary | ICD-10-CM | POA: Insufficient documentation

## 2022-10-10 DIAGNOSIS — Y939 Activity, unspecified: Secondary | ICD-10-CM | POA: Diagnosis not present

## 2022-10-10 DIAGNOSIS — M25522 Pain in left elbow: Secondary | ICD-10-CM | POA: Diagnosis present

## 2022-10-10 NOTE — Discharge Instructions (Signed)
Follow up with orthopedics, please call to schedule an appointment.

## 2022-10-10 NOTE — ED Triage Notes (Signed)
C/o left elbow pain after a fall 3 months ago,

## 2022-10-10 NOTE — ED Provider Notes (Signed)
[facility] Provider Note   CSN: [id number] Arrival date & time: 10/10/22  1536     History  Chief Complaint  Patient presents with   Elbow Pain    Judith Hardy is a 52 y.o. female.  52 year old female presents with left elbow pain and swelling. Patient states she tripped and fell on the sidewalk 3 months ago. Wounds have healed but continues to have pain and swelling along the olecranon. No other injuries.        Home Medications Prior to Admission medications   Medication Sig Start Date End Date Taking? Authorizing Provider  clonazePAM (KLONOPIN) 0.5 MG tablet Take 0.5 mg by mouth every 8 (eight) hours.     [provider]  etodolac (LODINE) 300 MG capsule Take 1 capsule (300 mg total) by mouth every 8 (eight) hours. Patient not taking: Reported on 08/22/2017 03/19/16   Linwood Dibbles, MD  gabapentin (NEURONTIN) 600 MG tablet Take 600 mg by mouth at bedtime.     [provider]  Lysine 1000 MG TABS Take 3,000 mg by mouth daily.    [provider]  naproxen sodium (ALEVE) 220 MG tablet Take 220 mg by mouth daily as needed (pain).    [provider]  Oxycodone HCl 10 MG TABS Take 10 mg by mouth 3 (three) times daily.  10/29/17   [provider]  traZODone (DESYREL) 150 MG tablet Take 150 mg by mouth at bedtime.     [provider]  venlafaxine XR (EFFEXOR-XR) 150 MG 24 hr capsule Take 300 mg by mouth daily with breakfast.     [provider]      Allergies    Acetaminophen, Other, Tramadol, and Hydrocodone    Review of Systems   Review of Systems Negative except as per HPI Physical Exam Updated Vital Signs BP (!) 164/108 (BP Location: Right Arm)   Pulse (!) 105   Temp 99.2 F (37.3 C)   Resp 18   Ht 5\' 8"  (1.727 m)   Wt 86.2 kg   SpO2 99%   BMI 28.89 kg/m  Physical Exam Vitals and nursing note reviewed.  Constitutional:      General: She is not in acute  distress.    Appearance: She is well-developed. She is not diaphoretic.  HENT:     Head: Normocephalic and atraumatic.  Cardiovascular:     Pulses: Normal pulses.  Pulmonary:     Effort: Pulmonary effort is normal.  Musculoskeletal:     Left elbow: Normal range of motion. Tenderness present in olecranon process.     Comments: Tender/swollen left olecranon bursa without overlying erythema   Skin:    General: Skin is warm and dry.     Findings: No bruising, erythema or rash.  Neurological:     Mental Status: She is alert and oriented to person, place, and time.  Psychiatric:        Behavior: Behavior normal.     ED Results / Procedures / Treatments   Labs (all labs ordered are listed, but only abnormal results are displayed) Labs Reviewed - No data to display  EKG None  Radiology No results found.  Procedures Procedures    Medications Ordered in ED Medications - No data to display  ED Course/ Medical Decision Making/ A&P  Medical Decision Making  52 year old female with pain and swelling along olecranon process after fall 3 months ago. Normal ROM of elbow/wrist. Suspect bursitis. No overlying erythema. Recommend follow up with ortho, provided with referral.         Final Clinical Impression(s) / ED Diagnoses Final diagnoses:  Olecranon bursitis of left elbow    Rx / DC Orders ED Discharge Orders     None         Jeannie Fend, PA-C 10/10/22 1617    Coral Spikes, DO 10/10/22 2237
